# Patient Record
Sex: Male | Born: 1940
Health system: Southern US, Community
[De-identification: ages and names within clinical notes are randomized; demographics above are authoritative.]

## PROBLEM LIST (undated history)

## (undated) DIAGNOSIS — I2510 Atherosclerotic heart disease of native coronary artery without angina pectoris: Secondary | ICD-10-CM

## (undated) DIAGNOSIS — Z8711 Personal history of peptic ulcer disease: Secondary | ICD-10-CM

## (undated) DIAGNOSIS — M1990 Unspecified osteoarthritis, unspecified site: Secondary | ICD-10-CM

## (undated) DIAGNOSIS — K219 Gastro-esophageal reflux disease without esophagitis: Secondary | ICD-10-CM

## (undated) DIAGNOSIS — I10 Essential (primary) hypertension: Secondary | ICD-10-CM

## (undated) DIAGNOSIS — IMO0001 Reserved for inherently not codable concepts without codable children: Secondary | ICD-10-CM

## (undated) DIAGNOSIS — I639 Cerebral infarction, unspecified: Secondary | ICD-10-CM

## (undated) DIAGNOSIS — I6529 Occlusion and stenosis of unspecified carotid artery: Secondary | ICD-10-CM

## (undated) DIAGNOSIS — E119 Type 2 diabetes mellitus without complications: Secondary | ICD-10-CM

## (undated) DIAGNOSIS — W1781XA Fall down embankment (hill), initial encounter: Secondary | ICD-10-CM

## (undated) HISTORY — DX: Occlusion and stenosis of unspecified carotid artery: I65.29

## (undated) HISTORY — DX: Fall down embankment (hill), initial encounter: W17.81XA

## (undated) HISTORY — DX: Unspecified osteoarthritis, unspecified site: M19.90

## (undated) HISTORY — PX: JOINT REPLACEMENT: SHX530

## (undated) HISTORY — DX: Cerebral infarction, unspecified: I63.9

---

## 1999-07-07 HISTORY — PX: CORONARY ARTERY BYPASS GRAFT: SHX141

## 1999-07-09 ENCOUNTER — Inpatient Hospital Stay (HOSPITAL_COMMUNITY): Admission: EM | Admit: 1999-07-09 | Discharge: 1999-07-16 | Payer: Self-pay | Admitting: Internal Medicine

## 1999-07-11 ENCOUNTER — Encounter: Payer: Self-pay | Admitting: Surgery

## 1999-07-12 ENCOUNTER — Encounter: Payer: Self-pay | Admitting: Surgery

## 1999-07-13 ENCOUNTER — Encounter: Payer: Self-pay | Admitting: Surgery

## 2002-09-11 ENCOUNTER — Encounter: Payer: Self-pay | Admitting: Emergency Medicine

## 2002-09-11 ENCOUNTER — Observation Stay (HOSPITAL_COMMUNITY): Admission: EM | Admit: 2002-09-11 | Discharge: 2002-09-12 | Payer: Self-pay | Admitting: Emergency Medicine

## 2005-03-19 ENCOUNTER — Ambulatory Visit: Payer: Self-pay | Admitting: Internal Medicine

## 2005-04-17 ENCOUNTER — Ambulatory Visit: Payer: Self-pay

## 2005-07-06 HISTORY — PX: CAROTID ENDARTERECTOMY: SUR193

## 2006-02-04 ENCOUNTER — Inpatient Hospital Stay (HOSPITAL_COMMUNITY): Admission: EM | Admit: 2006-02-04 | Discharge: 2006-02-10 | Payer: Self-pay | Admitting: Emergency Medicine

## 2006-02-04 ENCOUNTER — Ambulatory Visit: Payer: Self-pay | Admitting: *Deleted

## 2006-02-05 ENCOUNTER — Encounter: Payer: Self-pay | Admitting: Vascular Surgery

## 2006-02-09 ENCOUNTER — Encounter: Payer: Self-pay | Admitting: Cardiology

## 2006-03-18 ENCOUNTER — Encounter (INDEPENDENT_AMBULATORY_CARE_PROVIDER_SITE_OTHER): Payer: Self-pay | Admitting: *Deleted

## 2006-03-18 ENCOUNTER — Ambulatory Visit (HOSPITAL_COMMUNITY): Admission: RE | Admit: 2006-03-18 | Discharge: 2006-03-20 | Payer: Self-pay | Admitting: Vascular Surgery

## 2006-10-01 ENCOUNTER — Ambulatory Visit: Payer: Self-pay | Admitting: Vascular Surgery

## 2007-03-30 ENCOUNTER — Ambulatory Visit: Payer: Self-pay | Admitting: Vascular Surgery

## 2008-03-28 ENCOUNTER — Ambulatory Visit: Payer: Self-pay | Admitting: Vascular Surgery

## 2009-04-03 ENCOUNTER — Ambulatory Visit: Payer: Self-pay | Admitting: Vascular Surgery

## 2009-09-07 ENCOUNTER — Inpatient Hospital Stay (HOSPITAL_COMMUNITY): Admission: EM | Admit: 2009-09-07 | Discharge: 2009-09-09 | Payer: Self-pay | Admitting: Emergency Medicine

## 2009-09-07 ENCOUNTER — Encounter (INDEPENDENT_AMBULATORY_CARE_PROVIDER_SITE_OTHER): Payer: Self-pay | Admitting: Internal Medicine

## 2010-04-03 ENCOUNTER — Ambulatory Visit: Payer: Self-pay | Admitting: Vascular Surgery

## 2010-09-29 LAB — LIPASE, BLOOD: Lipase: 22 U/L (ref 11–59)

## 2010-09-29 LAB — TYPE AND SCREEN: ABO/RH(D): O POS

## 2010-09-29 LAB — IRON AND TIBC
Iron: 35 ug/dL — ABNORMAL LOW (ref 42–135)
Saturation Ratios: 11 % — ABNORMAL LOW (ref 20–55)
TIBC: 333 ug/dL (ref 215–435)
UIBC: 298 ug/dL

## 2010-09-29 LAB — URINALYSIS, ROUTINE W REFLEX MICROSCOPIC
Nitrite: NEGATIVE
Protein, ur: NEGATIVE mg/dL
Specific Gravity, Urine: 1.016 (ref 1.005–1.030)
Urobilinogen, UA: 0.2 mg/dL (ref 0.0–1.0)
pH: 5.5 (ref 5.0–8.0)

## 2010-09-29 LAB — CBC
HCT: 26.9 % — ABNORMAL LOW (ref 39.0–52.0)
HCT: 29.5 % — ABNORMAL LOW (ref 39.0–52.0)
HCT: 29.6 % — ABNORMAL LOW (ref 39.0–52.0)
HCT: 31.1 % — ABNORMAL LOW (ref 39.0–52.0)
Hemoglobin: 10.3 g/dL — ABNORMAL LOW (ref 13.0–17.0)
Hemoglobin: 9.2 g/dL — ABNORMAL LOW (ref 13.0–17.0)
Hemoglobin: 9.8 g/dL — ABNORMAL LOW (ref 13.0–17.0)
Hemoglobin: 9.9 g/dL — ABNORMAL LOW (ref 13.0–17.0)
MCHC: 33.2 g/dL (ref 30.0–36.0)
MCHC: 33.6 g/dL (ref 30.0–36.0)
MCV: 91.1 fL (ref 78.0–100.0)
MCV: 91.9 fL (ref 78.0–100.0)
Platelets: 122 10*3/uL — ABNORMAL LOW (ref 150–400)
RBC: 3.25 MIL/uL — ABNORMAL LOW (ref 4.22–5.81)
RBC: 3.25 MIL/uL — ABNORMAL LOW (ref 4.22–5.81)
RDW: 14.1 % (ref 11.5–15.5)
RDW: 14.4 % (ref 11.5–15.5)
RDW: 14.4 % (ref 11.5–15.5)
RDW: 14.4 % (ref 11.5–15.5)
WBC: 8.2 10*3/uL (ref 4.0–10.5)

## 2010-09-29 LAB — BASIC METABOLIC PANEL
BUN: 18 mg/dL (ref 6–23)
CO2: 23 mEq/L (ref 19–32)
Calcium: 8.7 mg/dL (ref 8.4–10.5)
Chloride: 112 mEq/L (ref 96–112)
Creatinine, Ser: 1.15 mg/dL (ref 0.4–1.5)
GFR calc Af Amer: >60 mL/min (ref >60)
GFR calc non Af Amer: 60 mL/min — ABNORMAL LOW (ref >60)
Glucose, Bld: 126 mg/dL — ABNORMAL HIGH (ref 70–99)
Sodium: 139 mEq/L (ref 135–145)

## 2010-09-29 LAB — COMPREHENSIVE METABOLIC PANEL
AST: 14 U/L (ref 0–37)
Albumin: 3.1 g/dL — ABNORMAL LOW (ref 3.5–5.2)
Alkaline Phosphatase: 49 U/L (ref 39–117)
BUN: 49 mg/dL — ABNORMAL HIGH (ref 6–23)
CO2: 24 mEq/L (ref 19–32)
Calcium: 8.7 mg/dL (ref 8.4–10.5)
Chloride: 111 mEq/L (ref 96–112)
GFR calc Af Amer: >60 mL/min (ref >60)
GFR calc non Af Amer: 52 mL/min — ABNORMAL LOW (ref >60)
Glucose, Bld: 197 mg/dL — ABNORMAL HIGH (ref 70–99)
Potassium: 5.1 mEq/L (ref 3.5–5.1)

## 2010-09-29 LAB — CARDIAC PANEL(CRET KIN+CKTOT+MB+TROPI)
CK, MB: 2 ng/mL (ref 0.3–4.0)
Relative Index: INVALID (ref 0.0–2.5)
Total CK: 69 U/L (ref 7–232)
Total CK: 71 U/L (ref 7–232)

## 2010-09-29 LAB — POCT CARDIAC MARKERS
CKMB, poc: 1.1 ng/mL (ref 1.0–8.0)
CKMB, poc: 1.2 ng/mL (ref 1.0–8.0)
Myoglobin, poc: 88.4 ng/mL (ref 12–200)
Troponin i, poc: <0.05 ng/mL (ref 0.00–0.09)

## 2010-09-29 LAB — HEMOCCULT GUIAC POC 1CARD (OFFICE): Fecal Occult Bld: POSITIVE

## 2010-09-29 LAB — FERRITIN: Ferritin: 15 ng/mL — ABNORMAL LOW (ref 22–322)

## 2010-09-29 LAB — POCT I-STAT, CHEM 8
BUN: 50 mg/dL — ABNORMAL HIGH (ref 6–23)
Potassium: 5.1 mEq/L (ref 3.5–5.1)

## 2010-09-29 LAB — DIFFERENTIAL
Basophils Absolute: 0 10*3/uL (ref 0.0–0.1)
Basophils Relative: 0 % (ref 0–1)
Lymphocytes Relative: 15 % (ref 12–46)
Neutrophils Relative %: 77 % (ref 43–77)

## 2010-11-18 NOTE — Procedures (Signed)
CAROTID DUPLEX EXAM   INDICATION:  Follow up left carotid endarterectomy.   HISTORY:  Diabetes:  No.  Cardiac:  Yes, CABG in 2001.  Hypertension:  Yes.  Smoking:  No.  Previous Surgery:  Left carotid endarterectomy on March 18, 2006.  CV History:  Amaurosis Fugax No, Paresthesias No, Hemiparesis No.                                       RIGHT             LEFT  Brachial systolic pressure:         110               110  Brachial Doppler waveforms:         Biphasic          Biphasic  Vertebral direction of flow:        Antegrade         Antegrade  DUPLEX VELOCITIES (cm/sec)  CCA peak systolic                   84                75  ECA peak systolic                   169               [id number]  ICA peak systolic                   99                82  ICA end diastolic                   32                23  PLAQUE MORPHOLOGY:                  Heterogenous      Heterogenous  PLAQUE AMOUNT:                      Mild              Mild  PLAQUE LOCATION:                    ECA               ECA   IMPRESSION:  1. Normal carotid duplex noted bilaterally.  2. Status post left carotid endarterectomy.  3. Antegrade bilateral vertebral arteries.   ___________________________________________  Jessy Oto Fields, MD   MG/MEDQ  D:  03/28/2008  T:  03/28/2008  Job:  [id number]

## 2010-11-18 NOTE — Procedures (Signed)
CAROTID DUPLEX EXAM   INDICATION:  Followup evaluation of known coronary artery disease.   HISTORY:  Diabetes:  No  Cardiac:  Yes  Hypertension:  Yes  Smoking:  No  Previous Surgery:  Coronary artery bypass graft in 2001, left carotid  endarterectomy with Dacron patch angioplasty on March 18, 2006, by  Dr. Oneida Alar  CV History:  Patient reports no cerebrovascular symptoms at this time  Amaurosis Fugax No, Paresthesias No, Hemiparesis No                                       RIGHT             LEFT  Brachial systolic pressure:         108               110  Brachial Doppler waveforms:         Triphasic         Triphasic  Vertebral direction of flow:        Antegrade         Antegrade  DUPLEX VELOCITIES (cm/sec)  CCA peak systolic                   86                72  ECA peak systolic                   98                [id number]  ICA peak systolic                   55                53  ICA end diastolic                   23                22  PLAQUE MORPHOLOGY:                  Calcified         None  PLAQUE AMOUNT:                      Mild              None  PLAQUE LOCATION:                    ECA               None   IMPRESSION:  1. No right internal carotid artery stenosis.  2. No left internal carotid artery stenosis, status post      endarterectomy.  3. No significant change from previous study performed on October 01, 2006.     ___________________________________________  Jessy Oto. Fields, MD   MC/MEDQ  D:  03/30/2007  T:  03/31/2007  Job:  [id number]

## 2010-11-18 NOTE — Assessment & Plan Note (Signed)
OFFICE VISIT   RHYKER, METH  DOB:  [birth year]                                       03/30/2007  CHART#:[id number]   Mr. Handke returns for followup today after his left carotid  endarterectomy in September, 2007.  He has had no further symptoms of  stroke, TIA or amaurosis since his operation.  States his blood pressure  has been well controlled.   PHYSICAL EXAMINATION:  VITAL SIGNS:  Blood pressure 135/78 in the left  arm, 125/76 in the right arm.  HEENT:  Unremarkable.  NECK:  Neck incision is well healed.  He has no carotid bruits.  CHEST:  Clear to auscultation.  CARDIAC:  Regular rate and rhythm without murmur.  ABDOMEN:  Soft, nontender, no pulsatile mass.  EXTREMITIES:  He has 2+ femoral pulses.   He had a carotid duplex exam today, which showed no significant left or  right internal carotid artery stenosis.  Overall, Mr. Elick is doing  well.  We will place him on a carotid protocol to make sure that he has  no further re-narrowing over time.  He will continue his aspirin therapy  and continue risk factor modification.   Jessy Oto. Fields, MD  Electronically Signed   CEF/MEDQ  D:  03/31/2007  T:  03/31/2007  Job:  [id number]   cc:   Jerelene Redden, MD

## 2010-11-18 NOTE — Procedures (Signed)
CAROTID DUPLEX EXAM   INDICATION:  Follow up carotid artery disease.   HISTORY:  Diabetes:  No.  Cardiac:  CABG in 2001.  Hypertension:  Yes.  Smoking:  No.  Previous Surgery:  Left CEA on 03/18/06.  CV History:  Multiple TIAs before CEA.  Amaurosis Fugax No, Paresthesias No, Hemiparesis No.                                       RIGHT             LEFT  Brachial systolic pressure:         128               117  Brachial Doppler waveforms:         WNL               WNL  Vertebral direction of flow:        Antegrade         Antegrade  DUPLEX VELOCITIES (cm/sec)  CCA peak systolic                   97                76  ECA peak systolic                   105               [id number]  ICA peak systolic                   84                78  ICA end diastolic                   29                19  PLAQUE MORPHOLOGY:                  Intimal thickening  PLAQUE AMOUNT:                      Mild              None  PLAQUE LOCATION:                    Bifurcation   IMPRESSION:  1. No evidence of internal carotid artery stenosis bilaterally.  2. No significant changes from previous study.    ___________________________________________  Jessy Oto Fields, MD   AS/MEDQ  D:  04/03/2009  T:  04/03/2009  Job:  [id number]

## 2010-11-18 NOTE — Assessment & Plan Note (Signed)
OFFICE VISIT   Brad, Day  DOB:  12-Jul-1940                                       04/03/2010  CHART#:[id number]   CHIEF COMPLAINT:  Carotid stenosis.   HISTORY OF PRESENT ILLNESS:  The patient is a 70 year old male who  previously underwent left carotid endarterectomy in September of 2007.  He has done well since then.  He returns today for further followup.  He  has been getting intermittent surveillance carotid duplex ultrasound  which has showed no evidence of recurrent stenosis.   Chronic medical problems include coronary artery disease, hypertension,  all these problems are currently controlled and followed by Dr. Maceo Pro.   Past medical history is otherwise unremarkable.   PAST SURGICAL HISTORY:  He had carotid endarterectomy and coronary  artery bypass grafting in 2001.   He currently denies any symptoms of TIA, amaurosis or stroke.  He is  currently not on aspirin secondary to GI bleeding from an ulcer.   SOCIAL HISTORY:  He currently works part-time for [employer].  He is married.  He is a former  smoker, quit in 1978.  He does not consume alcohol regularly.   FAMILY HISTORY:  Remarkable for his mother who had a stroke, father who  had coronary disease, sisters and brothers all of which required  coronary artery bypass grafting.   REVIEW OF SYSTEMS:  Full 12 point review of systems was performed with  the patient today.  Please see intake referral form for details  regarding this.   PHYSICAL EXAM:  Vital signs:  Blood pressure is 140/88 in the left arm,  152/81 in the right arm, heart rate 67 and regular.  HEENT:  Unremarkable.  Neck:  Has 2+ carotid pulses without bruit.  Chest:  Clear to auscultation.  Cardiac:  Exam is regular rate and rhythm  without murmur.  Abdomen:  Soft, nontender, nondistended, slightly  obese.  No masses.  Extremities:  He has 2+ radial and femoral pulses  bilaterally.  Musculoskeletal:  Shows no major joint deformities.  Neurologic:  Shows symmetric upper extremity and lower extremity motor  strength which is 5/5.  Skin:  Has no open ulcers or rashes.   He had a carotid duplex exam performed today which showed no significant  ICA stenosis bilaterally.  This is unchanged from previous.   At this point the patient has no evidence of recurrent stenosis from  September of 2007.  He will continue in carotid surveillance and we will  repeat his carotid duplex exam in one year's time.  He will return  sooner if he develops any symptoms.     Jessy Oto. Fields, MD  Electronically Signed   CEF/MEDQ  D:  04/03/2010  T:  04/04/2010  Job:  [id number]   cc:   Herbie Baltimore L. Maceo Pro, M.D.

## 2010-11-18 NOTE — Procedures (Signed)
CAROTID DUPLEX EXAM   INDICATION:  Carotid artery disease.   HISTORY:  Diabetes:  No.  Cardiac:  Open heart surgery in 2001.  Hypertension:  Yes.  Smoking:  No.  Previous Surgery:  Left carotid endarterectomy, 03/18/2006.  CV History:  Asymptomatic.  Amaurosis Fugax , Paresthesias , Hemiparesis                                       RIGHT             LEFT  Brachial systolic pressure:         133               128  Brachial Doppler waveforms:         Within normal limits                Within normal limits  Vertebral direction of flow:        Antegrade         Antegrade  DUPLEX VELOCITIES (cm/sec)  CCA peak systolic                   92                76  ECA peak systolic                   116               [id number]  ICA peak systolic                   98                82  ICA end diastolic                   28                37  PLAQUE MORPHOLOGY:                  Smooth  PLAQUE AMOUNT:                      Minimal           None  PLAQUE LOCATION:                    Bifurcation   IMPRESSION:  1. No evidence of internal carotid artery stenosis bilaterally.  2. No significant changes from previous study.   ___________________________________________  Jessy Oto Fields, MD   EM/MEDQ  D:  04/03/2010  T:  04/03/2010  Job:  [id number]

## 2010-11-21 NOTE — H&P (Signed)
Brad Day, SLIDER NO.:  [id number]   MEDICAL RECORD NO.:  [id number]          PATIENT TYPE:  INP   LOCATION:  NA                           FACILITY:  [facility]   PHYSICIAN:  Brad E. Fields, MD  DATE OF BIRTH:  [birth year]   DATE OF ADMISSION:  DATE OF DISCHARGE:                                HISTORY & PHYSICAL   CHIEF COMPLAINT:  Recent cerebrovascular accident with left-sided  extracranial cerebrovascular arterial occlusive disease.   HISTORY OF PRESENT ILLNESS:  The patient is a 70 year old white male  referred to Dr. Oneida Alar in consultation during a recent hospitalization where  he presented with chest pain and a cerebrovascular accident. During his  evaluation, a carotid Duplex scan revealed a significant 80-99% left  internal carotid artery stenosis. The head CT revealed a left frontal and  occipital cerebrovascular accident. He has made a full recovery and  currently is essentially asymptomatic with the exception of occasional mild  headaches. His primary symptom during the cerebrovascular accident was a  Broca's expressive aphasia. The symptoms have, as stated, fully resolved. It  was Dr. Oneida Alar' opinion as the vascular surgery consultant that he would  best be served by proceeding with a left carotid endarterectomy so as to  lower his risk of cerebrovascular accident. Initially he was placed on  Plavix but this has been discharged on preparation for the surgery. He  stopped this last Friday. He will be admitted this hospitalization for the  procedure.   PAST MEDICAL HISTORY:  Extracranial cerebrovascular occlusive disease with  cerebrovascular accident as described above.   OTHER DIAGNOSES:  1. Coronary artery disease.  2. Hypertension.  3. Hyperlipidemia.  4. Borderline diabetes mellitus for which he takes no medications.   PAST SURGICAL HISTORY:  Significant for coronary artery bypass grafting x5  in 2001 by Dr. Gilford Raid.   ALLERGIES:  No  known drug allergies.   CURRENT MEDICATIONS:  1. Lisinopril 5 mg daily.  2. Toprol XL 100 mg daily.  3. Avecor 750/20 mg 2 tablets daily.  4. Aspirin three 81 mg tablets daily.   REVIEW OF SYMPTOMS:  See the history of the present illness for the  pertinent positives and negatives otherwise is remarkable for arthritis  symptoms primarily in his knees. He does get occasion dyspnea on exertion.  Note, he has had no recent chest pain. Other review is unremarkable.   SOCIAL HISTORY:  He is married with 3 children. He is a remote smoker having  quit in 1978. His use was approximately 1 pack per day for 20 years. Alcohol  use none.   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   FAMILY HISTORY:  Father deceased age 50 from heart disease. His sister had  coronary artery bypass grafting. He has 1 brother who had a recent carotid  endarterectomy by Dr. Oneida Alar followed also recently by coronary artery  bypass graft by Dr. Roxy Manns.   PHYSICAL EXAMINATION:  VITAL SIGNS:  Blood pressure 108/68, heart  rate 76,  respirations 12.  GENERAL:  This is a 70 year old Caucasian male in no acute distress.  HEENT:  Normocephalic, atraumatic. Pupils equal round and reactive to light.  Extraocular movements intact. Oral mucosa is pink, sclera is nonicteric.  Pharynx is clear of exudates or erythema.  NECK:  Supple, no jugular venous distention. He has had left carotid bruit.  No lymphadenopathy, no thyromegaly.  PULMONARY:  Symmetrical on inspiration, clear breath sounds without wheezes,  rhonchi or crackles.  CARDIAC:  Regular rate and rhythm, no murmur, gallop or rub.  ABDOMEN:  Soft, nontender, nondistended. Obese, normal active bowel sounds.  No obvious bruits. No definitive hepatosplenomegaly to palpation.  GENITOURINARY/RECTAL:  Deferred.  EXTREMITIES:  No edema, no varicosities, no venous stasis changes, no skin   breakdown or ulcerations. He has a well healed, right-sided lower extremity  venectomy scar. No clubbing, no cyanosis, feet are warm. Peripheral pulses  are equal and intact bilaterally.  NEUROLOGIC:  Nonfocal. Alert and oriented x4. Gait is steady. No evidence of  lateralizing symptoms. Muscle strength is 5+ and equal bilateral. Cranial  nerves II-XII are grossly intact.   ASSESSMENT:  Recent cerebrovascular accident by CT scan which is fully  resolved clinically. He does have a severe 80-99% left internal carotid  artery stenosis.   PLAN:  Left carotid endarterectomy per Dr. Oneida Alar on March 18, 2006.      John Giovanni, P.A.-C.      Jessy Oto. Fields, MD  Electronically Signed    WEG/MEDQ  D:  03/16/2006  T:  03/16/2006  Job:  [id number]   cc:   Herbie Baltimore L. Maceo Pro, M.D.  Champ Mungo. Lovena Le, MD

## 2010-11-21 NOTE — Consult Note (Signed)
NAMEFORBES, PARZIALE NO.:  [id number]   MEDICAL RECORD NO.:  [id number]          PATIENT TYPE:  INP   LOCATION:  2008                         FACILITY:  [facility]   PHYSICIAN:  Scarlett Presto, M.D.   DATE OF BIRTH:  [birth year]   DATE OF CONSULTATION:  02/05/2006  DATE OF DISCHARGE:                                   CONSULTATION   PRIMARY CARE PHYSICIAN:  Dr. Maceo Pro   PRIMARY CARDIOLOGIST:  Dr. Cristopher Peru   CHIEF COMPLAINT:  Chest pain.   HISTORY OF PRESENT ILLNESS:  Brad Day is a 70 year old male with a  previous history of coronary artery disease.  He had onset of left shoulder  and left chest and abdominal pain on Wednesday, February 03, 2006.  It was mild  at a 2/10.  He had no associated symptoms.  Yesterday he also had a headache  which reached a 4/10.  He was still having episodic chest pain.  He also  complained of fatigue.  He denied shortness of breath, diaphoresis, nausea  or vomiting.  He was having multiple episodes of the chest pain daily.  There were no aggravating or alleviating symptoms that he can remember.  When he did not feel like getting out of bed because of the headache, the  fatigue and the pain, his wife contact his primary physician who advised  that he should be checked.  She took him to a local fire department where  his blood pressure was elevated at 175/100.  He came to the emergency room.  Once he was in the emergency room his blood pressure improved to 147/88.  At  that time his chest pain and headache resolved.  He has been symptom-free  since then.  Until Wednesday he has had no recent symptoms.  He does not  exercise regularly but says his job is fairly active.   PAST MEDICAL HISTORY:  1.  Status post aortocoronary bypass surgery January 2001 with LIMA to LAD,      SVG to D-1, and ramus intermedius and SVG to OM-1 and OM-2.  2.  Hypertension.  3.  Hyperlipidemia.  4.  Family history of coronary artery disease.  5.   Reportedly told he was a borderline diabetic  6.  Status post Myoview October 2006 with no ischemia, EF 63%.  7.  Status post admission for chest pain in March 2004 with a negative      stress test that time.  8.  Osteoarthritis.   SURGICAL HISTORY:  He is status post cardiac catheterization and bypass  surgery but no other procedures.   ALLERGIES:  No known drug allergies.   MEDICATIONS:  1.  Aspirin 81 mg a day.  2.  Toprol-XL 100 mg a day.  3.  Advicor 750/20 two tablets daily.  4.  Aleve two tablets daily prior to admission.  5.  Currently on Lovenox 40 mg a day.   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  He quit tobacco 30 years ago and does not abuse  alcohol or drugs.   FAMILY HISTORY:  His mother died at age 86 of a CVA but no coronary artery  disease.  His father died at age 42 after having multiple Mis, the first one  either in his late 67s or early 47s.  He has three siblings who have had  bypass surgery.   REVIEW OF SYSTEMS:  Significant for chest pain described above.  He has some  chronic dyspnea on exertion which has not changed recently.  He denies  orthopnea, PND, edema or palpitations.  He has had no presyncope or syncope.  He denies coughing or wheezing.  He has arthralgias in his knees.  He has  rare reflux symptoms and takes Tums about once a month.  He had diarrhea on  Wednesday, but otherwise no GI symptoms.  Review of systems is otherwise  negative.   PHYSICAL EXAMINATION:  VITAL SIGNS:  Temperature is 97.2, blood pressure  159/98, pulse 70, respiratory rate 20, O2 saturation 96% on room air.  GENERAL:  He is a well-developed, obese white male in no acute distress.  HEENT:  His head is normocephalic and atraumatic.  Pupils equal, round, and  react to light and accommodation.  Extraocular movements intact.  Sclerae  clear, nares without discharge.  NECK:  There is no lymphadenopathy, thyromegaly or JVD  noted.  There is a  bruit noted on the left that is high-pitched.  CARDIOVASCULAR:  His heart is regular in rate and rhythm with an S1, S2, and  a soft murmur at left upper sternal border.  His distal pulses are 2+ in all  extremities except for the right DP which is slightly decreased but no  femoral bruits are appreciated.  LUNGS:  Are clear to auscultation bilaterally.  SKIN:  No rashes or lesions are noted.  ABDOMEN:  Is soft and nontender with active bowel sounds.  EXTREMITIES:  There is no cyanosis, clubbing or edema noted.  MUSCULOSKELETAL:  No joint deformity or effusions.  NEUROLOGIC:  He is alert and oriented with cranial nerves II-XII grossly  intact.   Chest x-ray shows mild cardiac enlargement with no acute disease.  EKG:  Sinus rhythm, rate 66, with no acute ischemic changes and no significant  change from an EKG dated March 9, although the year is unreadable.   LABORATORY VALUES:  Total cholesterol 195, triglycerides 285, HDL 31, LDL  107.  Sodium 140, potassium 3.9, chloride 110, glucose 109.  Hemoglobin  14.7, hematocrit 43.4, wbc's 8.8, platelets 163.  CK-MB 295/6.3, then  308/7.0 within a normal index.  Troponin I negative x2.  Point-of-care  markers negative x2 except for increased myoglobin.   IMPRESSION:  Chest pain:  It is atypical but his pre-bypass symptoms were  atypical as well.  It has been 6 years since he had a catheterization and  although his last functional study a year ago was without ischemia his  cardiac risk factors have not been well controlled.  Because he has had both  resting and exertional symptoms, consider cardiac catheterization to  evaluate.  Dr. Wilhemina Cash discussed the indications, alternatives, and likely  outcomes as well as the  risks of a left heart catheterization and percutaneous intervention with Mr.  Day.  He indicated understanding and was in agreement to proceeding with this as planned.  This will be scheduled at the earliest  possible time.  Additionally, we will check hemoglobin A1c because of his elevated  triglycerides and blood sugars.  Rosaria Ferries, P.A. [facility]      Scarlett Presto, M.D.  Electronically Signed    RB/MEDQ  D:  02/05/2006  T:  02/05/2006  Job:  [id number]

## 2010-11-21 NOTE — H&P (Signed)
NAME:  Brad Day, Brad Day NO.:  [id number]   MEDICAL RECORD NO.:  [id number]                   PATIENT TYPE:  INP   LOCATION:  [id number]                                 FACILITY:  [facility]   PHYSICIAN:  Jenkins Rouge, M.D. [facility]              DATE OF BIRTH:  08-Apr-1941   DATE OF ADMISSION:  09/11/2002  DATE OF DISCHARGE:                                HISTORY & PHYSICAL   REASON FOR ADMISSION:  Twenty-four-hour observation, admitted for chest  pain.   HISTORY OF PRESENT ILLNESS:  The patient is a 70 year old patient of Dr.  Herbie Baltimore L. Maceo Pro and Dr. Champ Mungo. Lovena Le.  He had bypass three years ago by  Dr. Gilford Raid and unfortunately, there are no records available at this  time; the ER misplaced his old chart.   The patient tells me that he initially presented to the emergency room three  years ago with chest pain and had a heart cath, did not suffer myocardial  infarction and underwent CABG in the same admission.   Since that time, he has not been rehospitalized.  He quit smoking back in  the mid-1970s.  He is being treated for high blood pressure.  His Toprol was  just increased from 50 to 100 mg in the last week or two.  He is on Lescol  80 mg a day for hypercholesterolemia and takes baby aspirin a day.   He last saw Dr. Lovena Le about a year and a half ago.  He has not had a stress  test in the last year and a half to two years.  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   Apparently, he was due to see Dr. Lovena Le in February but Dr. Maceo Pro had just  seen him and he was to reschedule but the patient in general has not had any  significant exertional chest pain.  Yesterday, he had some pain starting in  his left foot.  It went up the left side of his body and settled in his  chest.  He had persistent chest pain radiating to the left arm and  came to  the emergency room.  In the emergency room, he had an equivocal response to  nitroglycerin and after being in the ER for about a hour, he is currently  pain-free except for some left arm pain.  Again, his pain has not been  typical of angina, it is not exertional and in general, he had been feeling  fine up until a day ago.   REVIEW OF SYSTEMS:  Review of systems is remarkable for no other GI  complaints.  He does have some arthritis in his knees for which he takes  Aleve.   FAMILY HISTORY:  Family history is positive for coronary artery disease on  the father's side.   SOCIAL HISTORY:  He is still working at the distribution center.  He enjoys  hunting and is otherwise sedentary.   MEDICATIONS:  1. Lescol 80 mg a day.  2. Aleve two tablets a day.  3. A baby aspirin a day.  4. Toprol 100 mg a day.   ALLERGIES:  He has no known allergies.   PHYSICAL EXAMINATION:  GENERAL:  On examination, he is overweight.  VITAL SIGNS:  Blood pressure is 114/70 after nitroglycerin.  Pulse is 64 and  regular.  LUNGS:  Lungs are clear.  CARDIAC:  Carotids are normal.  There is an S1 and S2 with normal heart  sounds.  ABDOMEN:  Abdomen is fairly benign.  He does have some mild tenderness in  the left lower quadrant but good bowel sounds and no palpable aneurysm.  EXTREMITIES:  Distal pulses are intact with no edema.   LABORATORY AND ACCESSORY CLINICAL DATA:  EKG shows sinus rhythm at a rate of  64, left anterior fascicular block, no acute changes.   Chest x-ray is benign.   Labs are pending but admission CPK is negative.   IMPRESSION:  The patient's pain is not typical for angina.  He had been well  up until yesterday.  He is due to have a stress test.  He is a nonsmoker and  his other risk factors have been well-controlled.  I think it is prudent to  admit him for a 24-hour observation, place him on 2 and monitor for signs of  recurrent pain.  If he is doing well in the morning,  he can be discharged  for same-day or next-day outpatient Cardiolite study in an office.   We will continue his current medications as Dr. Maceo Pro has adjusted them  recently.                                               Jenkins Rouge, M.D. [facility]    PN/MEDQ  D:  09/12/2002  T:  09/12/2002  Job:  [id number]

## 2010-11-21 NOTE — Discharge Summary (Signed)
NAMEWESTYN, SKOUSEN NO.:  [id number]   MEDICAL RECORD NO.:  [id number]          PATIENT TYPE:  OIB   LOCATION:  2006                         FACILITY:  [facility]   PHYSICIAN:  Jessy Oto. Fields, MD  DATE OF BIRTH:  [birth year]   DATE OF ADMISSION:  03/18/2006  DATE OF DISCHARGE:  03/20/2006                                 DISCHARGE SUMMARY   HISTORY OF PRESENT ILLNESS:  The patient is a 70 year old white male  referred to Dr. Oneida Alar in consultation during a recent hospitalization where  he presented with chest pain and an cerebrovascular accident.  During his  evaluation, a carotid duplex scan revealed a significant 80 to 99% left  internal carotid artery stenosis.  A head CT revealed a left frontal and  occipital cerebrovascular accident.  He has made a full recovery and  currently is essentially asymptomatic with the exception of occasional mild  headaches.  His primary symptom during this cerebrovascular accident was a  Broca's expressive aphasia.  These symptoms have as stated fully resolved.  It was Dr. Nona Dell opinion as the vascular surgery consultant that he would  best be served by proceeding with a left carotid endarterectomy so as to  lower his risk of cerebrovascular accident.  Initially he was placed on  Plavix but this continued in preparation for this surgery.  He stopped this  on the Friday previous to admission.  He was admitted as hospitalization for  the procedure.  Past medical history includes extracranial cerebrovascular  occlusive disease with cerebrovascular accident as described above.  Other  diagnoses include coronary artery disease, hypertension, hyperlipidemia,  borderline diabetes mellitus for which he takes no medication.   PAST SURGICAL HISTORY:  Coronary artery bypass grafting x5 in 2001 by Dr.  Arvid Right.   ALLERGIES:  No known drug allergies.   MEDICATIONS PRIOR TO ADMISSION:  1. Lisinopril 5 mg daily.  2. Toprol XL 100 mg  daily.  3. Advicor 750/20 mg 2 tablets daily.  4. Aspirin three 81-mg tablets daily.   FAMILY HISTORY, SOCIAL HISTORY, REVIEW OF SYSTEMS, AND PHYSICAL EXAM:  Please see the history and physical done at time of admission.   HOSPITAL COURSE:  Patient was admitted electively and on March 18, 2006,  he was taken to the operating room and underwent the following procedure:  left carotid endarterectomy with Dacron patch angioplasty.  The patient  tolerated the procedure well and was taken to the postanesthesia care unit  in stable condition.   POSTOPERATIVE HOSPITAL COURSE:  The patient was noted in the postanesthesia  care unit to have a left-sided deviation of his tongue consistent with a  hypoglossal nerve stretch injury.  He additionally has had some difficulty  with swallowing.  He has been seen by speech therapy and they have made  recommendations regarding swallow including a dysphasia 3 diet with thin  liquids and a chin tuck maneuver when drinking.  Medicine can be placed in  the pureed food whole and he is also recommended reflux precautions.  Otherwise, the patient has been neurologically intact without other  focal  deficits.  He is advanced in the routine and manner in regard to activities  using the standard postoperative protocols.  His incision is healing well  without evidence of infection or difficulty with bleeding/hematoma.  Oxygen  has been weaned and he maintains good saturations on room air.  He does have  some mildly elevated capillary blood glucoses consistent with his diagnoses  and is instructed to follow up with his primary physician regarding this.  Overall, the patient's status was felt to be stable for discharge on  March 20, 2006.  Condition on discharge is stable and improving.   INSTRUCTIONS:  The patient received written instructions in regard to  medications, activity, diet, wound care, and followup.  Followup will  include Dr. Oneida Alar Friday,  September 28th at 2:30 p.m.  Medications on  discharge were as preoperatively.  Additionally for pain Tylox 1 every 4 to  6 hours as needed.   FINAL DIAGNOSES:  1. Severe left internal carotid artery stenosis now status post carotid      endarterectomy.  2. History of previous cerebrovascular accident as described.  3. Other diagnoses include coronary artery disease, hypertension,      hyperlipidemia, borderline diabetes mellitus, and also postoperative      hypoglossal stretch injury.      John Giovanni, P.A.-C.      Jessy Oto. Fields, MD  Electronically Signed    WEG/MEDQ  D:  03/20/2006  T:  03/20/2006  Job:  [id number]   cc:   Jessy Oto. Oneida Alar, MD  Jaymes Graff. Maceo Pro, M.D.  Champ Mungo. Lovena Le, MD

## 2010-11-21 NOTE — Op Note (Signed)
[facility]  Patient:    Brad Day                         MRN: [id number] Proc. Date: 07/11/99 Adm. Date:  [id number] Attending:  Valla Leaver                           Operative Report  PREOPERATIVE DIAGNOSIS:  Severe two-vessel coronary artery disease with unstable angina.  POSTOPERATIVE DIAGNOSIS:  Severe two-vessel coronary artery disease with unstable angina.  OPERATIVE PROCEDURE:  Median sternotomy, extracorporeal circulation, coronary artery bypass graft surgery x 5 using a left internal mammary artery graft to the left anterior descending coronary artery with a sequential saphenous vein graft to the first diagonal branch of the left anterior descending and the first intermediate coronary artery, and a sequential saphenous vein graft to the second intermediate coronary artery and the obtuse marginal branch of the left circumflex coronary artery.  ATTENDING SURGEON:  Gaye Pollack, M.D.  ASSISTANT:  Shelle Iron, P.A.  ANESTHESIA:  General endotracheal.  CLINICAL HISTORY:  This patient is a 70 year old white male with no prior cardiac history, who reported several-week history of exertional shortness of breath and fatigue, who now presents with unstable anginal symptoms.  Cardiac catheterization shows severe two-vessel disease.  The LAD had a proximal aneurysmal segment with 70% stenosis that was complex.  There is a medium-size diagonal branch that took off in this area and a 90% ostial stenosis.  The intermediate was a branching artery and both subbranches had 70% stenoses.  The obtuse marginal branch also ad about 70% stenosis.  The right coronary artery was a non-dominant vessel and had slight irregularity in the midportion but no significant stenosis.  Left ventricular function was normal.  After reviewing the angiogram and examination of the patient, it was felt that coronary artery bypass graft  surgery was the best  treatment.  I discussed the operative procedure, alternatives to surgery, benefits and risks including bleeding, possible blood transfusion, infection, stroke, myocardial infarction and death with the patient and his wife and they understood and agreed to proceed with surgery.  DESCRIPTION OF PROCEDURE:  The patient was taken to the operating room and placed on the table in supine position.  After induction of general endotracheal anesthesia, a Foley catheter was placed in the bladder using sterile technique.  Then the chest, abdomen and both lower extremities were prepped and draped in the usual sterile manner.  The chest was entered through a median sternotomy incision and the pericardium opened in the midline.  Examination of the heart showed good ventricular contractility.  The ascending aorta had no palpable plaques in it.  Then the left internal mammary artery was harvested from the chest wall as a pedicle graft; this was a medium-caliber vessel with excellent blood flow through it.  At the same time, a segment of greater saphenous vein was harvested from the right lower leg and this vein was of medium size and good quality.  Then the patient was heparinized and when an active activated clotting time was  achieved, the distal ascending aorta was cannulated using a 6.5-mm aortic cannula for arterial inflow.  Venous outflow was achieved using a two-stage venous cannula through the right atrial appendage.  An antegrade cardioplegia and vent cannula was inserted in the aortic root.  The patient was placed on cardiopulmonary bypass and the distal  coronaries identifies.  The LAD was a large graftable vessel.  The first diagonal branch was a medium-size graftable vessel.  The two branches of the intermediate coronary artery were both intramyocardial but were medium-size vessels.  The obtuse marginal was a medium-size vessel.  Then the aorta was  cross-clamped and 500 cc of cold blood antegrade cardioplegia was administered in the aortic root, with quick arrest of the heart.  Systemic hypothermia to 20 degrees centigrade and topical hypothermia with iced saline was used.  A temperature probe was placed in the septum and insulating pad in the pericardium.  The first distal anastomosis was performed to the second intermediate artery. he internal diameter was 1.6 mm.  The conduit that was used was a segment of greater saphenous vein and anastomosis performed in a sequential side-to-side manner using continuous 7-0 Prolene suture.  Flow was measured through the graft and was excellent.  The second distal anastomosis was performed to the obtuse marginal artery.  The  internal diameter was 1.6 mm.  The conduit that was used was the same segment of greater saphenous vein and the anastomosis performed in a sequential end-to-side manner using continuous 7-0 Prolene suture.  Flow was measured through the graft and was excellent.  Then a dose of cardioplegia was given down this vein graft nd in the aortic root.  The third distal anastomosis was performed to the diagonal branch.  The internal diameter was 1.6 mm.  The conduit that was used was a second segment of greater  saphenous vein and the anastomosis performed in a sequential side-to-side manner using continuous 7-0 Prolene suture.  Flow was measured through the graft and was excellent.  The fourth distal anastomosis was performed to the first intermediate artery. he internal diameter was 1.6 mm.  The conduit that was used was the same segment of greater saphenous vein and the anastomosis performed in a sequential end-to-side manner using continuous 7-0 Prolene suture.  Flow was measured through the graft and was excellent.  Then another dose of cardioplegia was given down the vein grafts and in the aortic root.  The fifth distal anastomosis was performed to the  midportion of the left anterior descending coronary artery.  The internal diameter was 1.75 mm.  The conduit that was used was the left internal mammary artery and this was brought through an  opening in the left pericardium anterior to the phrenic nerve.  It was anastomosed to the LAD in an end-to-side manner using continuous 8-0 Prolene suture.  The pedicle was tacked to the epicardium with 6-0 Prolene sutures.  The patient was  rewarmed to 37 degrees centigrade and the clamp removed from the mammary pedicle. There was rapid warming of the ventricular septum and return of spontaneous ventricular fibrillation.  The cross-clamp was removed with a time of 76 minutes and the patient defibrillated into sinus rhythm.  A partial-occlusion clamp was placed in the aortic root and the two proximal vein graft anastomoses were performed in end-to-side manner using continuous 6-0 Prolene suture.  The clamp was removed, the vein grafts de-aired and the clamps removed  from them.  The proximal and distal anastomoses appeared hemostatic and the lines of the graft satisfactory.  Graft marker was placed around the proximal anastomosis.  Two temporary right ventricular and right atrial pacing wires were placed and brought out through the skin.  When the patient had rewarmed to 37 degrees centigrade, he was weaned from cardiopulmonary bypass on no inotropic agents.  Total bypass  time was 113 minutes. Cardiac function appeared excellent, with a cardiac output of 7 L/min. Protamine was given and the venous and aortic cannulae were removed without difficulty. Hemostasis was achieved.  Three chest tubes were placed, with a tube in the posterior pericardium, one in the left pleural space and one in the anterior mediastinum.  The pericardium was reapproximated over the heart.  The sternum was closed with #6 stainless steel wires.  The fascia was closed with a continuous 1 Vicryl suture.   Subcutaneous tissue was closed using continuous 2-0 Vicryl and he skin with 3-0 Vicryl subcuticular closure.  Lower extremity vein harvest site was closed in layers in a similar manner.  Sponge, needle and instrument counts were correct according to the scrub nurse.  Dry sterile dressings were applied over he incisions, around the chest tubes, which were hooked to Pleur-evac suction. The patient remained hemodynamically stable and was transported to the SICU in guarded but stable condition. DD:  07/14/99 TD:  07/14/99 Job: [id number]

## 2010-11-21 NOTE — Consult Note (Signed)
NAMETIMOFEI, EMBERY NO.:  [id number]   MEDICAL RECORD NO.:  [id number]          PATIENT TYPE:  INP   LOCATION:  2008                         FACILITY:  [facility]   PHYSICIAN:  Shaune Pascal. [place], M.D.DATE OF BIRTH:  [birth year]   DATE OF CONSULTATION:  DATE OF DISCHARGE:                                   CONSULTATION   REASON FOR CONSULTATION:  Stroke.   HISTORY OF PRESENT ILLNESS:  Mr. Purohit is a 70 year old Caucasian male  with multiple medical problems who presented last week with aphasia that  lasted 4-5 hours and gradually improved to where now patient is back to  baseline.  The patient stated last Tuesday, he developed a left sided  headache which persisted until Wednesday when he developed difficulty  expressing words.  He also stated he woke up Wednesday morning with some  chest pain.  On Thursday, once again, he developed left sided headache and  speech difficulty that also resolved and was brought to the emergency room.  He denied any symptoms of numbness, weakness, vision changes, dizziness,  vertigo, swallowing problems, chewing problems, as well as loss of  consciousness.  He denies any problems with comprehension during this time  as well.   PAST MEDICAL HISTORY:  Positive for CAD, hypertension, high cholesterol.   CURRENT MEDICATIONS:  Includes aspirin, Advicor and Toprol.   ALLERGIES:  THE PATIENT HAS NO KNOWN DRUG ALLERGIES.   FAMILY HISTORY:  Positive for heart disease and stroke.   SOCIAL HISTORY:  The patient lives with his wife.  Denies any smoking or  alcohol use.   REVIEW OF SYSTEMS:  Negative.  As per HPI and greater than 7 other organ  systems.   EXAMINATION:  VITALS:  Temperature is 97.8.  Blood pressure is 113/69.  Pulse is 84.  Respirations 20.  O2 sat is 95-99%.  HEENT:  Normocephalic, atraumatic.  Extraocular muscles are intact.  Pupils  are equal and round.  NECK:  Supple.  There is left carotid bruit noticed.  HEART:   Regular.  LUNGS:  Clear.  ABDOMEN:  Soft and nontender.  EXTREMITIES:  Show no edema with good pulses.  NEUROLOGICAL EXAMINATION:  The patient is awake, alert and oriented x3.  Language is fluent.  Memory appears within normal.  This patient follows  commands appropriately.  Cranial nerves II-XII are grossly intact.  Motor  examination shows 5/5 strength and normal tone in all 4 extremities.  No  drift is noted.  Sensory examination is within normal limits.  Light touch  and reflexes are trace throughout.  Cerebella function is within normal  limits.  Finger-to-nose and heel-to-chin.  Gait is slightly wide.  Base is  steady.  The patient has a negative Romberg sign.   LABS:  WBC is 7.6, hemoglobin 14.8, hematocrit is 43.2, platelets 156,000,  PT is 13.2, INR is 1.0, PTT 34, sodium is 141, potassium 4.2, chloride is  106, CO2 is 29, BUN 18, creatinine 1.3, glucose is 144, calcium is 9.2, LFTs  are normal.  Hemoglobin A1c is 6.4.  total cholesterol is 185,  triglycerides  285 and LDL is 107.   A CT of the head showed hyperdensity in left frontal lobe.  MRI of the brain  with and without contrast showed a left parasagittal posterior frontal  infarct and then occipital infarct.  Carotid Dopplers showed left ICA 80%  stenosis.   IMPRESSION/PLAN:  This is a 69 year old with left sided headache and aphasia  and new left frontal stroke with left internal carotid artery stenosis.  Studies are reviewed and agree with future left carotid endarterectomy as  symptomatic for internal carotid artery stenosis.  We will change his  aspirin to Plavix as the patient has failed aspirin as he was on this prior  to his stroke.  Patient is already on cholesterol lowering medication and  niacin was added for his elevated triglycerides.  We will need an magnetic  resonance angiography of the brain, 2 Decho and a homocystine level which I  will order today.  We will follow the patient while he is in the  hospital  and the stroke consult service.      Shaune Pascal. Estella Husk, M.D.  Electronically Signed     DRC/MEDQ  D:  02/08/2006  T:  02/09/2006  Job:  [id number]

## 2010-11-21 NOTE — Consult Note (Signed)
NAMEVASILIY, CREAR NO.:  [id number]   MEDICAL RECORD NO.:  [id number]          PATIENT TYPE:  INP   LOCATION:  2008                         FACILITY:  [facility]   PHYSICIAN:  Jessy Oto. Fields, MD  DATE OF BIRTH:  May 20, 1941   DATE OF CONSULTATION:  DATE OF DISCHARGE:  02/06/2006                                   CONSULTATION   REQUESTING SERVICE:  Jerelene Redden, M.D.   REASON FOR CONSULTATION:  Possible symptomatic left carotid stenosis.   HISTORY OF PRESENT ILLNESS:  The patient is a 70 year old male who was  admitted approximately 48 hours ago for workup of chest pain.  During  obtaining history of his chest pain story, it was also listed that he had  recently had an episode of difficulty speaking.  He described this as being  able to think of words but not being able to say them and had a history  which sounded like a Broca's type aphasia.  He did not describe any slurring  of speech.  He did not describe any weakness or numbness of the arms or  legs.  He had not droop of his face.  He had no symptoms of amaurosis.  He  has had no previous episodes of stroke or TIA.  He has also had some left  sided headaches in the last 24-48 hours.   His atherosclerotic risk factors include coronary artery disease,  hyperlipidemia, tobacco abuse but quit in 1978, hypertension, borderline  diabetes.   PAST SURGICAL HISTORY:  Remarkable for coronary artery bypass grafting in  January 2001.   FAMILY HISTORY:  Remarkable for coronary artery disease.   MEDICATIONS:  1. Aspirin 325 mg once a day.  2. Metoprolol 100 mg once a day.  3. Niacin 750 mg p.o. b.i.d.  4. Pravastatin 20 mg twice a day.   ALLERGIES:  He has no known drug allergies   REVIEW OF SYSTEMS:  He denies any chest pain over the last 24 hours.  He  does not have dyspnea with exertion.  He denies any history of renal  insufficiency.  He denies any history of GI hemorrhage.  He denies any  history of  atrial fibrillation.  He denies any history of syncope.  He  denies any history of seizures.   PHYSICAL EXAMINATION:  VITAL SIGNS:  Temperature is 97, heart rate is 66,  blood pressure is 136/86.  HEENT:  Unremarkable.  NECK:  The neck shows high pitched carotid bruits bilaterally.  CHEST:  Clear to auscultation.  CARDIAC:  Regular rate and rhythm without murmur.  ABDOMEN:  Obese, soft, nontender, nondistended, no pulsatile mass.  He has  1+ femoral pulses bilaterally.  NEUROLOGICAL:  Exam shows no pronator drift.  He has no weakness of the  upper or lower extremities and has 5/5 motor strength bilaterally.  He has  no sensation decrease to light touch in the arms or legs.  There is no  asymmetry of his face.   LABORATORY DATA:  Carotid Duplex exam is remarkable for greater than 80%  left internal carotid artery  stenosis.  The patient had a head CT scan which  shows a possible mass in the left frontal region.  The area of abnormality  did not seem consistent with infarct.  There were no other abnormalities.   At the time of consultation, he is still in the process of workup for his  chest pain.  He is scheduled for a cardiac catheterization in 48 hours.  Additionally, he is scheduled for a head MRI to further define the  abnormality in the left frontal lobe.   I believe his symptoms certainly could be consistent with TIA.  However, he  states that he is still having trouble gathering his words at times now  which would put him at greater than 24 hours and symptomatically place him  in the category of stroke if these symptoms cannot be explained by the  lesion in the frontal lobe.  With the stenosis greater than 80% in the left  internal carotid artery, he certainly warrants carotid endarterectomy at  some point.  However, we need to further define what the lesion is in his  left frontal lobe and also further determine his cardiac status prior to  proceeding with an operative plan.   I will follow as a consult.   Thank you for allowing me to participate in his care.      Jessy Oto. Fields, MD  Electronically Signed     CEF/MEDQ  D:  02/06/2006  T:  02/06/2006  Job:  [id number]

## 2010-11-21 NOTE — Consult Note (Signed)
[place]  Patient:    Brad Day                         MRN: [id number] Proc. Date: 07/10/99 Adm. Date:  [id number] Attending:  Cristopher Peru CC:         Minus Breeding, M.D. [facility]             Champ Mungo. Lovena Le, M.D. [facility]             Gaye Pollack, M.D.                          Consultation Report  CLINICAL HISTORY:  This patient is a 70 year old white male who has never been n the hospital before and has no history of coronary disease who reports several weeks of exertional shortness of breath.  He then had some short episodes of left-sided chest pain beginning about five days ago with exertion.  He continued to be very active but was feeling poorly.  Then on July 08, 1999, he developed pain at rest.  It was initially 8/10 which then improved to 1/10 after 1 sublingual nitroglycerin.  This was associated with left arm numbness and some shortness of breath.  He also had diaphoresis.  He ruled out for myocardial infarction. Cardiac catheterization today showed severe two-vessel coronary disease.  The LAD has a  proximal aneurysm ending with a 75% stenosis with a ______ lesion at the first septal and diagonal branch.  The first diagonal branch has 90% proximal stenosis. The left circumflex has a bifurcating ramus branch that has a 70% ostial stenosis in the first branch and a 70% mid vessel stenosis in the second branch.  The major obtuse marginal branch has a long 70 to 80% stenosis.  The right coronary artery has minimal irregularity in the mid portion with perhaps a 25% narrowing.  Left  ventricular function is normal.  REVIEW OF SYSTEMS:  1) Constitutional: He denies fever or chills.  He had no change in his appetite.  He has had no night sweats.  He denies weight loss.  2) ENT: e has had no visual changes.  He has had no problems with his nose or throat. 3) Cardiovascular: As above.  He has had no orthopnea and no PND.  He has  had no palpitations.  4) Respiratory: He denies cough or sputum production.  He has had no upper respiratory symptoms.  5) GI: He has normal bowel function.  He denies melena or bright red blood per rectum.  He has had no nausea or vomiting.  6) GU: Negative.  7) Skin: Negative.  8) Musculoskeletal: Negative.  9) Neurologic: He has had no focal weakness or numbness.   He has had no dizziness or syncope. 10) Psychiatric: Negative.  11) Hematologic: He has had no history of bleeding disorders or easy bleeding.  12) Endocrine: Negative.  13) Allergies: Negative.  PAST MEDICAL HISTORY:  Negative.  He has never had a cholesterol profile checked. He has had no surgery.  FAMILY HISTORY:  Significant for coronary disease.  He has a sister who has had  coronary bypass surgery.  SOCIAL HISTORY:  Significant in that he is married for 24 years and currently works for a [employer] where he has been for many years.  He is a remote smoker but quit in 1978 after a 40-pack-year history.  He denies ethanol  use.  MEDICATIONS:  He was on none at the time of admission.  PHYSICAL EXAMINATION:  VITAL SIGNS:  Blood pressure 120/80, pulse 70 and regular, respiratory rate 18 nd unlabored.  GENERAL:  He is a robust white male in no distress.  HEENT:  Normocephalic, atraumatic.  Pupils are equal and reactive to light and accommodation.  The extraocular muscles are intact.  His throat is clear.  NECK:  Normal carotid pulses bilaterally.  There are no bruits.  There is no adenopathy or thyromegaly.  CARDIAC:  Regular rate and rhythm with a normal S1 and S2.  There is no murmur,  rub, or gallop.  LUNGS:  Clear.  ABDOMEN:  Active bowel sounds.   His abdomen was soft, mildly obese, and nontender.  There are no masses and no hepatosplenomegaly.  EXTREMITIES:  No peripheral edema.  Dorsalis pedis and posterior tibial pulses re palpable bilaterally.  SKIN:  Warm and dry without  lesions.  NEUROLOGIC:  He is alert and oriented x 3.  Motor and sensory exams are normal.  LABORATORY DATA:  Carotid Doppler examination shows no internal carotid artery stenosis.  Hematocrit 39.1, platelet count 171,000.  CPK at time of admission was 86 with n MB fraction of 3.4, and his troponin was 0.10.  His glucose was 108, and his creatinine was 1.1.  Liver function profile was normal.  Electrocardiogram shows normal sinus rhythm with left axis deviation and incomplete right bundle branch block with nonspecific T wave abnormality.  Chest x-ray shows no active disease.  IMPRESSION:  This 70 year old gentleman has severe two-vessel coronary disease ith complete left anterior descending artery and diagonal stenosis.  He has recent onset of unstable anginal symptoms and is at high risk for development of further ischemia and infarction.  I agree that coronary artery bypass graft surgery is he best treatment for this patient.  I have discussed the operative procedure of coronary bypass surgery with him and his wife including alternatives, benefits, and risks including bleeding, possible transfusion, infection, stroke, myocardial infarction, and death.  They understand and would like to proceed with surgery. We will schedule this for tomorrow, July 10, 1998. DD:  07/10/99 TD:  07/10/99 Job: [id number]

## 2010-11-21 NOTE — Discharge Summary (Signed)
   NAME:  Brad Day, Brad Day NO.:  [id number]   MEDICAL RECORD NO.:  [id number]                   PATIENT TYPE:  INP   LOCATION:  2015                                 FACILITY:  [facility]   PHYSICIAN:  Jenkins Rouge, M.D. [facility]              DATE OF BIRTH:  Mar 19, 1941   DATE OF ADMISSION:  09/11/2002  DATE OF DISCHARGE:  09/12/2002                                 DISCHARGE SUMMARY   DISCHARGE DIAGNOSES:  1. Chest pain, myocardial infarction ruled out by enzymes x2.  2. History of coronary artery disease, status post coronary artery bypass     graft in 2001.  3. Hyperlipidemia.  4. Treated hypertension.   HOSPITAL COURSE:  Please refer to the admission History and Physical by Dr.  Johnsie Cancel for complete details.  The patient was admitted with atypical chest  pain.  CK-MBs were negative x2.  Troponin I was negative x1.  The patient  remained pain free and there were no arrhythmias noted on telemetry.  The  original plan for the patient was to perform an outpatient nuclear stress  test on September 12, 2002.  However, due to scheduling, this was impossible.  The patient is set up for a stress Cardiolite in our office on September 13, 2002, at 8:45 a.m.  The patient was in stable condition on the afternoon of  March 9, and it was felt he was ready for discharge to home.   DISCHARGE MEDICATIONS:  1. Toprol XL 100 mg a day.  2. Lescol 80 mg a day.  3. Aspirin 81 mg a day.   ACTIVITY:  As tolerated.   DIET:  Low fat, low sodium diet.   FOLLOW UP:  The patient has been advised about his stress test in our office  tomorrow, Wednesday, September 13, 2002, at 8:45 a.m.  He has been advised not  to eat anything after midnight and he should not take his Toprol tomorrow  morning before his stress test.      Richardson Dopp, P.A.                        Jenkins Rouge, M.D. [facility]    SW/MEDQ  D:  09/12/2002  T:  09/13/2002  Job:  [id number]   cc:   Herbie Baltimore L. Maceo Pro, M.D.  [address]  [place]  [place] [address]  Fax: [phone]

## 2010-11-21 NOTE — Discharge Summary (Signed)
NAMEROSTON, REIMERS NO.:  [id number]   MEDICAL RECORD NO.:  [id number]          PATIENT TYPE:  INP   LOCATION:  2008                         FACILITY:  [facility]   PHYSICIAN:  Helen Hashimoto, MD    DATE OF BIRTH:  March 06, 1941   DATE OF ADMISSION:  DATE OF DISCHARGE:  02/10/2006                                 DISCHARGE SUMMARY   DISCHARGE DIAGNOSES:  1. Acute cerebrovascular accident.  2. Atypical chest pain.  3. History of coronary artery disease status post coronary artery bypass      grafting in 2001.  4. Hyperlipidemia.   DISCHARGE MEDICATIONS:  1. Plavix 75 mg p.o. daily once daily.  2. Toprol XL 100 mg p.o. daily once daily.  3. Advicor 750/20 mg p.o. daily twice daily.   FOLLOW- UP APPOINTMENT:  1. Dr. Oneida Alar on March 12, 2006.  2. Dr. Leonie Man in 2-3 months.   CONSULTS DONE DURING HOSPITALIZATION:  1. Cardiology consult with Dr. Cristopher Peru.  2. Vascular surgery consult with Dr. Oneida Alar.  3. Neurology consult with Clearence Cheek, M.D.   PROCEDURES:  Cardiac catheterization.   HOSPITAL COURSE:  1. Acute CVA:  This patient presented with aphasia.  CT scan of the head      was done, and it came back negative.  MRI of the brain showed acute      infarction with the largest area involving the left frontal cortex with      other acute left occipital infract.  MRA of the brain was normal.      Carotid Doppler showed left carotid stenosis of around 80%.  Vascular      surgery consultation was done, and they agreed to do an arterectomy,      and the patient will be scheduled as an outpatient.  It will take      around 4 weeks since he has been in the acute phase of stroke.  Patient      was switched from aspirin to Plavix because he was on aspirin when he      developed the stroke, and that was not helpful, so Plavix is started      and will be discharged on Plavix.  2. Coronary artery disease:  When the patient presented he had chest pain.  Cardiology consultation was done, and cardiac catheterization was done      that showed severe 3-vessel disease with the distal right coronary      artery occlusion that seems to be new, and all the grafts are patent.      Cardiology decided to go with just medical treatment, and if the      patient developed more symptoms then a stent angioplasty will be      considered for the right coronary artery.  Patient is already taking      Toprol XL.  We will add lisinopril to his medication, and he is      already on a statin.  I will also add sublingual nitroglycerin as      needed.  3. Hypercholesterolemia:  Continue  his medications.   Total assessment time is 40 minutes.      Helen Hashimoto, MD  Electronically Signed     NAE/MEDQ  D:  02/10/2006  T:  02/10/2006  Job:  [id number]   cc:   Scarlett Presto, M.D.  Champ Mungo. Lovena Le, M.D.

## 2010-11-21 NOTE — Cardiovascular Report (Signed)
NAMEWHITFIELD, FOBBS NO.:  [id number]   MEDICAL RECORD NO.:  [id number]          PATIENT TYPE:  INP   LOCATION:  2008                         FACILITY:  [facility]   PHYSICIAN:  Glori Bickers, M.D. LHCDATE OF BIRTH:  October 30, 1940   DATE OF PROCEDURE:  02/08/2006  DATE OF DISCHARGE:                              CARDIAC CATHETERIZATION   PRIMARY CARDIOLOGIST:  Dr. Lovena Le   PRIMARY CARE PHYSICIAN:  Dr. Briscoe Deutscher   IDENTIFICATION:  Mr. Borromeo is a very pleasant 70 year old male with a  history of coronary artery disease status post bypass surgery in 2001.  He  also has a history of hypertension and hyperlipidemia.  He was admitted with  atypical chest pain.  He is ruled out for myocardial infarction with serial  cardiac markers and his EKG is nonacute.  However, since his chest pain was  very similar to his previous angina he is referred for diagnostic  angiography.   PROCEDURES PERFORMED:  1.  Selective coronary angiography.  2.  Saphenous vein graft angiography x2.  3.  LIMA angiography.  4.  Left heart catheterization.  5.  Left ventriculogram.   DESCRIPTION OF THE PROCEDURE:  Risks and benefits of the procedure were  explained.  Consent was signed and placed on the chart.  A 6-French arterial  sheath was placed in the right femoral artery.  Standard catheters including  JL-4, JR-4 and angled pigtail were used for the procedure.  We also used an  IMA exchanged over a long exchange wire to intubate the left internal  mammary artery.  All catheter exchanges were made over a wire.  There are no  apparent complications.   Central aortic pressure was 141/87 with a mean of 110.  LV pressure was  151/9 with an EDP of 17.  There was no aortic stenosis.   Left main was normal.   LAD was mildly aneurysmal in the ostial portion.  This was followed by a 70%  lesion and then an 80% lesion.  The distal LAD was then filled through the  IMA graft.   The left  circumflex was a large vessel.  It gave off a bifurcating ramus and  several marginal branches.  There was a 30% lesion in the mid left  circumflex.  Both arms of the ramus and the several marginal branches were  totally occluded either ostially or in their proximal portion.   Right coronary artery was a moderate-sized dominant vessel.  It had a 40%  proximal lesion, diffuse 40% stenosis throughout the midsection, and the  distal RCA was totally occluded around the distal bend.  There were bridging  collaterals filling the distal RCA and PDA well.  Of note, this appeared new  since 2001.   The saphenous vein graft to the diagonal and possible ramus or high OM was  widely patent.   The saphenous vein graft to the lower ramus branch and the OM was also  patent.  There appeared to be 40% stenosis in the distal portion of the  graft near the insertion to the OM branch.  The LIMA to the LAD was widely patent with moderate diffuse disease in the  distal LAD proper.   Left ventriculogram done in the RAO projection showed an EF of 60% with no  obvious wall motion abnormalities and no mitral regurgitation.   ASSESSMENT:  1.  Severe native three-vessel disease.  The distal right coronary artery      occlusion appears new since 2001 but there are bridging collaterals with      good flow to the distal right coronary artery and the overall vessel is      fairly small.  2.  All grafts patent.  3.  Normal left ventricular function.   PLAN:  We will proceed with medical therapy.  Should he develop high-grade  symptoms, one could consider possible angioplasty of his chronically-  occluded right coronary artery but the distribution is fairly small and it  does not seem to be causing him symptoms.   He will need aggressive management of his risk factors to prevent  progressive disease.      Glori Bickers, M.D. [facility]  Electronically Signed     DB/MEDQ  D:  02/08/2006  T:  02/08/2006   Job:  [id number]   cc:   Herbie Baltimore L. Maceo Pro, M.D.

## 2010-11-21 NOTE — Op Note (Signed)
NAMEZAKARIYE, PERSAD NO.:  [id number]   MEDICAL RECORD NO.:  [id number]          PATIENT TYPE:  INP   LOCATION:  2006                         FACILITY:  [facility]   PHYSICIAN:  Jessy Oto. Fields, MD  DATE OF BIRTH:  07/01/1941   DATE OF PROCEDURE:  03/18/2006  DATE OF DISCHARGE:                                 OPERATIVE REPORT   PROCEDURE:  Left carotid endarterectomy.   PREOPERATIVE DIAGNOSIS:  Symptomatic left internal carotid artery stenosis.   POSTOPERATIVE DIAGNOSIS:  Symptomatic left internal carotid artery stenosis.   ANESTHESIA:  General.   ASSISTANT:  Jadene Pierini, PA-C   INDICATIONS:  The patient is a 70 year old male with history of a left brain  stroke.  He has a high-grade stenosis of the left internal carotid artery.   OPERATIVE FINDINGS:  1. Greater than 90% stenosis of the left internal carotid artery.  2. A 10-French shunt.  3. Dacron patch.   OPERATIVE DETAILS:  After obtaining informed consent, the patient was taken  the operating room.  The patient was placed in supine position on the  operating room table.  After induction of general anesthesia and  endotracheal intubation, the patient's entire left neck and chest were  prepped and draped in the usual sterile fashion.  A Foley catheter was  placed.  Next an oblique incision was made on the left neck just anterior to  the sternocleidomastoid muscle.  Incision was deepened down through the  platysma and the sternocleidomastoid muscle was reflected laterally.  Dissection was then carried down onto the level of the left internal jugular  vein.  The common facial vein was dissected free circumferentially and  ligated between silk ties.  The common carotid artery was dissected free  circumferentially and elevated up into the operative field.  An umbilical  tape was placed around this.  Dissection then proceeded up to the carotid  bifurcation.  There was dense inflammatory reaction in this area,  which made  dissection fairly tedious due to a large amount of inflammatory peel around  the carotid bifurcation.  Additionally, the patient's anatomy had been  distorted such that the external carotid artery was coming off laterally and  the internal carotid artery coming off medially.  Several branches of the  external carotid artery were identified to confirm that this was indeed the  external carotid artery.  The superior thyroid artery was dissected free  circumferentially and controlled with a vessel loop.  The external carotid  artery was dissected free circumferentially and controlled with a vessel  loop.  The hypoglossal nerve was identified and protected from harm's way.  The internal carotid artery was dissected free circumferentially above the  level of the plaque.  Next, the patient was given 7000 units of intravenous  heparin.  It should be also noted that the patient was given an additional  2000 units of heparin during the case.  After systemic heparinization, the  patient's internal carotid artery was occluded with a vessel loop.  The  external and superior thyroid arteries were also occluded with vessel loops  and the common carotid artery controlled with a peripheral DeBakey clamp.  A  longitudinal arteriotomy was made in the common carotid artery just below  the bifurcation.  The arteriotomy was extended up through the carotid  bifurcation.  There was a high-grade stenosis greater than 90% of the  internal carotid artery at the bifurcation.  There was also recent  hemorrhage into a large plaque at the carotid bifurcation.  Next a 10-French  shunt was brought up on the operative field.  This was threaded into a  normal segment of the distal internal carotid artery.  This was allowed to  back bleed thoroughly.  This was then secured in place with a small shunt  clamp.  The catheter was then threaded down into the common carotid artery  and controlled with a Rumel  tourniquet.  Flow was then restored to the brain  after opening the shunt and the shunt was also inspected for air prior to  restoring flow.  Next, endarterectomy was begun in a suitable plane near the  carotid bifurcation.  The external carotid artery was essentially occluded.  After eversion endarterectomy, there was some backbleeding from the external  carotid artery.  A suitable endpoint was obtained in the internal carotid  artery, although this was fairly high and up underneath the hypoglossal  nerve.  Next, one 7-0 Prolene suture was used as a tacking stitch on the  posterior wall of the internal carotid artery.  A Dacron patch was then  brought up on the operative field and sewn on as a patch angioplasty using a  running 6-0 Prolene suture.  Just prior to completion of the anastomosis,  the shunt was clamped and the distal internal carotid artery controlled with  a fine bulldog clamp.  The shunt was then removed from the internal carotid  artery and then also from the proximal common carotid artery, which was  reoccluded with a peripheral DeBakey clamp.  The area was thoroughly  irrigated with heparinized saline solution.  The anastomosis was then  completed and the external carotid artery was back bled to fill the artery.  Next, the common carotid artery was unclamped and flow first restored to the  external carotid artery and, after approximately five cardiac cycles, up to  the internal carotid artery.  Hemostasis was obtained.  There was good  Doppler flow through the internal, external and common carotid arteries.  Next the platysma muscle was reapproximated using running 3-0 Vicryl suture.  Skin was closed with a 4-0 Vicryl subcuticular stitch.  The patient  tolerated the procedure well, and there were no complications.  There was a  fair amount of traction on the hypoglossal nerve during the course of  dissection of the artery as well as during the endarterectomy and repair  of the artery due to the high level of extent of the plaque.  The skin was  closed with a 4-0 Vicryl subcuticular stitch.  The patient tolerated the  procedure well and there were no complications.  The patient was awakened in  the operating room and had symmetric upper extremity and lower extremity  motor movement.  The patient was taken to the recovery room in stable  condition.  Instrument, sponge and needle count was correct at the end of  the case.      Jessy Oto. Fields, MD  Electronically Signed     CEF/MEDQ  D:  03/18/2006  T:  03/19/2006  Job:  [id number]

## 2010-11-21 NOTE — H&P (Signed)
NAME:  Brad Day, Brad Day NO.:  [id number]   MEDICAL RECORD NO.:  [id number]          PATIENT TYPE:  EMS   LOCATION:  MAJO                         FACILITY:  [facility]   PHYSICIAN:  Jerelene Redden, MD      DATE OF BIRTH:  09/08/1940   DATE OF ADMISSION:  02/04/2006  DATE OF DISCHARGE:                                HISTORY & PHYSICAL   Brad Day is a pleasant 70 year old man who states that yesterday morning  when he awakened, he noticed that he was having left-sided chest aching,  which seemed to wax and wane in intensity.  It did not seem to go down his  arm.  It was not obviously associated with shortness of breath.  There was  no diaphoresis or nausea.  The pain seemed to gradually go away and then  increase in severity during the day and was not apparently affected by  activity.  In spite of the pain, he went to work.  He says that his work  does involve some lifting.  He will typically carry around a 2-1/2 gallon  container of water and lifting and carrying this did not seem to have any  affect on the pain.  During the day, his family members noted that he seemed  to have some difficulty with word choice and also with short term memory.  They describe his speech as sounding slurred.  Today, after awakening, he  once again experienced the recurrence of the chest aching.  He contacted Dr.  Delman Kitten office and was advised to come to the hospital for evaluation.  On  arrival, an electrocardiogram was obtained which showed a right bundle with  left axis deviation.  His blood pressure was 147/88.  O2 saturation was 97%.  His electrolytes were within normal limits.  Glucose was 109.   Because of risk factors, as described below, the patient will be admitted  because of concern that he is at risk for recurrent coronary artery disease.   In 2001, Brad Day underwent a CABG, which apparently was uneventful.  He  was readmitted in 2004 for evaluation of chest pain and  underwent a stress  Cardiolite at [facility] on the next day.  This report is not  contained in E chart, but the patient states that it was normal.  Since that  time, he thinks that he has had about two additional stress tests done at  [facility], most recently in October, 2006, and he states that all  of these tests have been normal.  He is treated by Dr. Maceo Pro for elevated  cholesterol and is maintained chronically on aspirin and a beta blocker.  Also of note is a very strong family history of heart disease.  He has four  siblings who have had open heart surgery.  He also reports that he has a  brother who underwent a carotid endarterectomy.   CURRENT MEDICATIONS:  1.  Advicor 750 mg b.i.d.  2.  Toprol XL 100 mg daily.  3.  Aspirin 81 mg daily.   There are no known drug  allergies.   OPERATION:  The only operation Brad Day has had in the past is CABG in  2001, as noted above.   MEDICAL ILLNESSES:  Otherwise none.   FAMILY HISTORY:  As described above.   SOCIAL HISTORY:  The patient discontinued cigarette smoking in 1978.  He  does not drink alcohol.  He does not abuse drugs.  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   REVIEW OF SYSTEMS:  HEAD:  He denies headache or dizziness.  EYES:  He  denies visual blurring or diplopia.  EARS, NOSE, AND THROAT:  Denies sinus  pain, earache, or sore throat.  CHEST:  Denies coughing, wheezing, or chest  congestion.  CARDIOVASCULAR:  He denies orthopnea, PND, or ankle edema,  otherwise See above.  GI:  His wife reports that he has been having some  problems with belching.  He has not had any epigastric pain.  There has been  no hematemesis or melena.  GU:  He denies dysuria or urinary frequency.  NEURO:  There is no history of seizure or stroke.  Patient states that he  has not had any previous history of arm or leg weakness, arm or leg  numbness, or  speech or swallowing difficulty.   PHYSICAL EXAMINATION:  HEENT:  Within normal limits.  NECK:  Carotids are 2+.  There are no bruits.  There is no lymphadenopathy  or thyromegaly.  CHEST:  Clear.  BACK:  No CVA or point tenderness.  CARDIOVASCULAR:  Normal S1 and S2 without murmurs, rubs or gallops.  ABDOMEN:  Benign.  There are normal bowel sounds.  There is no masses or  tenderness.  No rebound or guarding.  NEUROLOGIC:  Cranial nerves, motor, sensory, and cerebellar testing is  normal.  EXTREMITIES:  No evidence of cyanosis or edema.   IMPRESSION:  1.  Atypical chest pain in a man with multiple risk factors for heart      disease.  This does require additional evaluation.  2.  Transient slurred speech of uncertain significance.  I think this needs      to be further evaluated as well.  3.  Coronary artery disease, status post coronary artery bypass graft in      2001.  4.  Hyperlipidemia.   PLAN:  I think the appropriate evaluation for this problem will consist of  obtaining serial cardiac enzymes.  Will continue beta blocker, aspirin,  Lovenox.  Will check carotid Doppler, and CT head.  Potentially MRI scan may  be indicated.  Will obtain cardiology consult from [facility], who  has followed him in the past.  Possibly another stress test will be  indicated.  Will continue his Advicor.           ______________________________  Jerelene Redden, MD     SY/MEDQ  D:  02/04/2006  T:  02/04/2006  Job:  [id number]   cc:   Herbie Baltimore L. Maceo Pro, M.D.  Champ Mungo. Lovena Le, M.D.

## 2010-11-21 NOTE — Discharge Summary (Signed)
. [facility]  Patient:    Brad Day, Brad Day                       MRN: [id number] Adm. Date:  07/09/99 Disc. Date: 07/16/99 Attending:  Gaye Pollack, M.D. Dictator:   Marcellus Scott, P.A. CC:         Gaye Pollack, M.D.             Briscoe Deutscher, M.D.             Champ Mungo. Lovena Le, M.D. [facility]                           Discharge Summary  DATE OF BIRTH:  Sep 09, 1940  PRIMARY CARE PHYSICIAN:  Dr. Briscoe Deutscher, [facility].  CARDIOLOGIST:  Champ Mungo. Lovena Le, M.D.  FINAL DIAGNOSES: 1. Unstable angina on admission. 2. Severe two-vessel atherosclerotic coronary artery disease. 3. Admission cardiac enzymes negative. 4. Impaired glucose tolerance in postoperative period. 5. Hyperlipidemia on admission laboratory.  SECONDARY DIAGNOSES: 1. Hypertension. 2. History of tobacco habituation.  PROCEDURES: 1. July 10, 1999, left heart catheterization.  Left ventriculogram and    coronary angiography, Dr. Percival Spanish.  In this study the left main was free    of disease.  The left anterior descending coronary artery had a proximal    aneurysmal segment with an ending in a 75% stenosis.  There was a complex    lesion in the left anterior descending coronary artery at the septal and    diagonal branches.  The first diagonal had a 90% proximal stenosis. The    left circumflex had a ramus with a 70% ostial stenosis, a 70% mid point    left circumflex stenosis.  The right coronary artery had a 25% mid point    stenosis. 2. July 11, 1999, coronary artery bypass graft surgery x 5, Dr. Gilford Raid, surgeon.  In this procedure the left internal mammary artery    was connected in an end-to-side fashion to the left anterior descending    coronary artery.  A sequential saphenous vein graft was fashioned from the    aorta to the first diagonal and then to the first intermediate.  A    sequential saphenous vein graft was fashioned from the aorta to  the obtuse    marginal and then to the second intermediate.  DISCHARGE DISPOSITION:  Brad Day is a candidate for discharge on postoperative day #5.  He was achieving oxygen saturations greater than 90% on oom air by postoperative day #3.  He has experienced no cardiac dysrhythmias postoperatively.  He has had no respiratory compromise.  His wounds are healing  nicely.  There is no evidence of erythema, drainage, or swelling.  His mental status has been clear on the postoperative period.  He is ambulating independently. He has a return of appetite with full GI tract function.  His pain is controlled well with oral analgesia.  DISCHARGE MEDICATIONS:  He goes home on the following medications: 1. Percocet 5/325 one to two tablets p.o. q.4-6h. p.r.n. pain. 2. Enteric-coated aspirin 325 mg daily. 3. Lipitor 20 mg at bedtime daily. 4. Toprol XL 50 mg daily.  DISCHARGE ACTIVITY:  Ambulation as tolerated.  He is asked not to lift any weight more than 10 pounds nor to drive for the next six weeks.  DISCHARGE DIET:  Low sodium,  low cholesterol diet.  WOUND CARE:  He may shower daily keeping his incisions clean and dry.  FOLLOWUP:  Office visit with the [facility], Dr. Earlene Plater office, Wednesday, July 23, 1999, at 10 oclock in the morning for staple removal.  He will also have an office visit with Dr. Cristopher Peru in two  weeks.  He is urged to call [facility] to arrange that appointment and e has an office visit with Dr. Cyndia Bent to be arranged July 23, 1999, at the staple removal.  He is also urged to call Dr. Garrel Ridgel office to arrange an appointment for follow-up of his impaired glucose tolerance.  BRIEF HISTORY:  Brad Day is a 70 year old male, who has never had a history of surgery, never had a history of coronary artery disease.  He reports exertional shortness of breath for the past several weeks.  Prior to  his admission at [facility] on July 08, 1999, he reports five days of left-sided chest pain which was transient.  He continued to be active but was feeling poorly. On July 08, 1999, he developed pain at rest.  The pain was 8/10 which improved after one sublingual nitroglycerin.  There was also associated left arm paresthesias nd some dyspnea.  He also had diaphoresis.  On admission to [facility], cardiac enzymes were negative.  He was admitted for further work-up including left heart catheterization.  HOSPITAL COURSE:  After admission to [facility] through the [facility] Room for unstable angina, he was ruled out for  myocardial infarction.  He was stable on IV heparin, IV nitroglycerin.  He was given aspirin and Lopressor.  He remained stable on January 3 and on January 4 e underwent left heart catheterization with the results dictated above.  He also underwent carotid ultrasound which showed no significant internal carotid artery stenosis.  He had palpable pulses in the lower extremities.  He was also seen by Dr. Gilford Raid of the [facility] who described the need for revascularization surgery to [name].  [name] understood the risks and benefits of such a procedure and agreed to proceed with the surgery.  The surgery was done January 5, five bypasses were placed as previously described without complication.  He was transferred to the intensive  care unit on nitroglycerin drip.  He was weaned off the ventilator support overnight.  On postoperative day #1 his cardiac index was 2.5.  He was 4 pounds  fluid positive.  His hematocrit was 32%, creatinine 0.9 and he was started on a  gentle diuresis.  On postoperative day #2, hemoglobin was 10.5, creatinine 1.2. He was achieving 98% oxygen saturations on 2 L of nasal cannula.  All  chest tubes ere  discontinued.  By postoperative day #3 he was relieved of supplemental oxygen achieving 92% oxygen saturation on room air.  His incisions were healing nicely. His lung were clear.  By postoperative day #4, his pulmonary function had improved such that he was achieving 95% oxygen saturations on room air.  He was still 2 pounds fluid positive but he was ambulating independently.  His mental status was clear in the total postoperative period.  His incisions were healing nicely. He did have evidence of serum glucose at no time over 200 mg/dl, however, and he was urged to follow up with Dr. Maceo Pro for this impaired glucose tolerance.  As his appetite improves  postoperatively, it might be surmised that his serum glucose might suffer further derangement. DD:  07/15/99 TD:  07/16/99 Job: [id number]

## 2011-04-02 ENCOUNTER — Other Ambulatory Visit (INDEPENDENT_AMBULATORY_CARE_PROVIDER_SITE_OTHER): Payer: Medicare Other | Admitting: *Deleted

## 2011-04-02 DIAGNOSIS — I6529 Occlusion and stenosis of unspecified carotid artery: Secondary | ICD-10-CM

## 2011-04-02 DIAGNOSIS — Z48812 Encounter for surgical aftercare following surgery on the circulatory system: Secondary | ICD-10-CM

## 2011-04-07 ENCOUNTER — Encounter: Payer: Self-pay | Admitting: Vascular Surgery

## 2011-04-07 NOTE — Procedures (Unsigned)
CAROTID DUPLEX EXAM  INDICATION:  Carotid artery disease.  HISTORY: Diabetes:  No. Cardiac:  Open heart surgery in 2001. Hypertension:  Yes. Smoking:  No. Previous Surgery:  Left carotid endarterectomy, 03/18/2006. CV History:  Currently asymptomatic. Amaurosis Fugax No, Paresthesias No, Hemiparesis No.                                      RIGHT             LEFT Brachial systolic pressure:         136               137 Brachial Doppler waveforms:         Normal            Normal Vertebral direction of flow:        Antegrade         Antegrade DUPLEX VELOCITIES (cm/sec) CCA peak systolic                   84                74 ECA peak systolic                   116               [id number] ICA peak systolic                   87                50 ICA end diastolic                   36                26 PLAQUE MORPHOLOGY:                  Smooth PLAQUE AMOUNT:                      Minimal           None PLAQUE LOCATION:                    Bifurcation  IMPRESSION: 1. No hemodynamically significant stenosis of the right internal     carotid artery. 2. Patent left carotid endarterectomy site with no evidence of     restenosis of the internal carotid artery. 3. Antegrade vertebral arteries bilaterally.  ___________________________________________ Jessy Oto Fields, MD  EM/MEDQ  D:  04/02/2011  T:  04/02/2011  Job:  [id number]

## 2011-04-10 ENCOUNTER — Other Ambulatory Visit: Payer: Self-pay | Admitting: Vascular Surgery

## 2011-04-10 DIAGNOSIS — I6529 Occlusion and stenosis of unspecified carotid artery: Secondary | ICD-10-CM

## 2011-04-10 DIAGNOSIS — Z48812 Encounter for surgical aftercare following surgery on the circulatory system: Secondary | ICD-10-CM

## 2011-12-07 DIAGNOSIS — Z8719 Personal history of other diseases of the digestive system: Secondary | ICD-10-CM | POA: Diagnosis not present

## 2011-12-07 DIAGNOSIS — E119 Type 2 diabetes mellitus without complications: Secondary | ICD-10-CM | POA: Diagnosis not present

## 2011-12-07 DIAGNOSIS — I10 Essential (primary) hypertension: Secondary | ICD-10-CM | POA: Diagnosis not present

## 2011-12-07 DIAGNOSIS — E782 Mixed hyperlipidemia: Secondary | ICD-10-CM | POA: Diagnosis not present

## 2012-01-09 DIAGNOSIS — J189 Pneumonia, unspecified organism: Secondary | ICD-10-CM | POA: Diagnosis not present

## 2012-01-09 DIAGNOSIS — N390 Urinary tract infection, site not specified: Secondary | ICD-10-CM | POA: Diagnosis not present

## 2012-01-09 DIAGNOSIS — N200 Calculus of kidney: Secondary | ICD-10-CM | POA: Diagnosis not present

## 2012-01-09 DIAGNOSIS — E119 Type 2 diabetes mellitus without complications: Secondary | ICD-10-CM | POA: Diagnosis not present

## 2012-01-09 DIAGNOSIS — R109 Unspecified abdominal pain: Secondary | ICD-10-CM | POA: Diagnosis not present

## 2012-01-09 DIAGNOSIS — Z79899 Other long term (current) drug therapy: Secondary | ICD-10-CM | POA: Diagnosis not present

## 2012-01-09 DIAGNOSIS — R509 Fever, unspecified: Secondary | ICD-10-CM | POA: Diagnosis not present

## 2012-01-11 DIAGNOSIS — N390 Urinary tract infection, site not specified: Secondary | ICD-10-CM | POA: Diagnosis not present

## 2012-01-11 DIAGNOSIS — I714 Abdominal aortic aneurysm, without rupture, unspecified: Secondary | ICD-10-CM | POA: Diagnosis not present

## 2012-01-11 DIAGNOSIS — N200 Calculus of kidney: Secondary | ICD-10-CM | POA: Diagnosis not present

## 2012-01-18 DIAGNOSIS — IMO0001 Reserved for inherently not codable concepts without codable children: Secondary | ICD-10-CM | POA: Diagnosis not present

## 2012-03-07 DIAGNOSIS — T148XXA Other injury of unspecified body region, initial encounter: Secondary | ICD-10-CM | POA: Diagnosis not present

## 2012-03-07 DIAGNOSIS — M259 Joint disorder, unspecified: Secondary | ICD-10-CM | POA: Diagnosis not present

## 2012-03-07 DIAGNOSIS — K219 Gastro-esophageal reflux disease without esophagitis: Secondary | ICD-10-CM | POA: Diagnosis not present

## 2012-03-07 DIAGNOSIS — M7989 Other specified soft tissue disorders: Secondary | ICD-10-CM | POA: Diagnosis not present

## 2012-03-07 DIAGNOSIS — S99919A Unspecified injury of unspecified ankle, initial encounter: Secondary | ICD-10-CM | POA: Diagnosis not present

## 2012-03-07 DIAGNOSIS — Z79899 Other long term (current) drug therapy: Secondary | ICD-10-CM | POA: Diagnosis not present

## 2012-03-07 DIAGNOSIS — Z8673 Personal history of transient ischemic attack (TIA), and cerebral infarction without residual deficits: Secondary | ICD-10-CM | POA: Diagnosis not present

## 2012-03-07 DIAGNOSIS — E119 Type 2 diabetes mellitus without complications: Secondary | ICD-10-CM | POA: Diagnosis not present

## 2012-03-07 DIAGNOSIS — S8990XA Unspecified injury of unspecified lower leg, initial encounter: Secondary | ICD-10-CM | POA: Diagnosis not present

## 2012-03-07 DIAGNOSIS — S9030XA Contusion of unspecified foot, initial encounter: Secondary | ICD-10-CM | POA: Diagnosis not present

## 2012-03-10 DIAGNOSIS — M25579 Pain in unspecified ankle and joints of unspecified foot: Secondary | ICD-10-CM | POA: Diagnosis not present

## 2012-04-08 ENCOUNTER — Ambulatory Visit: Payer: No Typology Code available for payment source | Admitting: Neurosurgery

## 2012-04-08 ENCOUNTER — Other Ambulatory Visit: Payer: No Typology Code available for payment source

## 2012-04-12 ENCOUNTER — Encounter: Payer: Self-pay | Admitting: Vascular Surgery

## 2012-04-14 ENCOUNTER — Encounter: Payer: Self-pay | Admitting: Neurosurgery

## 2012-04-15 ENCOUNTER — Ambulatory Visit (INDEPENDENT_AMBULATORY_CARE_PROVIDER_SITE_OTHER): Payer: Medicare Other | Admitting: Neurosurgery

## 2012-04-15 ENCOUNTER — Encounter: Payer: Self-pay | Admitting: Neurosurgery

## 2012-04-15 ENCOUNTER — Other Ambulatory Visit (INDEPENDENT_AMBULATORY_CARE_PROVIDER_SITE_OTHER): Payer: Medicare Other | Admitting: *Deleted

## 2012-04-15 VITALS — BP 134/76 | HR 59 | Resp 16 | Ht 70.0 in | Wt 260.0 lb

## 2012-04-15 DIAGNOSIS — Z48812 Encounter for surgical aftercare following surgery on the circulatory system: Secondary | ICD-10-CM | POA: Diagnosis not present

## 2012-04-15 DIAGNOSIS — I6529 Occlusion and stenosis of unspecified carotid artery: Secondary | ICD-10-CM | POA: Diagnosis not present

## 2012-04-15 NOTE — Addendum Note (Signed)
Addended by: Mena Goes on: 04/15/2012 01:57 PM   Modules accepted: Orders

## 2012-04-15 NOTE — Progress Notes (Signed)
[facility] Carot[facility] Office Note  CC: Carotid surveillance Referring Physician: Fields  History of Present Illness: 71 year old male patient of Dr. Oneida Alar status post left CEA in 2007. The patient denies any signs or symptoms of CVA, TIA, amaurosis fugax or any neural deficit. The patient denies any new medical diagnoses or recent surgery.  Past Medical History  Diagnosis Date  . Arthritis   . Carotid artery occlusion   . Stroke     ROS: [x]  Positive   [ ]  Denies    General: [ ]  Weight loss, [ ]  Fever, [ ]  chills Neurologic: [ ]  Dizziness, [ ]  Blackouts, [ ]  Seizure [ ]  Stroke, [ ]  "Mini stroke", [ ]  Slurred speech, [ ]  Temporary blindness; [ ]  weakness in arms or legs, [ ]  Hoarseness Cardiac: [ ]  Chest pain/pressure, [ ]  Shortness of breath at rest [ ]  Shortness of breath with exertion, [ ]  Atrial fibrillation or irregular heartbeat Vascular: [ ]  Pain in legs with walking, [ ]  Pain in legs at rest, [ ]  Pain in legs at night,  [ ]  Non-healing ulcer, [ ]  Blood clot in vein/DVT,   Pulmonary: [ ]  Home oxygen, [ ]  Productive cough, [ ]  Coughing up blood, [ ]  Asthma,  [ ]  Wheezing Musculoskeletal:  [ ]  Arthritis, [ ]  Low back pain, [ ]  Joint pain Hematologic: [ ]  Easy Bruising, [ ]  Anemia; [ ]  Hepatitis Gastrointestinal: [ ]  Blood in stool, [ ]  Gastroesophageal Reflux/heartburn, [ ]  Trouble swallowing Urinary: [ ]  chronic Kidney disease, [ ]  on HD - [ ]  MWF or [ ]  TTHS, [ ]  Burning with urination, [ ]  Difficulty urinating Skin: [ ]  Rashes, [ ]  Wounds Psychological: [ ]  Anxiety, [ ]  Depression   Social History History  Substance Use Topics  . Smoking status: Former Smoker    Quit date: 07/06/1976  . Smokeless tobacco: Not on file  . Alcohol Use: Not on file    Family History Family History  Problem Relation Age of Onset  . Stroke Mother   . Heart disease Father     No Known Allergies  Current Outpatient Prescriptions  Medication Sig Dispense  Refill  . fish oil-omega-3 fatty acids 1000 MG capsule Take 1 g by mouth daily.      Marland Kitchen lisinopril (PRINIVIL,ZESTRIL) 10 MG tablet Take 10 mg by mouth daily.      . metFORMIN (GLUCOPHAGE) 500 MG tablet Take 500 mg by mouth 2 (two) times daily with a meal.      . metoprolol (LOPRESSOR) 50 MG tablet Take 50 mg by mouth 2 (two) times daily.      . niacin 500 MG tablet Take 500 mg by mouth daily with breakfast.      . pantoprazole (PROTONIX) 40 MG tablet Take 40 mg by mouth daily.      . simvastatin (ZOCOR) 40 MG tablet Take 40 mg by mouth every evening.        Physical Examination  Filed Vitals:   04/15/12 1336  BP: 134/76  Pulse: 59  Resp:     Body mass index is 37.31 kg/(m^2).  General:  WDWN in NAD Gait: Normal HEENT: WNL Eyes: Pupils equal Pulmonary: normal non-labored breathing , without Rales, rhonchi,  wheezing Cardiac: RRR, without  Murmurs, rubs or gallops; Abdomen: soft, NT, no masses Skin: no rashes, ulcers noted  Vascular Exam Pulses: 3+ radial pulses bilaterally Carotid bruits: Carotid pulses to auscultation no bruits are heard Extremities without  ischemic changes, no Gangrene , no cellulitis; no open wounds;  Musculoskeletal: no muscle wasting or atrophy   Neurologic: A&O X 3; Appropriate Affect ; SENSATION: normal; MOTOR FUNCTION:  moving all extremities equally. Speech is fluent/normal  Non-Invasive Vascular Imaging CAROTID DUPLEX 04/15/2012  Right ICA 20 - 39 % stenosis Left ICA 0 - 19% stenosis   ASSESSMENT/PLAN: Asymptomatic patient 6 years status post left CEA. The patient will followup in one year with repeat carotid duplex. The patient's questions were encouraged and answered, he is in agreement with this plan.  Beatris Ship ANP   Clinic MD: Bridgett Larsson

## 2012-06-07 DIAGNOSIS — E782 Mixed hyperlipidemia: Secondary | ICD-10-CM | POA: Diagnosis not present

## 2012-06-07 DIAGNOSIS — E119 Type 2 diabetes mellitus without complications: Secondary | ICD-10-CM | POA: Diagnosis not present

## 2012-06-07 DIAGNOSIS — N183 Chronic kidney disease, stage 3 unspecified: Secondary | ICD-10-CM | POA: Diagnosis not present

## 2012-06-07 DIAGNOSIS — E669 Obesity, unspecified: Secondary | ICD-10-CM | POA: Diagnosis not present

## 2012-06-07 DIAGNOSIS — I10 Essential (primary) hypertension: Secondary | ICD-10-CM | POA: Diagnosis not present

## 2012-06-07 DIAGNOSIS — Z8719 Personal history of other diseases of the digestive system: Secondary | ICD-10-CM | POA: Diagnosis not present

## 2012-12-05 DIAGNOSIS — E782 Mixed hyperlipidemia: Secondary | ICD-10-CM | POA: Diagnosis not present

## 2012-12-05 DIAGNOSIS — I10 Essential (primary) hypertension: Secondary | ICD-10-CM | POA: Diagnosis not present

## 2012-12-05 DIAGNOSIS — E119 Type 2 diabetes mellitus without complications: Secondary | ICD-10-CM | POA: Diagnosis not present

## 2012-12-05 DIAGNOSIS — I2510 Atherosclerotic heart disease of native coronary artery without angina pectoris: Secondary | ICD-10-CM | POA: Diagnosis not present

## 2012-12-05 DIAGNOSIS — N183 Chronic kidney disease, stage 3 unspecified: Secondary | ICD-10-CM | POA: Diagnosis not present

## 2013-02-10 DIAGNOSIS — H35369 Drusen (degenerative) of macula, unspecified eye: Secondary | ICD-10-CM | POA: Diagnosis not present

## 2013-03-06 HISTORY — PX: EYE SURGERY: SHX253

## 2013-03-07 DIAGNOSIS — H35369 Drusen (degenerative) of macula, unspecified eye: Secondary | ICD-10-CM | POA: Diagnosis not present

## 2013-03-07 DIAGNOSIS — H2510 Age-related nuclear cataract, unspecified eye: Secondary | ICD-10-CM | POA: Diagnosis not present

## 2013-03-07 DIAGNOSIS — H2589 Other age-related cataract: Secondary | ICD-10-CM | POA: Diagnosis not present

## 2013-03-07 DIAGNOSIS — H526 Other disorders of refraction: Secondary | ICD-10-CM | POA: Diagnosis not present

## 2013-03-07 DIAGNOSIS — H11009 Unspecified pterygium of unspecified eye: Secondary | ICD-10-CM | POA: Diagnosis not present

## 2013-03-07 DIAGNOSIS — H02839 Dermatochalasis of unspecified eye, unspecified eyelid: Secondary | ICD-10-CM | POA: Diagnosis not present

## 2013-03-15 DIAGNOSIS — H2589 Other age-related cataract: Secondary | ICD-10-CM | POA: Diagnosis not present

## 2013-03-15 DIAGNOSIS — H2510 Age-related nuclear cataract, unspecified eye: Secondary | ICD-10-CM | POA: Diagnosis not present

## 2013-03-15 DIAGNOSIS — Z01818 Encounter for other preprocedural examination: Secondary | ICD-10-CM | POA: Diagnosis not present

## 2013-03-23 DIAGNOSIS — Z87891 Personal history of nicotine dependence: Secondary | ICD-10-CM | POA: Diagnosis not present

## 2013-03-23 DIAGNOSIS — Z951 Presence of aortocoronary bypass graft: Secondary | ICD-10-CM | POA: Diagnosis not present

## 2013-03-23 DIAGNOSIS — I10 Essential (primary) hypertension: Secondary | ICD-10-CM | POA: Diagnosis not present

## 2013-03-23 DIAGNOSIS — H2510 Age-related nuclear cataract, unspecified eye: Secondary | ICD-10-CM | POA: Diagnosis not present

## 2013-03-23 DIAGNOSIS — Z8673 Personal history of transient ischemic attack (TIA), and cerebral infarction without residual deficits: Secondary | ICD-10-CM | POA: Diagnosis not present

## 2013-03-23 DIAGNOSIS — I2510 Atherosclerotic heart disease of native coronary artery without angina pectoris: Secondary | ICD-10-CM | POA: Diagnosis not present

## 2013-03-23 DIAGNOSIS — E785 Hyperlipidemia, unspecified: Secondary | ICD-10-CM | POA: Diagnosis not present

## 2013-03-23 DIAGNOSIS — E119 Type 2 diabetes mellitus without complications: Secondary | ICD-10-CM | POA: Diagnosis not present

## 2013-03-23 DIAGNOSIS — M129 Arthropathy, unspecified: Secondary | ICD-10-CM | POA: Diagnosis not present

## 2013-03-23 DIAGNOSIS — Z79899 Other long term (current) drug therapy: Secondary | ICD-10-CM | POA: Diagnosis not present

## 2013-03-23 DIAGNOSIS — H2589 Other age-related cataract: Secondary | ICD-10-CM | POA: Diagnosis not present

## 2013-03-23 DIAGNOSIS — K219 Gastro-esophageal reflux disease without esophagitis: Secondary | ICD-10-CM | POA: Diagnosis not present

## 2013-03-30 DIAGNOSIS — H2510 Age-related nuclear cataract, unspecified eye: Secondary | ICD-10-CM | POA: Diagnosis not present

## 2013-03-30 DIAGNOSIS — M129 Arthropathy, unspecified: Secondary | ICD-10-CM | POA: Diagnosis not present

## 2013-03-30 DIAGNOSIS — H2589 Other age-related cataract: Secondary | ICD-10-CM | POA: Diagnosis not present

## 2013-03-30 DIAGNOSIS — Z8673 Personal history of transient ischemic attack (TIA), and cerebral infarction without residual deficits: Secondary | ICD-10-CM | POA: Diagnosis not present

## 2013-03-30 DIAGNOSIS — E669 Obesity, unspecified: Secondary | ICD-10-CM | POA: Diagnosis not present

## 2013-03-30 DIAGNOSIS — E785 Hyperlipidemia, unspecified: Secondary | ICD-10-CM | POA: Diagnosis not present

## 2013-03-30 DIAGNOSIS — I10 Essential (primary) hypertension: Secondary | ICD-10-CM | POA: Diagnosis not present

## 2013-03-30 DIAGNOSIS — E119 Type 2 diabetes mellitus without complications: Secondary | ICD-10-CM | POA: Diagnosis not present

## 2013-03-30 DIAGNOSIS — K219 Gastro-esophageal reflux disease without esophagitis: Secondary | ICD-10-CM | POA: Diagnosis not present

## 2013-04-01 HISTORY — PX: EYE SURGERY: SHX253

## 2013-04-19 ENCOUNTER — Encounter: Payer: Self-pay | Admitting: Family

## 2013-04-20 ENCOUNTER — Encounter: Payer: Self-pay | Admitting: Family

## 2013-04-20 ENCOUNTER — Ambulatory Visit (INDEPENDENT_AMBULATORY_CARE_PROVIDER_SITE_OTHER): Payer: Medicare Other | Admitting: Family

## 2013-04-20 ENCOUNTER — Ambulatory Visit (HOSPITAL_COMMUNITY)
Admission: RE | Admit: 2013-04-20 | Discharge: 2013-04-20 | Disposition: A | Discharge status: Home / Self Care | Payer: Medicare Other | Source: Ambulatory Visit | Attending: Family | Admitting: Family

## 2013-04-20 DIAGNOSIS — Z48812 Encounter for surgical aftercare following surgery on the circulatory system: Secondary | ICD-10-CM | POA: Diagnosis not present

## 2013-04-20 DIAGNOSIS — I6529 Occlusion and stenosis of unspecified carotid artery: Secondary | ICD-10-CM | POA: Diagnosis not present

## 2013-04-20 NOTE — Addendum Note (Signed)
Addended by: Mena Goes on: 04/20/2013 03:43 PM   Modules accepted: Orders

## 2013-04-20 NOTE — Progress Notes (Signed)
Established Carotid Patient  Previous Carotid surgery: Yes Surgeon:Fields  History of Present Illness  Brad Day is a 72 y.o. male patient of Dr. Oneida Alar status post left CEA in 2007. Had 4 TIA's as manifested by expressive aphasia,  just before CEA, none since the CEA. Patient denies cardiac problems. He strained his left arm lifting heavy items a few days ago and has moderately painful left elbow and ulnar area.  The patient denies amaurosis fugax or monocular blindness.  The patient  denies facial drooping.  Pt. denies hemiplegia.  The patient denies receptive or expressive aphasia.  Pt. denies extremity weakness.   Patient reports New Medical or Surgical History:both cataracts extraction with IOL's.  Pt Diabetic: Yes, states "doing fine" Pt smoker: former smoker, quit 1978  Pt meds include: Statin : Yes Betablocker: Yes ASA: No: due to history of GI ulcers with bleeding Other anticoagulants/antiplatelets: none   Past Medical History  Diagnosis Date  . Arthritis   . Carotid artery occlusion   . Stroke     Social History History  Substance Use Topics  . Smoking status: Former Smoker    Quit date: 07/06/1976  . Smokeless tobacco: Former Systems developer    Types: Chew  . Alcohol Use: No    Family History Family History  Problem Relation Age of Onset  . Stroke Mother   . Heart disease Father     Heart Disease before age 79  . Heart attack Father     X's 7    Surgical History Past Surgical History  Procedure Laterality Date  . Coronary artery bypass graft  2001    Dr. Cyndia Bent  . Carotid endarterectomy  2007    Left CEA  . Eye surgery Right Sept.  1, 2014    Cataract  . Eye surgery Left Sept.   27, 2014    Cataract    Allergies  Allergen Reactions  . Asa [Aspirin] Other (See Comments)    Causes bleeding Ulcer    Current Outpatient Prescriptions  Medication Sig Dispense Refill  . fish oil-omega-3 fatty acids 1000 MG capsule Take 1 g by mouth daily.       Marland Kitchen lisinopril (PRINIVIL,ZESTRIL) 10 MG tablet Take 10 mg by mouth daily.      . metFORMIN (GLUCOPHAGE) 500 MG tablet Take 500 mg by mouth 2 (two) times daily with a meal.      . metoprolol (LOPRESSOR) 50 MG tablet Take 50 mg by mouth 2 (two) times daily.      . niacin 500 MG tablet Take 500 mg by mouth daily with breakfast.      . pantoprazole (PROTONIX) 40 MG tablet Take 40 mg by mouth daily.      . simvastatin (ZOCOR) 40 MG tablet Take 40 mg by mouth every evening.       No current facility-administered medications for this visit.    Review of Systems : [x]  Positive   [ ]  Denies  General:[ ]  Weight loss,  [ ]  Weight gain, [ ]  Loss of appetite, [ ]  Fever, [ ]  chills  Neurologic: [ ]  Dizziness, [ ]  Blackouts, [ ]  Headaches, [ ]  Seizure [ ]  Stroke, [ ]  "Mini stroke", [ ]  Slurred speech, [ ]  Temporary blindness;  [ ] weakness,  Ear/Nose/Throat: [ ]  Change in hearing, [ ]  Nose bleeds, [ ]  Hoarseness  Vascular:[ ]  Pain in legs with walking, [ ]  Pain in feet while lying flat , [ ]   Non-healing ulcer, [ ]  Blood  clot in vein,    Pulmonary: [ ]  Home oxygen, [ ]   Productive cough, [ ]  Bronchitis, [ ]  Coughing up blood,  [ ]  Asthma, [ ]  Wheezing  Musculoskeletal:  [ ]  Arthritis, [ ]  Joint pain, [ ]  low back pain  Cardiac: [ ]  Chest pain, [ ]  Shortness of breath when lying flat, [ ]  Shortness of breath with exertion, [ ]  Palpitations, [ ]  Heart murmur, [ ]   Atrial fibrillation  Hematologic:[ ]  Easy Bruising, [ ]  Anemia; [ ]  Hepatitis  Psychiatric: [ ]   Depression, [ ]  Anxiety   Gastrointestinal: [ ]  Black stool, [ ]  Blood in stool, [ ]  Peptic ulcer disease,  [ ]  Gastroesophageal Reflux, [ ]  Trouble swallowing, [ ]  Diarrhea, [ ]  Constipation  Urinary: [ ]  chronic Kidney disease, [ ]  on HD, [ ]  Burning with urination, [ ]  Frequent urination, [ ]  Difficulty urinating;   Skin: [ ]  Rashes, [ ]  Wounds    Physical Examination  Filed Vitals:   04/20/13 1347  BP: 139/86  Pulse: 76  Resp:     Filed Weights   04/20/13 1341  Weight: 255 lb (115.667 kg)   Body mass index is 36.59 kg/(m^2).   General: WDWN obese male in NAD GAIT: normal Eyes: PERRLA Pulmonary:  CTAB, Negative  Rales, Negative rhonchi, & Negative wheezing.  Cardiac: regular Rhythm ,  Negative Murmurs.  VASCULAR EXAM Carotid Bruits Left Right   Negative Negative    Aorta is not palpable. Radial pulses are 2+ palpable and equal.                                                                                                                            LE Pulses LEFT RIGHT       POPLITEAL  not palpable   not palpable       POSTERIOR TIBIAL   palpable    palpable        DORSALIS PEDIS      ANTERIOR TIBIAL not palpable   palpable     Gastrointestinal: soft, nontender, BS WNL, no r/g,  negative masses.  Musculoskeletal: Negative muscle atrophy/wasting. M/S 5/5 throughout except left upper extremity, Extremities without ischemic changes.  Neurologic: A&O X 3; Appropriate Affect ; SENSATION ;normal;  Speech is normal CN 2-12 intact except, Pain and light touch intact in extremities, Motor exam as listed above.   Non-Invasive Vascular Imaging CAROTID DUPLEX 04/20/2013   Right ICA: <40% stenosis. Left ICA: patent CEA site.  These findings are Unchanged from previous exam a year ago.  Assessment: Brad Day is a 72 y.o. male who presents with asymptomatic <40% right  ICA stenosis and patent left ICA (CEA site). The  ICA stenosis is  Unchanged from previous exam.  Plan: Follow-up in 1 year with Carotid Duplex scan.   I discussed in depth with the patient the nature of atherosclerosis, and emphasized the importance of maximal medical management including strict control  of blood pressure, blood glucose, and lipid levels, obtaining regular exercise, and continued cessation of smoking.  The patient is aware that without maximal medical management the underlying atherosclerotic disease process will  progress, limiting the benefit of any interventions. The patient was given information about stroke prevention and what symptoms should prompt the patient to seek immediate medical care. Thank you for allowing [place] to participate in this patient's care.  Clemon Chambers, RN, MSN, FNP-C [facility] [place] Office: [phone]  Clinic Physician: Oneida Alar  04/20/2013 1:58 PM

## 2013-04-20 NOTE — Patient Instructions (Signed)
Stroke Prevention Some medical conditions and behaviors are associated with an increased chance of having a stroke. You may prevent a stroke by making healthy choices and managing medical conditions. Reduce your risk of having a stroke by:  Staying physically active. Get at least 30 minutes of activity on most or all days.  Not smoking. It may also be helpful to avoid exposure to secondhand smoke.  Limiting alcohol use. Moderate alcohol use is considered to be:  No more than 2 drinks per day for men.  No more than 1 drink per day for nonpregnant women.  Eating healthy foods.  Include 5 or more servings of fruits and vegetables a day.  Certain diets may be prescribed to address high blood pressure, high cholesterol, diabetes, or obesity.  Managing your cholesterol levels.  A low-saturated fat, low-trans fat, low-cholesterol, and high-fiber diet may control cholesterol levels.  Take any prescribed medicines to control cholesterol as directed by your caregiver.  Managing your diabetes.  A controlled-carbohydrate, controlled-sugar diet is recommended to manage diabetes.  Take any prescribed medicines to control diabetes as directed by your caregiver.  Controlling your high blood pressure (hypertension).  A low-salt (sodium), low-saturated fat, low-trans fat, and low-cholesterol diet is recommended to manage high blood pressure.  Take any prescribed medicines to control hypertension as directed by your caregiver.  Maintaining a healthy weight.  A reduced-calorie, low-sodium, low-saturated fat, low-trans fat, low-cholesterol diet is recommended to manage weight.  Stopping drug abuse.  Avoiding birth control pills.  Talk to your caregiver about the risks of taking birth control pills if you are over 35 years old, smoke, get migraines, or have ever had a blood clot.  Getting evaluated for sleep disorders (sleep apnea).  Talk to your caregiver about getting a sleep evaluation  if you snore a lot or have excessive sleepiness.  Taking medicines as directed by your caregiver.  For some people, aspirin or blood thinners (anticoagulants) are helpful in reducing the risk of forming abnormal blood clots that can lead to stroke. If you have the irregular heart rhythm of atrial fibrillation, you should be on a blood thinner unless there is a good reason you cannot take them.  Understand all your medicine instructions. SEEK IMMEDIATE MEDICAL CARE IF:   You have sudden weakness or numbness of the face, arm, or leg, especially on one side of the body.  You have sudden confusion.  You have trouble speaking (aphasia) or understanding.  You have sudden trouble seeing in one or both eyes.  You have sudden trouble walking.  You have dizziness.  You have a loss of balance or coordination.  You have a sudden, severe headache with no known cause.  You have new chest pain or an irregular heartbeat. Any of these symptoms may represent a serious problem that is an emergency. Do not wait to see if the symptoms will go away. Get medical help right away. Call your local emergency services (911 in [place] Do not drive yourself to the hospital. Document Released: 07/30/2004 Document Revised: 09/14/2011 Document Reviewed: 02/09/2011 [employer] Patient Information 2014 [employer].  

## 2013-06-06 DIAGNOSIS — I2510 Atherosclerotic heart disease of native coronary artery without angina pectoris: Secondary | ICD-10-CM | POA: Diagnosis not present

## 2013-06-06 DIAGNOSIS — E669 Obesity, unspecified: Secondary | ICD-10-CM | POA: Diagnosis not present

## 2013-06-06 DIAGNOSIS — E782 Mixed hyperlipidemia: Secondary | ICD-10-CM | POA: Diagnosis not present

## 2013-06-06 DIAGNOSIS — E119 Type 2 diabetes mellitus without complications: Secondary | ICD-10-CM | POA: Diagnosis not present

## 2013-06-06 DIAGNOSIS — N183 Chronic kidney disease, stage 3 unspecified: Secondary | ICD-10-CM | POA: Diagnosis not present

## 2013-06-06 DIAGNOSIS — I10 Essential (primary) hypertension: Secondary | ICD-10-CM | POA: Diagnosis not present

## 2013-12-11 DIAGNOSIS — I10 Essential (primary) hypertension: Secondary | ICD-10-CM | POA: Diagnosis not present

## 2013-12-11 DIAGNOSIS — N183 Chronic kidney disease, stage 3 unspecified: Secondary | ICD-10-CM | POA: Diagnosis not present

## 2013-12-11 DIAGNOSIS — E1129 Type 2 diabetes mellitus with other diabetic kidney complication: Secondary | ICD-10-CM | POA: Diagnosis not present

## 2013-12-11 DIAGNOSIS — E782 Mixed hyperlipidemia: Secondary | ICD-10-CM | POA: Diagnosis not present

## 2014-04-25 ENCOUNTER — Encounter: Payer: Self-pay | Admitting: Family

## 2014-04-26 ENCOUNTER — Ambulatory Visit (HOSPITAL_COMMUNITY)
Admission: RE | Admit: 2014-04-26 | Discharge: 2014-04-26 | Disposition: A | Discharge status: Home / Self Care | Payer: Medicare Other | Source: Ambulatory Visit | Attending: Family | Admitting: Family

## 2014-04-26 ENCOUNTER — Encounter: Payer: Self-pay | Admitting: Family

## 2014-04-26 ENCOUNTER — Ambulatory Visit (INDEPENDENT_AMBULATORY_CARE_PROVIDER_SITE_OTHER): Payer: Medicare Other | Admitting: Family

## 2014-04-26 VITALS — BP 157/84 | HR 67 | Resp 16 | Ht 71.0 in | Wt 270.0 lb

## 2014-04-26 DIAGNOSIS — Z48812 Encounter for surgical aftercare following surgery on the circulatory system: Secondary | ICD-10-CM | POA: Insufficient documentation

## 2014-04-26 DIAGNOSIS — I6523 Occlusion and stenosis of bilateral carotid arteries: Secondary | ICD-10-CM | POA: Diagnosis not present

## 2014-04-26 DIAGNOSIS — I739 Peripheral vascular disease, unspecified: Principal | ICD-10-CM

## 2014-04-26 DIAGNOSIS — I779 Disorder of arteries and arterioles, unspecified: Secondary | ICD-10-CM | POA: Diagnosis not present

## 2014-04-26 NOTE — Addendum Note (Signed)
Addended by: Mena Goes on: 04/26/2014 03:42 PM   Modules accepted: Orders

## 2014-04-26 NOTE — Progress Notes (Signed)
Established Carotid Patient   History of Present Illness  Brad Day is a 73 y.o. male patient of Dr. Oneida Alar who is status post left CEA in 2007 and returns today for follow up.  He had 4 TIA's as manifested by expressive aphasia, just before CEA, none since the CEA.  Patient denies cardiac problems.   The patient denies amaurosis fugax or monocular blindness. The patient denies facial drooping.  Pt. denies hemiplegia. The patient denies receptive or expressive aphasia. Pt. denies extremity weakness.  He denies claudication symptoms with walking, denies non healing wounds.   Pt Diabetic: Yes, states "doing fine"  Pt smoker: former smoker, quit 1978  Pt meds include:  Statin : Yes  Betablocker: Yes  ASA: No: due to history of GI ulcers with bleeding  Other anticoagulants/antiplatelets: none   Past Medical History  Diagnosis Date  . Arthritis   . Carotid artery occlusion   . Stroke     Social History History  Substance Use Topics  . Smoking status: Former Smoker    Quit date: 07/06/1976  . Smokeless tobacco: Former Systems developer    Types: Chew  . Alcohol Use: No    Family History Family History  Problem Relation Age of Onset  . Stroke Mother   . Heart disease Father     Heart Disease before age 39  . Heart attack Father     X's 7    Surgical History Past Surgical History  Procedure Laterality Date  . Coronary artery bypass graft  2001    Dr. Cyndia Bent  . Carotid endarterectomy  2007    Left CEA  . Eye surgery Right Sept.  1, 2014    Cataract  . Eye surgery Left Sept.   27, 2014    Cataract    Allergies  Allergen Reactions  . Asa [Aspirin] Other (See Comments)    Causes bleeding Ulcer    Current Outpatient Prescriptions  Medication Sig Dispense Refill  . fish oil-omega-3 fatty acids 1000 MG capsule Take 1 g by mouth daily.      Marland Kitchen lisinopril (PRINIVIL,ZESTRIL) 10 MG tablet Take 10 mg by mouth daily.      . metFORMIN (GLUCOPHAGE) 500 MG tablet Take 500 mg by  mouth 2 (two) times daily with a meal.      . metoprolol (LOPRESSOR) 50 MG tablet Take 50 mg by mouth 2 (two) times daily.      . niacin 500 MG tablet Take 500 mg by mouth daily with breakfast.      . pantoprazole (PROTONIX) 40 MG tablet Take 40 mg by mouth daily.      . simvastatin (ZOCOR) 40 MG tablet Take 40 mg by mouth every evening.       No current facility-administered medications for this visit.    Review of Systems : See HPI for pertinent positives and negatives.  Physical Examination  Filed Vitals:   04/26/14 1320 04/26/14 1325  BP: 145/82 157/84  Pulse: 66 67  Resp: 16   Height: 5\' 11"  (1.803 m)   Weight: 270 lb (122.471 kg)    Body mass index is 37.67 kg/(m^2).  General: WDWN obese male in NAD  GAIT: normal  Eyes: PERRLA  Pulmonary: CTAB, Negative Rales, Negative rhonchi, & Negative wheezing.  Cardiac: regular Rhythm, no detected murmur.   VASCULAR EXAM  Carotid Bruits  Left  Right    Negative  Negative   Aorta is not palpable.  Radial pulses are 2+ palpable and equal.  LE Pulses  LEFT  RIGHT   POPLITEAL  not palpable  not palpable   POSTERIOR TIBIAL  2+palpable  2+palpable   DORSALIS PEDIS  ANTERIOR TIBIAL  1+ palpable  2+palpable    Gastrointestinal: soft, nontender, BS WNL, no r/g, no palpated masses.  Musculoskeletal: Negative muscle atrophy/wasting. M/S 5/5 throughout except left upper extremity, Extremities without ischemic changes.  Neurologic: A&O X 3; Appropriate Affect ; SENSATION ;normal;  Speech is normal  CN 2-12 intact except, Pain and light touch intact in extremities, Motor exam as listed above   Non-Invasive Vascular Imaging CAROTID DUPLEX 04/26/2014   CEREBROVASCULAR DUPLEX EVALUATION    INDICATION: Carotid artery disease    PREVIOUS INTERVENTION(S): Left carotid endarterectomy 03/18/2006.    DUPLEX EXAM: Carotid duplex    RIGHT  LEFT  Peak Systolic Velocities (cm/s) End Diastolic Velocities (cm/s) Plaque LOCATION Peak Systolic  Velocities (cm/s) End Diastolic Velocities (cm/s) Plaque  80 14 - CCA PROXIMAL 67 17 -  86 15 - CCA MID 83 23 -  86 17 HT CCA DISTAL 43 11 HM  117 10 - ECA 96 10 -  84 28 HT ICA PROXIMAL 52 14 HT  84 27 - ICA MID 51 16 -  62 22 - ICA DISTAL 56 17 -    .97 ICA / CCA Ratio (PSV) N/A  Antegrade Vertebral Flow Antegrade  0000000 Brachial Systolic Pressure (mmHg) 123456  Triphasic Brachial Artery Waveforms Triphasic    Plaque Morphology:  HM = Homogeneous, HT = Heterogeneous, CP = Calcific Plaque, SP = Smooth Plaque, IP = Irregular Plaque     ADDITIONAL FINDINGS:     IMPRESSION: 1. Less than 40% right internal carotid artery stenosis. 2. Patent left carotid endarterectomy site with no evidence for restenosis.    Compared to the previous exam:  No significant change       Assessment: Brad Day is a 73 y.o. male who is status post left CEA in 2007. He presents with asymptomatic minimal right ICA stenosis and a patent left ICA, endarterectomy site. No significant change from previous Duplex a year ago.   Plan: Follow-up in 1 year with Carotid Duplex scan.   I discussed in depth with the patient the nature of atherosclerosis, and emphasized the importance of maximal medical management including strict control of blood pressure, blood glucose, and lipid levels, obtaining regular exercise, and continued cessation of smoking.  The patient is aware that without maximal medical management the underlying atherosclerotic disease process will progress, limiting the benefit of any interventions. The patient was given information about stroke prevention and what symptoms should prompt the patient to seek immediate medical care. Thank you for allowing [place] to participate in this patient's care.  Clemon Chambers, RN, MSN, FNP-C [facility] Office: [phone]  Clinic Physician: Oneida Alar on call  04/26/2014 1:39 PM

## 2014-04-26 NOTE — Patient Instructions (Signed)
Stroke Prevention Some medical conditions and behaviors are associated with an increased chance of having a stroke. You may prevent a stroke by making healthy choices and managing medical conditions. HOW CAN I REDUCE MY RISK OF HAVING A STROKE?   Stay physically active. Get at least 30 minutes of activity on most or all days.  Do not smoke. It may also be helpful to avoid exposure to secondhand smoke.  Limit alcohol use. Moderate alcohol use is considered to be:  No more than 2 drinks per day for men.  No more than 1 drink per day for nonpregnant women.  Eat healthy foods. This involves:  Eating 5 or more servings of fruits and vegetables a day.  Making dietary changes that address high blood pressure (hypertension), high cholesterol, diabetes, or obesity.  Manage your cholesterol levels.  Making food choices that are high in fiber and low in saturated fat, trans fat, and cholesterol may control cholesterol levels.  Take any prescribed medicines to control cholesterol as directed by your health care provider.  Manage your diabetes.  Controlling your carbohydrate and sugar intake is recommended to manage diabetes.  Take any prescribed medicines to control diabetes as directed by your health care provider.  Control your hypertension.  Making food choices that are low in salt (sodium), saturated fat, trans fat, and cholesterol is recommended to manage hypertension.  Take any prescribed medicines to control hypertension as directed by your health care provider.  Maintain a healthy weight.  Reducing calorie intake and making food choices that are low in sodium, saturated fat, trans fat, and cholesterol are recommended to manage weight.  Stop drug abuse.  Avoid taking birth control pills.  Talk to your health care provider about the risks of taking birth control pills if you are over 35 years old, smoke, get migraines, or have ever had a blood clot.  Get evaluated for sleep  disorders (sleep apnea).  Talk to your health care provider about getting a sleep evaluation if you snore a lot or have excessive sleepiness.  Take medicines only as directed by your health care provider.  For some people, aspirin or blood thinners (anticoagulants) are helpful in reducing the risk of forming abnormal blood clots that can lead to stroke. If you have the irregular heart rhythm of atrial fibrillation, you should be on a blood thinner unless there is a good reason you cannot take them.  Understand all your medicine instructions.  Make sure that other conditions (such as anemia or atherosclerosis) are addressed. SEEK IMMEDIATE MEDICAL CARE IF:   You have sudden weakness or numbness of the face, arm, or leg, especially on one side of the body.  Your face or eyelid droops to one side.  You have sudden confusion.  You have trouble speaking (aphasia) or understanding.  You have sudden trouble seeing in one or both eyes.  You have sudden trouble walking.  You have dizziness.  You have a loss of balance or coordination.  You have a sudden, severe headache with no known cause.  You have new chest pain or an irregular heartbeat. Any of these symptoms may represent a serious problem that is an emergency. Do not wait to see if the symptoms will go away. Get medical help at once. Call your local emergency services (911 in [place] Do not drive yourself to the hospital. Document Released: 07/30/2004 Document Revised: 11/06/2013 Document Reviewed: 12/23/2012 [employer] Patient Information 2015 [employer]. This information is not intended to replace advice given   to you by your health care provider. Make sure you discuss any questions you have with your health care provider.  

## 2014-05-09 DIAGNOSIS — Z961 Presence of intraocular lens: Secondary | ICD-10-CM | POA: Diagnosis not present

## 2014-06-25 DIAGNOSIS — E8881 Metabolic syndrome: Secondary | ICD-10-CM | POA: Diagnosis not present

## 2014-06-25 DIAGNOSIS — E1122 Type 2 diabetes mellitus with diabetic chronic kidney disease: Secondary | ICD-10-CM | POA: Diagnosis not present

## 2014-06-25 DIAGNOSIS — N183 Chronic kidney disease, stage 3 (moderate): Secondary | ICD-10-CM | POA: Diagnosis not present

## 2014-06-25 DIAGNOSIS — E782 Mixed hyperlipidemia: Secondary | ICD-10-CM | POA: Diagnosis not present

## 2014-06-25 DIAGNOSIS — I1310 Hypertensive heart and chronic kidney disease without heart failure, with stage 1 through stage 4 chronic kidney disease, or unspecified chronic kidney disease: Secondary | ICD-10-CM | POA: Diagnosis not present

## 2014-06-25 DIAGNOSIS — I2510 Atherosclerotic heart disease of native coronary artery without angina pectoris: Secondary | ICD-10-CM | POA: Diagnosis not present

## 2014-12-31 DIAGNOSIS — E782 Mixed hyperlipidemia: Secondary | ICD-10-CM | POA: Diagnosis not present

## 2014-12-31 DIAGNOSIS — R739 Hyperglycemia, unspecified: Secondary | ICD-10-CM | POA: Diagnosis not present

## 2014-12-31 DIAGNOSIS — E781 Pure hyperglyceridemia: Secondary | ICD-10-CM | POA: Diagnosis not present

## 2014-12-31 DIAGNOSIS — I1310 Hypertensive heart and chronic kidney disease without heart failure, with stage 1 through stage 4 chronic kidney disease, or unspecified chronic kidney disease: Secondary | ICD-10-CM | POA: Diagnosis not present

## 2014-12-31 DIAGNOSIS — N183 Chronic kidney disease, stage 3 (moderate): Secondary | ICD-10-CM | POA: Diagnosis not present

## 2014-12-31 DIAGNOSIS — I2510 Atherosclerotic heart disease of native coronary artery without angina pectoris: Secondary | ICD-10-CM | POA: Diagnosis not present

## 2014-12-31 DIAGNOSIS — E1122 Type 2 diabetes mellitus with diabetic chronic kidney disease: Secondary | ICD-10-CM | POA: Diagnosis not present

## 2015-04-09 DIAGNOSIS — Z23 Encounter for immunization: Secondary | ICD-10-CM | POA: Diagnosis not present

## 2015-04-30 ENCOUNTER — Encounter: Payer: Self-pay | Admitting: Family

## 2015-05-01 DIAGNOSIS — W1781XA Fall down embankment (hill), initial encounter: Secondary | ICD-10-CM

## 2015-05-01 HISTORY — DX: Fall down embankment (hill), initial encounter: W17.81XA

## 2015-05-02 ENCOUNTER — Other Ambulatory Visit: Payer: Self-pay | Admitting: Family

## 2015-05-02 ENCOUNTER — Ambulatory Visit (HOSPITAL_COMMUNITY)
Admission: RE | Admit: 2015-05-02 | Discharge: 2015-05-02 | Disposition: A | Discharge status: Home / Self Care | Payer: Medicare Other | Source: Ambulatory Visit | Attending: Family | Admitting: Family

## 2015-05-02 ENCOUNTER — Encounter: Payer: Self-pay | Admitting: Family

## 2015-05-02 ENCOUNTER — Ambulatory Visit (INDEPENDENT_AMBULATORY_CARE_PROVIDER_SITE_OTHER): Payer: Medicare Other | Admitting: Family

## 2015-05-02 VITALS — BP 115/76 | HR 68 | Temp 97.5°F | Resp 16 | Ht 71.0 in | Wt 267.0 lb

## 2015-05-02 DIAGNOSIS — Z9889 Other specified postprocedural states: Secondary | ICD-10-CM | POA: Diagnosis not present

## 2015-05-02 DIAGNOSIS — Z48812 Encounter for surgical aftercare following surgery on the circulatory system: Secondary | ICD-10-CM

## 2015-05-02 DIAGNOSIS — I779 Disorder of arteries and arterioles, unspecified: Secondary | ICD-10-CM

## 2015-05-02 DIAGNOSIS — I739 Peripheral vascular disease, unspecified: Secondary | ICD-10-CM

## 2015-05-02 DIAGNOSIS — I6523 Occlusion and stenosis of bilateral carotid arteries: Secondary | ICD-10-CM

## 2015-05-02 DIAGNOSIS — I6521 Occlusion and stenosis of right carotid artery: Secondary | ICD-10-CM | POA: Insufficient documentation

## 2015-05-02 NOTE — Progress Notes (Signed)
Established Carotid Patient   History of Present Illness  Brad Day is a 74 y.o. male patient of Dr. Oneida Alar who is status post left CEA in 2007 and returns today for follow up.  He had 4 preoperative TIA's as manifested by expressive aphasia, none since the CEA.  Patient denies cardiac problems.   The patient denies any history of amaurosis fugax or monocular blindness, unilateral facial drooping, hemiplegia, or receptive or expressive aphasia.  He denies claudication symptoms with walking, denies non healing wounds.  He denies any steal symptoms in either arm/hand, denies dizziness.  He denies any new medical problems or surgeries.   Pt Diabetic: Yes, states "doing fine"  Pt smoker: former smoker, quit 1978   Pt meds include:  Statin : Yes  Betablocker: Yes  ASA: No: due to history of GI ulcers with bleeding  Other anticoagulants/antiplatelets: none     Past Medical History  Diagnosis Date  . Arthritis   . Carotid artery occlusion   . Stroke     Social History Social History  Substance Use Topics  . Smoking status: Former Smoker    Quit date: 07/06/1976  . Smokeless tobacco: Former Systems developer    Types: Chew  . Alcohol Use: No    Family History Family History  Problem Relation Age of Onset  . Stroke Mother   . Heart disease Father     Heart Disease before age 49  . Heart attack Father     X's 7    Surgical History Past Surgical History  Procedure Laterality Date  . Coronary artery bypass graft  2001    Dr. Cyndia Bent  . Carotid endarterectomy  2007    Left CEA  . Eye surgery Right Sept.  1, 2014    Cataract  . Eye surgery Left Sept.   27, 2014    Cataract    Allergies  Allergen Reactions  . Asa [Aspirin] Other (See Comments)    Causes bleeding Ulcer    Current Outpatient Prescriptions  Medication Sig Dispense Refill  . fish oil-omega-3 fatty acids 1000 MG capsule Take 1 g by mouth daily.    Marland Kitchen lisinopril (PRINIVIL,ZESTRIL) 10 MG tablet Take  10 mg by mouth daily.    . metFORMIN (GLUCOPHAGE) 500 MG tablet Take 500 mg by mouth 2 (two) times daily with a meal.    . metoprolol (LOPRESSOR) 50 MG tablet Take 50 mg by mouth 2 (two) times daily.    . niacin 500 MG tablet Take 500 mg by mouth daily with breakfast.    . pantoprazole (PROTONIX) 40 MG tablet Take 40 mg by mouth daily.    . simvastatin (ZOCOR) 40 MG tablet Take 40 mg by mouth every evening.     No current facility-administered medications for this visit.    Review of Systems : See HPI for pertinent positives and negatives.  Physical Examination  Filed Vitals:   05/02/15 1330 05/02/15 1333  BP: 117/70 115/76  Pulse: 67 68  Temp:  97.5 F (36.4 C)  TempSrc:  Oral  Resp:  16  Height:  5\' 11"  (1.803 m)  Weight:  267 lb (121.11 kg)  SpO2:  97%   Body mass index is 37.26 kg/(m^2).    General: WDWN obese male in NAD  GAIT: normal  Eyes: PERRLA  Pulmonary: CTAB, no rales, no rhonchi, & no wheezing.  Cardiac: regular rhythm, no detected murmur.  VASCULAR EXAM  Carotid Bruits  Left  Right    Negative  Negative   Aorta is not palpable.  Radial pulses are 2+ palpable and equal.   LE Pulses  LEFT  RIGHT   POPLITEAL  not palpable  not palpable   POSTERIOR TIBIAL  2+palpable  2+palpable   DORSALIS PEDIS  ANTERIOR TIBIAL  1+ palpable  2+palpable    Gastrointestinal: soft, nontender, BS WNL, no r/g, no palpated masses.  Musculoskeletal: No muscle atrophy/wasting. M/S 5/5 throughout, Extremities without ischemic changes.  Neurologic: A&O X 3; Appropriate Affect, normal sensation;  Speech is normal  CN 2-12 intact except, Pain and light touch intact in extremities, Motor exam as listed above          Non-Invasive Vascular Imaging CAROTID DUPLEX 05/02/2015   CEREBROVASCULAR DUPLEX EVALUATION    INDICATION: Carotid artery stenosis    PREVIOUS INTERVENTION(S): Left carotid endarterectomy 03/18/2006    DUPLEX EXAM:      RIGHT  LEFT  Peak Systolic Velocities (cm/s) End Diastolic Velocities (cm/s) Plaque LOCATION Peak Systolic Velocities (cm/s) End Diastolic Velocities (cm/s) Plaque  107 17  CCA PROXIMAL 82 14   111 21  CCA MID 81 22   88 19 HT CCA DISTAL 50 13   135 10  ECA 122 12   100 39 HT ICA PROXIMAL 57 17   88 30  ICA MID 61 18   99 26  ICA DISTAL 62 23     0.90 ICA / CCA Ratio (PSV) Carotid endarterectomy  Antegrade Vertebral Flow Antegrade  NA Brachial Systolic Pressure (mmHg) NA  NA Brachial Artery Waveforms NA    Plaque Morphology:  HM = Homogeneous, HT = Heterogeneous, CP = Calcific Plaque, SP = Smooth Plaque, IP = Irregular Plaque     ADDITIONAL FINDINGS: Right subclavian artery PSV 120cm/sec; Left subclavian artery PSV 254cm/sec    IMPRESSION: Right internal carotid artery stenosis present in the less than 40% range. Left internal carotid artery is patent with history of carotid endarterectomy, no hyperplasia or hemodynamically significant changes present.    Compared to the previous exam:  Essentially unchanged since study on 04/26/2014.     Assessment: Brad Day is a 74 y.o. male who is status post left CEA in 2007. He had 4 preoperative TIA's, none subsequently. Today's carotid duplex suggests right internal carotid artery stenosis present in the less than 40% range. Left internal carotid artery is patent with history of carotid endarterectomy, no hyperplasia or hemodynamically significant changes present. Essentially unchanged since study on 04/26/2014.    Plan: Follow-up in 1 year with Carotid Duplex scan.    I discussed in depth with the patient the nature of atherosclerosis, and emphasized the importance of maximal medical management including strict control of blood pressure, blood glucose, and lipid levels, obtaining regular exercise, and continued cessation of smoking.  The patient is aware that without maximal medical management the underlying atherosclerotic disease  process will progress, limiting the benefit of any interventions. The patient was given information about stroke prevention and what symptoms should prompt the patient to seek immediate medical care. Thank you for allowing [place] to participate in this patient's care.  Clemon Chambers, RN, MSN, FNP-C [facility] Office: [place]: Oneida Alar  05/02/2015 1:19 PM

## 2015-05-02 NOTE — Patient Instructions (Signed)
Stroke Prevention Some medical conditions and behaviors are associated with an increased chance of having a stroke. You may prevent a stroke by making healthy choices and managing medical conditions. HOW CAN I REDUCE MY RISK OF HAVING A STROKE?   Stay physically active. Get at least 30 minutes of activity on most or all days.  Do not smoke. It may also be helpful to avoid exposure to secondhand smoke.  Limit alcohol use. Moderate alcohol use is considered to be:  No more than 2 drinks per day for men.  No more than 1 drink per day for nonpregnant women.  Eat healthy foods. This involves:  Eating 5 or more servings of fruits and vegetables a day.  Making dietary changes that address high blood pressure (hypertension), high cholesterol, diabetes, or obesity.  Manage your cholesterol levels.  Making food choices that are high in fiber and low in saturated fat, trans fat, and cholesterol may control cholesterol levels.  Take any prescribed medicines to control cholesterol as directed by your health care provider.  Manage your diabetes.  Controlling your carbohydrate and sugar intake is recommended to manage diabetes.  Take any prescribed medicines to control diabetes as directed by your health care provider.  Control your hypertension.  Making food choices that are low in salt (sodium), saturated fat, trans fat, and cholesterol is recommended to manage hypertension.  Ask your health care provider if you need treatment to lower your blood pressure. Take any prescribed medicines to control hypertension as directed by your health care provider.  If you are 18-39 years of age, have your blood pressure checked every 3-5 years. If you are 40 years of age or older, have your blood pressure checked every year.  Maintain a healthy weight.  Reducing calorie intake and making food choices that are low in sodium, saturated fat, trans fat, and cholesterol are recommended to manage  weight.  Stop drug abuse.  Avoid taking birth control pills.  Talk to your health care provider about the risks of taking birth control pills if you are over 35 years old, smoke, get migraines, or have ever had a blood clot.  Get evaluated for sleep disorders (sleep apnea).  Talk to your health care provider about getting a sleep evaluation if you snore a lot or have excessive sleepiness.  Take medicines only as directed by your health care provider.  For some people, aspirin or blood thinners (anticoagulants) are helpful in reducing the risk of forming abnormal blood clots that can lead to stroke. If you have the irregular heart rhythm of atrial fibrillation, you should be on a blood thinner unless there is a good reason you cannot take them.  Understand all your medicine instructions.  Make sure that other conditions (such as anemia or atherosclerosis) are addressed. SEEK IMMEDIATE MEDICAL CARE IF:   You have sudden weakness or numbness of the face, arm, or leg, especially on one side of the body.  Your face or eyelid droops to one side.  You have sudden confusion.  You have trouble speaking (aphasia) or understanding.  You have sudden trouble seeing in one or both eyes.  You have sudden trouble walking.  You have dizziness.  You have a loss of balance or coordination.  You have a sudden, severe headache with no known cause.  You have new chest pain or an irregular heartbeat. Any of these symptoms may represent a serious problem that is an emergency. Do not wait to see if the symptoms will   go away. Get medical help at once. Call your local emergency services (911 in [place] Do not drive yourself to the hospital.   This information is not intended to replace advice given to you by your health care provider. Make sure you discuss any questions you have with your health care provider.   Document Released: 07/30/2004 Document Revised: 07/13/2014 Document Reviewed:  12/23/2012 Elsevier Interactive Patient Education 2016 Elsevier Inc.  

## 2015-05-02 NOTE — Addendum Note (Signed)
Addended by: Dorthula Rue L on: 05/02/2015 02:14 PM   Modules accepted: Orders

## 2015-06-14 ENCOUNTER — Emergency Department (HOSPITAL_COMMUNITY)
Admission: EM | Admit: 2015-06-14 | Discharge: 2015-06-15 | Disposition: A | Discharge status: Home / Self Care | Payer: Medicare Other | Attending: Emergency Medicine | Admitting: Emergency Medicine

## 2015-06-14 ENCOUNTER — Encounter (HOSPITAL_COMMUNITY): Payer: Self-pay | Admitting: Emergency Medicine

## 2015-06-14 ENCOUNTER — Emergency Department (HOSPITAL_COMMUNITY): Payer: Medicare Other

## 2015-06-14 DIAGNOSIS — Z87891 Personal history of nicotine dependence: Secondary | ICD-10-CM | POA: Insufficient documentation

## 2015-06-14 DIAGNOSIS — M1990 Unspecified osteoarthritis, unspecified site: Secondary | ICD-10-CM | POA: Insufficient documentation

## 2015-06-14 DIAGNOSIS — Y9389 Activity, other specified: Secondary | ICD-10-CM | POA: Diagnosis not present

## 2015-06-14 DIAGNOSIS — S4991XA Unspecified injury of right shoulder and upper arm, initial encounter: Secondary | ICD-10-CM | POA: Insufficient documentation

## 2015-06-14 DIAGNOSIS — Z8673 Personal history of transient ischemic attack (TIA), and cerebral infarction without residual deficits: Secondary | ICD-10-CM | POA: Diagnosis not present

## 2015-06-14 DIAGNOSIS — Y998 Other external cause status: Secondary | ICD-10-CM | POA: Diagnosis not present

## 2015-06-14 DIAGNOSIS — M25551 Pain in right hip: Secondary | ICD-10-CM | POA: Diagnosis not present

## 2015-06-14 DIAGNOSIS — Z951 Presence of aortocoronary bypass graft: Secondary | ICD-10-CM | POA: Diagnosis not present

## 2015-06-14 DIAGNOSIS — S79912A Unspecified injury of left hip, initial encounter: Secondary | ICD-10-CM | POA: Insufficient documentation

## 2015-06-14 DIAGNOSIS — M25511 Pain in right shoulder: Secondary | ICD-10-CM | POA: Diagnosis not present

## 2015-06-14 DIAGNOSIS — Y9289 Other specified places as the place of occurrence of the external cause: Secondary | ICD-10-CM | POA: Diagnosis not present

## 2015-06-14 DIAGNOSIS — Z79899 Other long term (current) drug therapy: Secondary | ICD-10-CM | POA: Diagnosis not present

## 2015-06-14 DIAGNOSIS — M25552 Pain in left hip: Secondary | ICD-10-CM | POA: Diagnosis not present

## 2015-06-14 DIAGNOSIS — R531 Weakness: Secondary | ICD-10-CM | POA: Diagnosis not present

## 2015-06-14 DIAGNOSIS — W01198A Fall on same level from slipping, tripping and stumbling with subsequent striking against other object, initial encounter: Secondary | ICD-10-CM | POA: Insufficient documentation

## 2015-06-14 DIAGNOSIS — M4806 Spinal stenosis, lumbar region: Secondary | ICD-10-CM | POA: Insufficient documentation

## 2015-06-14 DIAGNOSIS — W19XXXA Unspecified fall, initial encounter: Secondary | ICD-10-CM

## 2015-06-14 DIAGNOSIS — M48061 Spinal stenosis, lumbar region without neurogenic claudication: Secondary | ICD-10-CM

## 2015-06-14 DIAGNOSIS — M47816 Spondylosis without myelopathy or radiculopathy, lumbar region: Secondary | ICD-10-CM | POA: Diagnosis not present

## 2015-06-14 DIAGNOSIS — S79911A Unspecified injury of right hip, initial encounter: Secondary | ICD-10-CM | POA: Insufficient documentation

## 2015-06-14 MED ORDER — OXYCODONE-ACETAMINOPHEN 5-325 MG PO TABS: 1.0000 | ORAL_TABLET | ORAL | Status: DC | PRN

## 2015-06-14 MED ORDER — PREDNISONE 20 MG PO TABS
ORAL_TABLET | ORAL | Status: DC
Start: 2015-06-14 — End: 2015-12-23

## 2015-06-14 MED ORDER — PREDNISONE 20 MG PO TABS
60.0000 mg | ORAL_TABLET | Freq: Once | ORAL | Status: AC
  Administered 2015-06-14: 60 mg via ORAL
  Filled 2015-06-14: qty 3

## 2015-06-14 NOTE — ED Notes (Signed)
Pt fell five weeks ago, tripped and landed on his back and R shoulder, c/o bilateral hip pain and R shoulder pain. No obvious bruising or deformities, pt states he cant lift his R arm above his head. Denies dizziness.

## 2015-06-14 NOTE — ED Notes (Signed)
Pt remains in MRI at this time  

## 2015-06-14 NOTE — ED Provider Notes (Signed)
CSN: [id number]     Arrival date & time 06/14/15  1825 History  By signing my name below, I, Brad Day, attest that this documentation has been prepared under the direction and in the presence of Brad Worth, PA-C. Electronically Signed: Virgel Day, ED Scribe. 06/14/2015. 11:09 PM.   Chief Complaint  Patient presents with  . Fall  . Shoulder Pain  . Hip Pain   The history is provided by the patient. No language interpreter was used.   HPI Comments: Brad Day is a 74 y.o. male with hx of arthritis and 5x CABG who presents to the Emergency Department complaining of 5/10 bilateral pelvic pain and 4/10 right shoulder pain that began after a fall 5-6 weeks ago. Both areas of pain are throbbing/dull, non-radiating, and pt has not taken anything specifically for pain. Patient was walking next to a small ditch, slipped, landed on his buttocks and then his right shoulder. Patient was ambulatory after the fall, but began to have pain in his right shoulder and in both of his hips. Patient's wife at the bedside states that this has affected the patient's ability to easily ambulate, however he is so far refused to seek treatment until today. Per wife, patient takes 500 mg Tylenol BID for pain management of chronic knee pain, but this has done little to reduce the pain in his hips and his shoulder.   Past Medical History  Diagnosis Date  . Arthritis   . Carotid artery occlusion   . Stroke ([place])   . Fall down embankment Oct. 26, 2016    Hurt Right shoulder   Past Surgical History  Procedure Laterality Date  . Coronary artery bypass graft  2001    Dr. Cyndia Bent  . Carotid endarterectomy  2007    Left CEA  . Eye surgery Right Sept.  1, 2014    Cataract  . Eye surgery Left Sept.   27, 2014    Cataract   Family History  Problem Relation Age of Onset  . Stroke Mother   . Heart disease Father     Heart Disease before age 22  . Heart attack Father     X's 7   Social History   Substance Use Topics  . Smoking status: Former Smoker    Quit date: 07/06/1976  . Smokeless tobacco: Former Systems developer    Types: Chew  . Alcohol Use: No    Review of Systems  Musculoskeletal: Positive for arthralgias (Right shoulder, bilateral pelvis).  All other systems reviewed and are negative.     Allergies  Asa  Home Medications   Prior to Admission medications   Medication Sig Start Date End Date Taking? Authorizing Provider  atorvastatin (LIPITOR) 40 MG tablet  03/30/15   Historical Provider, MD  fish oil-omega-3 fatty acids 1000 MG capsule Take 1 g by mouth daily.    Historical Provider, MD  lisinopril (PRINIVIL,ZESTRIL) 10 MG tablet Take 10 mg by mouth daily.    Historical Provider, MD  metFORMIN (GLUCOPHAGE) 500 MG tablet Take 500 mg by mouth 2 (two) times daily with a meal.    Historical Provider, MD  metoprolol (LOPRESSOR) 50 MG tablet Take 50 mg by mouth 2 (two) times daily.    Historical Provider, MD  niacin 500 MG tablet Take 500 mg by mouth daily with breakfast.    Historical Provider, MD  oxyCODONE-acetaminophen (PERCOCET/ROXICET) 5-325 MG tablet Take 1 tablet by mouth every 4 (four) hours as needed for severe pain. 06/14/15  Jenniffer Vessels C Tallie Hevia, PA-C  pantoprazole (PROTONIX) 40 MG tablet Take 40 mg by mouth daily.    Historical Provider, MD  pioglitazone (ACTOS) 15 MG tablet  04/02/15   Historical Provider, MD  simvastatin (ZOCOR) 40 MG tablet Take 40 mg by mouth every evening.    Historical Provider, MD   BP 149/69 mmHg  Pulse 62  Temp(Src) 98.7 F (37.1 C) (Oral)  Resp 16  SpO2 98% Physical Exam  Constitutional: He is oriented to person, place, and time. He appears well-developed and well-nourished. No distress.  HENT:  Head: Normocephalic and atraumatic.  Eyes: Conjunctivae and EOM are normal. Pupils are equal, round, and reactive to light.  Neck: Normal range of motion. Neck supple. No tracheal deviation present.  Cardiovascular: Normal rate and intact distal  pulses.   Pulmonary/Chest: Effort normal. No respiratory distress.  Musculoskeletal: Normal range of motion.  Full range of motion in all extremities. No paraspinal tenderness.  Neurological: He is alert and oriented to person, place, and time. He has normal reflexes.  No sensory deficits. Strength 5/5 in all extremities. No gait disturbance.  Skin: Skin is warm and dry.  Psychiatric: He has a normal mood and affect. His behavior is normal.  Nursing note and vitals reviewed.   ED Course  Procedures   DIAGNOSTIC STUDIES: Oxygen Saturation is 97% on RA, normal by my interpretation.    COORDINATION OF CARE: 7:42 PM Will order x-ray of legs. Advised to follow-up with orthopaedist for right shoulder.Discussed treatment plan with pt at bedside and pt agreed to plan.   Labs Review Labs Reviewed - No data to display  Imaging Review Dg Shoulder Right  06/14/2015  CLINICAL DATA:  Fall while walking, slipped during walking, right shoulder pain EXAM: RIGHT SHOULDER - 2+ VIEW COMPARISON:  None. FINDINGS: Four views of the right shoulder submitted. No acute fracture or subluxation. Mild degenerative changes acromioclavicular joint. Glenohumeral joint is preserved. Status post median sternotomy. IMPRESSION: No acute fracture or subluxation. Mild degenerative changes AC joint. Electronically Signed   By: Brad Day M.D.   On: 06/14/2015 20:51   Dg Hips Bilat With Pelvis 3-4 Views  06/14/2015  CLINICAL DATA:  Fall with bilateral pelvic pain. Fall 5 weeks prior. EXAM: DG HIP (WITH OR WITHOUT PELVIS) 3-4V BILAT COMPARISON:  None. FINDINGS: The cortical margins of the bony pelvis are intact. No fracture. Pubic symphysis and sacroiliac joints are congruent. Both femoral heads are well-seated in the respective acetabula. Minimal degenerative change of both hips. IMPRESSION: No fracture or acute bony abnormality of the pelvis or hips. Electronically Signed   By: Brad Day M.D.   On: 06/14/2015 20:52    I have personally reviewed and evaluated these images and lab results as part of my medical decision-making.   EKG Interpretation None      MDM   Final diagnoses:  Fall, initial encounter  Right shoulder pain  Bilateral hip pain  Fall    Lavonne W Plesha presents with right shoulder and bilateral hip pain following a fall 5 weeks ago.  Findings and plan of care discussed with Deno Etienne, DO.  Suspect a bursitis or possibly a minor rotator cuff tear. Since patient has been ambulating since the fall and has had use of his shoulder as well, I do not suspect fractures, however patient is at risk due to his age and imaging is indicated. If x-rays are free from abnormalities, patient will be referred outpatient to an orthopedic surgeon for further evaluation.  Patient was offered pain management but declined. Patient was advised to tell me if he changes his mind about pain management. No abnormalities noted on shoulder and pelvic imaging. Patient to be discharged and instructed to follow-up with an orthopedic surgeon for further evaluation. This plan of care was communicated to the patient, who agreed to the plan, and is comfortable with discharge. When patient was evaluated by Dr. Tyrone Nine, he now states that he has pain extending from his back down the back of both of his legs. Patient denies any saddle anesthesias, numbness/tingling, urinary incontinence or retention, or weakness. Despite this, further imaging is indicated and an MRI was ordered. If the results of the MRI indicated no abnormalities, patient will be discharged under the original plan. Dr. Tyrone Nine agreed to dispo this patient once the MRI results are received. End of shift patient care handed off to Dr. Tyrone Nine at 11:08 PM.  I personally performed the services described in this documentation, which was scribed in my presence. The recorded information has been reviewed and is accurate.    Lorayne Bender, PA-C 06/14/15 2120  Lorayne Bender, PA-C 06/14/15 [place], DO 06/15/15 [id number]

## 2015-06-14 NOTE — ED Provider Notes (Signed)
Medical screening examination/treatment/procedure(s) were conducted as a shared visit with non-physician practitioner(s) and myself.  I personally evaluated the patient during the encounter.   EKG Interpretation None       See the written copy of this report in the patient's paper medical record.  These results did not interface directly into the electronic medical record and are summarized here.  74 yo M with a chief complaint of a fall. Patient fell into a ditch about 5 or 6 weeks ago landed on his left hip and that his right shoulder. Since then patient [place] However he has been having a lot of pain with walking bilateral sciatica and right shoulder pain worse when he tries to raise it above his head. On exam patient with no noted midline spinal tenderness. Supraspinatus muscle seemed weak compared to the others. Pain with passive range of motion at 90 of forward flexion. Pulse motor and sensation intact distally. The patient walks with a painful gait. No noted lower extremity weakness or hyperreflexia. Patient denies loss of bowel or bladder or loss of perirectal sensation. With bilateral sciatica we will obtain an MRI to rule out nerve compression.  CT scan without acute changes. Patient found to have significant spinal stenosis that is bilateral. This explains his current symptoms. We'll give him a burst dose of steroids have him take pain medicine follow-up with neurosurgery.   I have discussed the diagnosis/risks/treatment options with the patient and family and believe the pt to be eligible for discharge home to follow-up with PCP, neurosurgery. We also discussed returning to the ED immediately if new or worsening sx occur. We discussed the sx which are most concerning (e.g., sudden worsening pain, fever, cauda equina) that necessitate immediate return. Medications administered to the patient during their visit and any new prescriptions provided to  the patient are listed below.  Medications given during this visit Medications  predniSONE (DELTASONE) tablet 60 mg (60 mg Oral Given 06/14/15 2347)    Discharge Medication List as of 06/14/2015 11:38 PM    START taking these medications   Details  oxyCODONE-acetaminophen (PERCOCET/ROXICET) 5-325 MG tablet Take 1 tablet by mouth every 4 (four) hours as needed for severe pain., Starting 06/14/2015, Until Discontinued, Print    predniSONE (DELTASONE) 20 MG tablet 2 tabs po daily x 4 days, Print        The patient appears reasonably screen and/or stabilized for discharge and I doubt any other medical condition or other [facility] requiring further screening, evaluation, or treatment in the ED at this time prior to discharge.    Deno Etienne, DO 06/15/15 [phone]

## 2015-06-14 NOTE — Discharge Instructions (Signed)
You have been seen today for hip and shoulder pain following a fall. Your imaging showed no abnormalities. Follow up with PCP as needed. Return to ED should symptoms worsen. Follow-up with the orthopedic surgeon for further evaluation of these complaints. You may take Tylenol for pain.

## 2015-06-21 DIAGNOSIS — Z23 Encounter for immunization: Secondary | ICD-10-CM | POA: Diagnosis not present

## 2015-06-21 DIAGNOSIS — Z1389 Encounter for screening for other disorder: Secondary | ICD-10-CM | POA: Diagnosis not present

## 2015-06-21 DIAGNOSIS — B351 Tinea unguium: Secondary | ICD-10-CM | POA: Diagnosis not present

## 2015-06-21 DIAGNOSIS — K279 Peptic ulcer, site unspecified, unspecified as acute or chronic, without hemorrhage or perforation: Secondary | ICD-10-CM | POA: Diagnosis not present

## 2015-06-21 DIAGNOSIS — I714 Abdominal aortic aneurysm, without rupture: Secondary | ICD-10-CM | POA: Diagnosis not present

## 2015-06-21 DIAGNOSIS — Z9889 Other specified postprocedural states: Secondary | ICD-10-CM | POA: Diagnosis not present

## 2015-06-21 DIAGNOSIS — E1122 Type 2 diabetes mellitus with diabetic chronic kidney disease: Secondary | ICD-10-CM | POA: Diagnosis not present

## 2015-06-21 DIAGNOSIS — I779 Disorder of arteries and arterioles, unspecified: Secondary | ICD-10-CM | POA: Diagnosis not present

## 2015-06-21 DIAGNOSIS — N183 Chronic kidney disease, stage 3 (moderate): Secondary | ICD-10-CM | POA: Diagnosis not present

## 2015-06-21 DIAGNOSIS — I10 Essential (primary) hypertension: Secondary | ICD-10-CM | POA: Diagnosis not present

## 2015-06-21 DIAGNOSIS — E782 Mixed hyperlipidemia: Secondary | ICD-10-CM | POA: Diagnosis not present

## 2015-07-31 DIAGNOSIS — M25562 Pain in left knee: Secondary | ICD-10-CM | POA: Diagnosis not present

## 2015-07-31 DIAGNOSIS — M545 Low back pain: Secondary | ICD-10-CM | POA: Diagnosis not present

## 2015-07-31 DIAGNOSIS — M170 Bilateral primary osteoarthritis of knee: Secondary | ICD-10-CM | POA: Diagnosis not present

## 2015-07-31 DIAGNOSIS — M25561 Pain in right knee: Secondary | ICD-10-CM | POA: Diagnosis not present

## 2015-08-06 DIAGNOSIS — M545 Low back pain: Secondary | ICD-10-CM | POA: Diagnosis not present

## 2015-08-06 DIAGNOSIS — M4806 Spinal stenosis, lumbar region: Secondary | ICD-10-CM | POA: Diagnosis not present

## 2015-08-08 DIAGNOSIS — E119 Type 2 diabetes mellitus without complications: Secondary | ICD-10-CM | POA: Diagnosis not present

## 2015-08-08 DIAGNOSIS — Z961 Presence of intraocular lens: Secondary | ICD-10-CM | POA: Diagnosis not present

## 2015-08-08 DIAGNOSIS — H52203 Unspecified astigmatism, bilateral: Secondary | ICD-10-CM | POA: Diagnosis not present

## 2015-08-21 DIAGNOSIS — M4806 Spinal stenosis, lumbar region: Secondary | ICD-10-CM | POA: Diagnosis not present

## 2015-08-21 DIAGNOSIS — M545 Low back pain: Secondary | ICD-10-CM | POA: Diagnosis not present

## 2015-09-10 DIAGNOSIS — M545 Low back pain: Secondary | ICD-10-CM | POA: Diagnosis not present

## 2015-09-10 DIAGNOSIS — M4806 Spinal stenosis, lumbar region: Secondary | ICD-10-CM | POA: Diagnosis not present

## 2015-09-18 DIAGNOSIS — M545 Low back pain: Secondary | ICD-10-CM | POA: Diagnosis not present

## 2015-09-18 DIAGNOSIS — M4806 Spinal stenosis, lumbar region: Secondary | ICD-10-CM | POA: Diagnosis not present

## 2015-10-29 DIAGNOSIS — M4806 Spinal stenosis, lumbar region: Secondary | ICD-10-CM | POA: Diagnosis not present

## 2015-10-29 DIAGNOSIS — M7061 Trochanteric bursitis, right hip: Secondary | ICD-10-CM | POA: Diagnosis not present

## 2015-10-29 DIAGNOSIS — M545 Low back pain: Secondary | ICD-10-CM | POA: Diagnosis not present

## 2015-11-22 DIAGNOSIS — M25562 Pain in left knee: Secondary | ICD-10-CM | POA: Diagnosis not present

## 2015-12-16 DIAGNOSIS — K279 Peptic ulcer, site unspecified, unspecified as acute or chronic, without hemorrhage or perforation: Secondary | ICD-10-CM | POA: Diagnosis not present

## 2015-12-16 DIAGNOSIS — N183 Chronic kidney disease, stage 3 (moderate): Secondary | ICD-10-CM | POA: Diagnosis not present

## 2015-12-16 DIAGNOSIS — D649 Anemia, unspecified: Secondary | ICD-10-CM | POA: Diagnosis not present

## 2015-12-16 DIAGNOSIS — Z9889 Other specified postprocedural states: Secondary | ICD-10-CM | POA: Diagnosis not present

## 2015-12-16 DIAGNOSIS — I10 Essential (primary) hypertension: Secondary | ICD-10-CM | POA: Diagnosis not present

## 2015-12-16 DIAGNOSIS — I779 Disorder of arteries and arterioles, unspecified: Secondary | ICD-10-CM | POA: Diagnosis not present

## 2015-12-16 DIAGNOSIS — E782 Mixed hyperlipidemia: Secondary | ICD-10-CM | POA: Diagnosis not present

## 2015-12-16 DIAGNOSIS — E1122 Type 2 diabetes mellitus with diabetic chronic kidney disease: Secondary | ICD-10-CM | POA: Diagnosis not present

## 2015-12-16 DIAGNOSIS — Z01818 Encounter for other preprocedural examination: Secondary | ICD-10-CM | POA: Diagnosis not present

## 2015-12-16 DIAGNOSIS — I2510 Atherosclerotic heart disease of native coronary artery without angina pectoris: Secondary | ICD-10-CM | POA: Diagnosis not present

## 2015-12-23 ENCOUNTER — Ambulatory Visit (INDEPENDENT_AMBULATORY_CARE_PROVIDER_SITE_OTHER): Payer: Medicare Other | Admitting: Cardiology

## 2015-12-23 ENCOUNTER — Encounter: Payer: Self-pay | Admitting: Cardiology

## 2015-12-23 VITALS — BP 112/68 | HR 65 | Ht 70.0 in | Wt 274.2 lb

## 2015-12-23 DIAGNOSIS — Z01818 Encounter for other preprocedural examination: Secondary | ICD-10-CM

## 2015-12-23 NOTE — Patient Instructions (Signed)
Medication Instructions:  Your physician recommends that you continue on your current medications as directed. Please refer to the Current Medication list given to you today.  -- Please call Trinidad Curet, RN and let me know which statin mediation you are taking so that we may update your medication list and allergy list.   [phone]  --  Labwork: None ordered  Testing/Procedures: Your physician has requested that you have an echocardiogram. Echocardiography is a painless test that uses sound waves to create images of your heart. It provides your doctor with information about the size and shape of your heart and how well your heart's chambers and valves are working. This procedure takes approximately one hour. There are no restrictions for this procedure.  Follow-Up: To be determined once echocardiogram results have been reviewed by the physician.  We will call you with the results.  If you need a refill on your cardiac medications before your next appointment, please call your pharmacy.  Thank you for choosing [facility]!!   Trinidad Curet, RN [phone]

## 2015-12-23 NOTE — Progress Notes (Signed)
Cardiology Office Note   Date:  12/23/2015   ID:  AARSH BORTON, DOB 01-11-1941, MRN [id number]  PCP:  Abigail Miyamoto, MD  Cardiologist:   Constance Haw, MD    Chief Complaint  Patient presents with  . New Patient (Initial Visit)  . Shortness of Breath    only when walking he gets SOB and states his chest hurts when walking and have just ate      History of Present Illness: Brad Day is a 75 y.o. male who presents today for cardiology evaluation.   Presenting as a pre-op evaluation for possible left knee replacement.  History of CVA, bilateral carotid artery disease s/p left CEA and right <40% stenosis, CABG x5 in 2001.  Prior to his CABG, he was having a sharp pain in his chest that radiated to his left arm. Since his operation, he has had no episodes of chest pain. He does say that when he eats a large meal and gets up and moves around, he gets upper abdominal or chest pain but it is relieved with belching. He otherwise feels well. He sleeps on 2 pillows at night but thinks that he would be able to lie flat without issue. He has no PND symptoms. He does get a little short of breath when he walks but is been going on for the last couple years. 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   Today, he denies symptoms of palpitations, chest pain, shortness of breath, orthopnea, PND, lower extremity edema, claudication, dizziness, presyncope, syncope, bleeding, or neurologic sequela. The patient is tolerating medications without difficulties and is otherwise without complaint today.    Past Medical History  Diagnosis Date  . Arthritis   . Carotid artery occlusion   . Stroke ([place])   . Fall down embankment Oct. 26, 2016    Hurt Right shoulder   Past Surgical History  Procedure Laterality Date  . Coronary artery bypass graft  2001    Dr. Cyndia Bent  . Carotid endarterectomy  2007    Left CEA  . Eye surgery Right Sept.  1, 2014    Cataract  .  Eye surgery Left Sept.   27, 2014    Cataract     Current Outpatient Prescriptions  Medication Sig Dispense Refill  . atorvastatin (LIPITOR) 40 MG tablet     . fish oil-omega-3 fatty acids 1000 MG capsule Take 1 g by mouth 2 (two) times daily.     Marland Kitchen lisinopril (PRINIVIL,ZESTRIL) 10 MG tablet Take 10 mg by mouth daily.    . metFORMIN (GLUCOPHAGE) 500 MG tablet Take 500 mg by mouth 2 (two) times daily with a meal.    . metoprolol (LOPRESSOR) 50 MG tablet Take 50 mg by mouth 2 (two) times daily.    . niacin 500 MG tablet Take 500 mg by mouth daily with breakfast.    . oxyCODONE-acetaminophen (PERCOCET/ROXICET) 5-325 MG tablet Take 1 tablet by mouth every 4 (four) hours as needed for severe pain. 6 tablet 0  . pantoprazole (PROTONIX) 40 MG tablet Take 40 mg by mouth daily.    . pioglitazone (ACTOS) 15 MG tablet     . simvastatin (ZOCOR) 40 MG tablet Take 40 mg by mouth every evening.     No current facility-administered medications for this visit.    Allergies:   Asa   Social History:  The patient  reports that he quit smoking about 45  years ago. He has quit using smokeless tobacco. His smokeless tobacco use included Chew. He reports that he does not drink alcohol or use illicit drugs.   Family History:  The patient's family history includes Alzheimer's disease in his brother; Cancer in his brother; Emphysema in his sister; Heart attack in his father; Heart disease in his father; Other in his brother; Stroke in his mother.    ROS:  Please see the history of present illness.   Otherwise, review of systems is positive for leg swelling, leg pain, DOE, back pian, easy bruising.   All other systems are reviewed and negative.    PHYSICAL EXAM: VS:  BP 112/68 mmHg  Pulse 65  Ht 5\' 10"  (1.778 m)  Wt 274 lb 3.2 oz (124.376 kg)  BMI 39.34 kg/m2  SpO2 94% , BMI Body mass index is 39.34 kg/(m^2). GEN: Well nourished, well developed, in no acute distress HEENT: normal Neck: no JVD, carotid  bruits, or masses Cardiac: RRR; no murmurs, rubs, or gallops, 2 edema to the lower calf Respiratory:  clear to auscultation bilaterally, normal work of breathing GI: soft, nontender, nondistended, + BS MS: no deformity or atrophy Skin: warm and dry Neuro:  Strength and sensation are intact Psych: euthymic mood, full affect  EKG:  EKG is ordered today. The ekg ordered today shows sinus rhythm, RBBB, LAFB, rate 63   Recent Labs: No results found for requested labs within last 365 days.    Lipid Panel  No results found for: CHOL, TRIG, HDL, CHOLHDL, VLDL, LDLCALC, LDLDIRECT   Wt Readings from Last 3 Encounters:  12/23/15 274 lb 3.2 oz (124.376 kg)  05/02/15 267 lb (121.11 kg)  04/26/14 270 lb (122.471 kg)     ASSESSMENT AND PLAN:  1.  Preop evaluation: Currently no symptoms of obvious coronary disease as he has no chest pain. He does have some symptoms concerning for heart failure, as he does have significant lower extremity edema and dyspnea on exertion. Due to that we Zimere Dunlevy get an echocardiogram to determine if he has any decrease in his ejection fraction. If his echocardiogram shows a normal EF, he would be at intermediate risk for an intermediate risk procedure. Would recommend continuing his statin and beta blocker throughout the perioperative time. Should his EF be low, he may require further management.   Current medicines are reviewed at length with the patient today.   The patient does not have concerns regarding his medicines.  The following changes were made today:  none  Labs/ tests ordered today include:  Orders Placed This Encounter  Procedures  . EKG 12-Lead  . ECHOCARDIOGRAM COMPLETE     Disposition:   FU with Hetvi Shawhan PRN  Signed, Jailey Booton Meredith Leeds, MD  12/23/2015 1:53 PM     [address] [place] [address] [phone] (office) [phone] (fax)

## 2015-12-25 ENCOUNTER — Ambulatory Visit: Payer: Medicare Other | Admitting: Cardiology

## 2016-01-01 ENCOUNTER — Telehealth: Payer: Self-pay | Admitting: Cardiology

## 2016-01-01 NOTE — Telephone Encounter (Signed)
NEw Message  Pt stated he was to call today and speak w/ RN about hismedications. Please call back and discuss.

## 2016-01-01 NOTE — Telephone Encounter (Signed)
Patient informs me that he is taking Lipitor and that he is not taking Zocor. Thanked patient for updating [place] and removed Zocor from his med list.

## 2016-01-10 ENCOUNTER — Other Ambulatory Visit: Payer: Self-pay

## 2016-01-10 ENCOUNTER — Ambulatory Visit (HOSPITAL_COMMUNITY): Payer: Medicare Other | Attending: Cardiology

## 2016-01-10 DIAGNOSIS — R9431 Abnormal electrocardiogram [ECG] [EKG]: Secondary | ICD-10-CM | POA: Insufficient documentation

## 2016-01-10 DIAGNOSIS — Z951 Presence of aortocoronary bypass graft: Secondary | ICD-10-CM | POA: Diagnosis not present

## 2016-01-10 DIAGNOSIS — I351 Nonrheumatic aortic (valve) insufficiency: Secondary | ICD-10-CM | POA: Insufficient documentation

## 2016-01-10 DIAGNOSIS — Z01810 Encounter for preprocedural cardiovascular examination: Secondary | ICD-10-CM | POA: Diagnosis not present

## 2016-01-10 DIAGNOSIS — I517 Cardiomegaly: Secondary | ICD-10-CM | POA: Insufficient documentation

## 2016-01-10 DIAGNOSIS — Z01818 Encounter for other preprocedural examination: Secondary | ICD-10-CM | POA: Diagnosis not present

## 2016-01-13 ENCOUNTER — Telehealth: Payer: Self-pay | Admitting: *Deleted

## 2016-01-13 NOTE — Telephone Encounter (Signed)
Faxed cardiac clearance, for left total knee replacement,  to [facility] at [phone], attention Claiborne Billings.

## 2016-02-04 DIAGNOSIS — Z951 Presence of aortocoronary bypass graft: Secondary | ICD-10-CM

## 2016-02-04 DIAGNOSIS — M25562 Pain in left knee: Secondary | ICD-10-CM | POA: Diagnosis not present

## 2016-02-04 DIAGNOSIS — M1712 Unilateral primary osteoarthritis, left knee: Secondary | ICD-10-CM

## 2016-02-04 DIAGNOSIS — Z8673 Personal history of transient ischemic attack (TIA), and cerebral infarction without residual deficits: Secondary | ICD-10-CM

## 2016-02-04 NOTE — H&P (Signed)
PREOPERATIVE H&P Patient ID: Brad Day MRN: [id number] DOB/AGE: 74-05-1941 74 y.o.  Chief Complaint: OA LEFT KNEE  Planned Procedure Date: 02/25/16 Medical Clearance by Dr. Olen Pel Cardiac Clearance by Dr. Curt Bears Additional clearance by Vascular: Dr. Oneida Alar   HPI: Brad Day is a 75 y.o. male with a history of CVA, b/l carotid artery disease s/p Left CEA and right <40% stenosis, CABG x5 in 2001, COPD, and DM who presents for evaluation of OA LEFT KNEE. The patient has a history of pain and functional disability in the left knee due to arthritis and has failed non-surgical conservative treatments for greater than 12 weeks to include NSAID's and/or analgesics, corticosteriod injections and activity modification.  Onset of symptoms was gradual, starting >10 years ago with gradually worsening course since that time.  Patient currently rates pain at 6 out of 10 with activity. Patient has worsening of pain with activity and weight bearing, pain that interferes with activities of daily living and pain with passive range of motion.  Four views of bilateral knees demonstrate severe osteoarthritis in bilateral knees, primarily in the anteromedial compartments, but it has progressed to the lateral as well. There is no active infection.  Past Medical History:  Diagnosis Date  . Arthritis   . Carotid artery occlusion   . Fall down embankment Oct. 26, 2016   Hurt Right shoulder  . Stroke [facility])    Past Surgical History:  Procedure Laterality Date  . CAROTID ENDARTERECTOMY  2007   Left CEA  . CORONARY ARTERY BYPASS GRAFT  2001   Dr. Cyndia Bent  . EYE SURGERY Right Sept.  1, 2014   Cataract  . EYE SURGERY Left Sept.   27, 2014   Cataract   Allergies  Allergen Reactions  . Asa [Aspirin] Other (See Comments)    Causes bleeding Ulcer   Prior to Admission medications   Medication Sig Start Date End Date Taking? Authorizing Provider  atorvastatin (LIPITOR) 40 MG tablet  03/30/15   Historical  Provider, MD  fish oil-omega-3 fatty acids 1000 MG capsule Take 1 g by mouth 2 (two) times daily.     Historical Provider, MD  lisinopril (PRINIVIL,ZESTRIL) 10 MG tablet Take 10 mg by mouth daily.    Historical Provider, MD  metFORMIN (GLUCOPHAGE) 500 MG tablet Take 500 mg by mouth 2 (two) times daily with a meal.    Historical Provider, MD  metoprolol (LOPRESSOR) 50 MG tablet Take 50 mg by mouth 2 (two) times daily.    Historical Provider, MD  niacin 500 MG tablet Take 500 mg by mouth daily with breakfast.    Historical Provider, MD  oxyCODONE-acetaminophen (PERCOCET/ROXICET) 5-325 MG tablet Take 1 tablet by mouth every 4 (four) hours as needed for severe pain. 06/14/15   Shawn C Joy, PA-C  pantoprazole (PROTONIX) 40 MG tablet Take 40 mg by mouth daily.    Historical Provider, MD  pioglitazone (ACTOS) 15 MG tablet  04/02/15   Historical Provider, MD   Social History   Social History  . Marital status: Married    Spouse name: N/A  . Number of children: N/A  . Years of education: N/A   Social History Main Topics  . Smoking status: Former Smoker    Quit date: 07/06/1976  . Smokeless tobacco: Former Systems developer    Types: Chew  . Alcohol use No  . Drug use: No  . Sexual activity: Not on file   Other Topics Concern  . Not on file   Social  History Narrative  . No narrative on file   Family History  Problem Relation Age of Onset  . Stroke Mother   . Heart disease Father     Heart Disease before age 25  . Heart attack Father     X's 22  . Emphysema Sister   . Other Brother     train accident   . Cancer Brother   . Alzheimer's disease Brother     ROS: Currently denies lightheadedness, dizziness, Fever, chills, CP, SOB.   No personal history of DVT, PE. No teeth.  Loose teeth or dentures All other systems have been reviewed and were otherwise currently negative with the exception of those mentioned in the HPI and as above.  Objective: Vitals: Ht: 5'10" Wt: 275 Temp: 97.4 BP: 119/69  Pulse: 66 O2 98%% on room air. Physical Exam: General: Alert, NAD.  Antalgic Gait.  Utilizes 4-pronged walker. HEENT: EOMI, Good Neck Extension  Pulm: No increased work of breathing.  Clear B/L A/P w/o crackle or wheeze.  CV: RRR, No m/g/r appreciated  GI: soft, NT, ND Neuro: Neuro grossly intact b/l upper/lower ext.  Sensation intact distally Skin: No lesions in the area of chief complaint MSK/Surgical Site: Left knee w/o redness or effusion.  + JLT. ROM 5-95.  5/5 strength in extension and flexion.  +EHL/FHL.  NVI.  +B/L Edema to knees. Stable Ligament exam.  Varus deformity.   Imaging Review Four views of bilateral knees demonstrate severe osteoarthritis in bilateral knees, greater in the anteromedial compartments..    Assessment: OA LEFT KNEE Active Problems:   Bilateral carotid artery disease ([place])   Plan: Plan for Procedure(s): TOTAL KNEE ARTHROPLASTY  The patient history, physical exam, clinical judgement of the provider and imaging are consistent with end stage degenerative joint disease and total joint arthroplasty is deemed medically necessary. The treatment options including medical management, injection therapy, and arthroplasty were discussed at length. The risks and benefits of Procedure(s): TOTAL KNEE ARTHROPLASTY were presented and reviewed.  The risks of nonoperative treatment, versus surgical intervention including but not limited to continued pain, aseptic loosening, stiffness, dislocation/subluxation, infection, bleeding, nerve injury, blood clots, cardiopulmonary complications, morbidity, mortality, among others were discussed. The patient verbalizes understanding and wishes to proceed with the plan.  Patient is being admitted for inpatient treatment for surgery, pain control, PT, OT, prophylactic antibiotics, VTE prophylaxis, progressive ambulation, ADL's and discharge planning.   Dental prophylaxis discussed and recommended for 2 years postoperatively.  The  patient does meet the criteria for TXA which will be used perioperatively via IV.   Xarelto will be used postoperatively for DVT prophylaxis in addition to SCDs, and early ambulation. The patient is planning to be discharged home with home health services in care of his wife. Moderate CV Risk.  Continue BB / Statin.  Charna Elizabeth Martensen III,PA-C 02/04/2016 8:24 AM

## 2016-02-14 ENCOUNTER — Encounter (HOSPITAL_COMMUNITY)
Admission: RE | Admit: 2016-02-14 | Discharge: 2016-02-14 | Disposition: A | Discharge status: Home / Self Care | Payer: Medicare Other | Source: Ambulatory Visit | Attending: Orthopedic Surgery | Admitting: Orthopedic Surgery

## 2016-02-14 ENCOUNTER — Encounter (HOSPITAL_COMMUNITY): Payer: Self-pay

## 2016-02-14 DIAGNOSIS — M1712 Unilateral primary osteoarthritis, left knee: Secondary | ICD-10-CM | POA: Insufficient documentation

## 2016-02-14 DIAGNOSIS — Z8673 Personal history of transient ischemic attack (TIA), and cerebral infarction without residual deficits: Secondary | ICD-10-CM | POA: Diagnosis not present

## 2016-02-14 DIAGNOSIS — I10 Essential (primary) hypertension: Secondary | ICD-10-CM | POA: Diagnosis not present

## 2016-02-14 DIAGNOSIS — Z79899 Other long term (current) drug therapy: Secondary | ICD-10-CM | POA: Diagnosis not present

## 2016-02-14 DIAGNOSIS — Z87891 Personal history of nicotine dependence: Secondary | ICD-10-CM | POA: Diagnosis not present

## 2016-02-14 DIAGNOSIS — Z0183 Encounter for blood typing: Secondary | ICD-10-CM | POA: Diagnosis not present

## 2016-02-14 DIAGNOSIS — Z01818 Encounter for other preprocedural examination: Secondary | ICD-10-CM | POA: Insufficient documentation

## 2016-02-14 DIAGNOSIS — Z951 Presence of aortocoronary bypass graft: Secondary | ICD-10-CM | POA: Insufficient documentation

## 2016-02-14 DIAGNOSIS — Z7984 Long term (current) use of oral hypoglycemic drugs: Secondary | ICD-10-CM | POA: Insufficient documentation

## 2016-02-14 DIAGNOSIS — Z01812 Encounter for preprocedural laboratory examination: Secondary | ICD-10-CM | POA: Diagnosis not present

## 2016-02-14 DIAGNOSIS — I2510 Atherosclerotic heart disease of native coronary artery without angina pectoris: Secondary | ICD-10-CM | POA: Insufficient documentation

## 2016-02-14 DIAGNOSIS — E119 Type 2 diabetes mellitus without complications: Secondary | ICD-10-CM | POA: Diagnosis not present

## 2016-02-14 HISTORY — DX: Reserved for inherently not codable concepts without codable children: IMO0001

## 2016-02-14 HISTORY — DX: Personal history of peptic ulcer disease: Z87.11

## 2016-02-14 HISTORY — DX: Essential (primary) hypertension: I10

## 2016-02-14 HISTORY — DX: Atherosclerotic heart disease of native coronary artery without angina pectoris: I25.10

## 2016-02-14 HISTORY — DX: Type 2 diabetes mellitus without complications: E11.9

## 2016-02-14 LAB — URINE MICROSCOPIC-ADD ON

## 2016-02-14 LAB — PROTIME-INR
INR: 1.05
Prothrombin Time: 13.7 seconds (ref 11.4–15.2)

## 2016-02-14 LAB — BASIC METABOLIC PANEL
Anion gap: 11 (ref 5–15)
BUN: 22 mg/dL — AB (ref 6–20)
CHLORIDE: 107 mmol/L (ref 101–111)
CO2: 20 mmol/L — AB (ref 22–32)
CREATININE: 1.44 mg/dL — AB (ref 0.61–1.24)
Calcium: 9.7 mg/dL (ref 8.9–10.3)
GFR calc Af Amer: 54 mL/min — ABNORMAL LOW (ref >60)
GFR calc non Af Amer: 46 mL/min — ABNORMAL LOW (ref >60)
GLUCOSE: 137 mg/dL — AB (ref 65–99)
POTASSIUM: 4.6 mmol/L (ref 3.5–5.1)
SODIUM: 138 mmol/L (ref 135–145)

## 2016-02-14 LAB — URINALYSIS, ROUTINE W REFLEX MICROSCOPIC
Bilirubin Urine: NEGATIVE
GLUCOSE, UA: NEGATIVE mg/dL
Hgb urine dipstick: NEGATIVE
KETONES UR: NEGATIVE mg/dL
NITRITE: NEGATIVE
PH: 5.5 (ref 5.0–8.0)
Protein, ur: NEGATIVE mg/dL
Specific Gravity, Urine: 1.02 (ref 1.005–1.030)

## 2016-02-14 LAB — CBC
HEMATOCRIT: 40.9 % (ref 39.0–52.0)
Hemoglobin: 13.3 g/dL (ref 13.0–17.0)
MCH: 30.4 pg (ref 26.0–34.0)
MCHC: 32.5 g/dL (ref 30.0–36.0)
MCV: 93.6 fL (ref 78.0–100.0)
PLATELETS: 181 10*3/uL (ref 150–400)
RBC: 4.37 MIL/uL (ref 4.22–5.81)
RDW: 13.9 % (ref 11.5–15.5)
WBC: 8 10*3/uL (ref 4.0–10.5)

## 2016-02-14 LAB — APTT: aPTT: 34 seconds (ref 24–36)

## 2016-02-14 LAB — SURGICAL PCR SCREEN
MRSA, PCR: NEGATIVE
Staphylococcus aureus: NEGATIVE

## 2016-02-14 LAB — TYPE AND SCREEN
ABO/RH(D): O POS
Antibody Screen: NEGATIVE

## 2016-02-14 LAB — GLUCOSE, CAPILLARY: GLUCOSE-CAPILLARY: 162 mg/dL — AB (ref 65–99)

## 2016-02-14 NOTE — Pre-Procedure Instructions (Signed)
Brad Day  02/14/2016      [place], [facility] [address] [place] [address] Phone: [phone] Fax: [phone]    Your procedure is scheduled on Aug. 22  Report to [facility] Admitting at 10:00 A.M.  Call this number if you have problems the morning of surgery:  [phone]   Remember:  Do not eat food or drink liquids after midnight on aug. 21   Take these medicines the morning of surgery with A SIP OF WATER :metoprolol (lopressor), pantoprazole (protonix), oxycodone if needed              Stop 1 week prior to surgery aspirin, NSAIDS:aleve, ibuprofen, motrin, BC powders, goody's, vitamins and herbal medicines     How to Manage Your Diabetes Before and After Surgery  Why is it important to control my blood sugar before and after surgery? . Improving blood sugar levels before and after surgery helps healing and can limit problems. . A way of improving blood sugar control is eating a healthy diet by: o  Eating less sugar and carbohydrates o  Increasing activity/exercise o  Talking with your doctor about reaching your blood sugar goals . High blood sugars (greater than 180 mg/dL) can raise your risk of infections and slow your recovery, so you will need to focus on controlling your diabetes during the weeks before surgery. . Make sure that the doctor who takes care of your diabetes knows about your planned surgery including the date and location.  How do I manage my blood sugar before surgery? . Check your blood sugar at least 4 times a day, starting 2 days before surgery, to make sure that the level is not too high or low. o Check your blood sugar the morning of your surgery when you wake up and every 2 hours until you get to the Short Stay unit. . If your blood sugar is less than 70 mg/dL, you will need to treat for low blood sugar: o Do not take insulin. o Treat a low blood sugar (less  than 70 mg/dL) with  cup of clear juice (cranberry or apple), 4 glucose tablets, OR glucose gel. o Recheck blood sugar in 15 minutes after treatment (to make sure it is greater than 70 mg/dL). If your blood sugar is not greater than 70 mg/dL on recheck, call [phone] for further instructions. . Report your blood sugar to the short stay nurse when you get to Short Stay.  . If you are admitted to the hospital after surgery: o Your blood sugar will be checked by the staff and you will probably be given insulin after surgery (instead of oral diabetes medicines) to make sure you have good blood sugar levels. o The goal for blood sugar control after surgery is 80-180 mg/dL.              WHAT DO I DO ABOUT MY DIABETES MEDICATION?   Marland Kitchen Do not take oral diabetes medicines (pills) the morning of surgery.    Do not wear jewelry, make-up or nail polish.  Do not wear lotions, powders, or perfumes.  You may wear deoderant.  Do not shave 48 hours prior to surgery.  Men may shave face and neck.  Do not bring valuables to the hospital.  [facility] is not responsible for any belongings or valuables.  Contacts, dentures or bridgework may not be worn into surgery.  Leave  your suitcase in the car.  After surgery it may be brought to your room.  For patients admitted to the hospital, discharge time will be determined by your treatment team.     Special instructions:  Review preparing for surgery  Please read over the following fact sheets that you were given. MRSA Information

## 2016-02-14 NOTE — Progress Notes (Signed)
PCP:Dr. Elesa Hacker @ [facility] in [place]  No cardiologist. Pt. Stated Dr. Debroah Loop office sent him to one to get clearance. Doesn't remember his name or what test were done. In epic Dr. Allegra Lai to send clearance. I will call Dr. Debroah Loop office for there records.  Pt. Doesn't check blood sugars at home.

## 2016-02-15 LAB — HEMOGLOBIN A1C
HEMOGLOBIN A1C: 8 % — AB (ref 4.8–5.6)
MEAN PLASMA GLUCOSE: 183 mg/dL

## 2016-02-15 LAB — URINE CULTURE: CULTURE: NO GROWTH

## 2016-02-17 NOTE — Progress Notes (Signed)
Anesthesia Chart Review:  Pt is a 75 year old male scheduled for L total knee arthroplasty on 02/25/2016 with Edmonia Lynch, MD.   PCP is Marjean Donna, MD.   Lestine Box, MD with cardiology for pre-op eval; pt was cleared at intermediate risk.   PMH includes:  CAD (s/p CABG x5 2001), HTN, DM, carotid artery occlusion (s/p CEA 2007), stroke. Former smoker. BMI 39  Medications include: lipitor, lisinopril, metformin, metoprolol, protonix, pioglitazone  Preoperative labs reviewed.  HgbA1c 8.0, glucose 137.   EKG 12/23/15: NSR. RBBB. LAFB.   Echo 01/10/16:  - Left ventricle: The cavity size was normal. Wall thickness was increased in a pattern of moderate LVH. Systolic function was normal. The estimated ejection fraction was in the range of 55% to 60%. Wall motion was normal; there were no regional wall motion abnormalities. Left ventricular diastolic function parameters were normal. - Aortic valve: There was trivial regurgitation. - Left atrium: The atrium was mildly dilated. - Atrial septum: No defect or patent foramen ovale was identified.  Carotid duplex 05/02/15:   1. <40% R ICA stenosis 2. Patent L CEA   If no changes, I anticipate pt can proceed with surgery as scheduled.   Willeen Cass, FNP-BC [facility] Short Stay Surgical Center/Anesthesiology Phone: [phone] 02/17/2016 4:35 PM

## 2016-02-24 MED ORDER — TRANEXAMIC ACID 1000 MG/10ML IV SOLN
1000.0000 mg | INTRAVENOUS | Status: AC
  Administered 2016-02-25: 1000 mg via INTRAVENOUS
  Filled 2016-02-24: qty 10

## 2016-02-24 MED ORDER — DEXTROSE 5 % IV SOLN
3.0000 g | INTRAVENOUS | Status: AC
  Administered 2016-02-25: 2 g via INTRAVENOUS
  Filled 2016-02-24 (×2): qty 3000

## 2016-02-25 ENCOUNTER — Encounter (HOSPITAL_COMMUNITY): Payer: Self-pay | Admitting: Anesthesiology

## 2016-02-25 ENCOUNTER — Inpatient Hospital Stay (HOSPITAL_COMMUNITY): Payer: Medicare Other

## 2016-02-25 ENCOUNTER — Encounter (HOSPITAL_COMMUNITY)
Admission: RE | Disposition: A | Discharge status: Home Health | Payer: Self-pay | Source: Ambulatory Visit | Attending: Orthopedic Surgery

## 2016-02-25 ENCOUNTER — Inpatient Hospital Stay (HOSPITAL_COMMUNITY): Payer: Medicare Other | Admitting: Emergency Medicine

## 2016-02-25 ENCOUNTER — Inpatient Hospital Stay (HOSPITAL_COMMUNITY): Payer: Medicare Other | Admitting: Anesthesiology

## 2016-02-25 ENCOUNTER — Inpatient Hospital Stay (HOSPITAL_COMMUNITY)
Admission: RE | Admit: 2016-02-25 | Discharge: 2016-02-26 | DRG: 470 | Disposition: A | Discharge status: Home Health | Payer: Medicare Other | Source: Ambulatory Visit | Attending: Orthopedic Surgery | Admitting: Orthopedic Surgery

## 2016-02-25 DIAGNOSIS — I6523 Occlusion and stenosis of bilateral carotid arteries: Secondary | ICD-10-CM | POA: Diagnosis present

## 2016-02-25 DIAGNOSIS — Z87891 Personal history of nicotine dependence: Secondary | ICD-10-CM | POA: Diagnosis not present

## 2016-02-25 DIAGNOSIS — Z8673 Personal history of transient ischemic attack (TIA), and cerebral infarction without residual deficits: Secondary | ICD-10-CM

## 2016-02-25 DIAGNOSIS — I739 Peripheral vascular disease, unspecified: Secondary | ICD-10-CM

## 2016-02-25 DIAGNOSIS — J449 Chronic obstructive pulmonary disease, unspecified: Secondary | ICD-10-CM | POA: Diagnosis present

## 2016-02-25 DIAGNOSIS — Z7984 Long term (current) use of oral hypoglycemic drugs: Secondary | ICD-10-CM

## 2016-02-25 DIAGNOSIS — I10 Essential (primary) hypertension: Secondary | ICD-10-CM | POA: Diagnosis not present

## 2016-02-25 DIAGNOSIS — Z823 Family history of stroke: Secondary | ICD-10-CM

## 2016-02-25 DIAGNOSIS — M1712 Unilateral primary osteoarthritis, left knee: Secondary | ICD-10-CM

## 2016-02-25 DIAGNOSIS — I2510 Atherosclerotic heart disease of native coronary artery without angina pectoris: Secondary | ICD-10-CM | POA: Diagnosis not present

## 2016-02-25 DIAGNOSIS — Z951 Presence of aortocoronary bypass graft: Secondary | ICD-10-CM

## 2016-02-25 DIAGNOSIS — Z471 Aftercare following joint replacement surgery: Secondary | ICD-10-CM | POA: Diagnosis not present

## 2016-02-25 DIAGNOSIS — M170 Bilateral primary osteoarthritis of knee: Secondary | ICD-10-CM | POA: Diagnosis not present

## 2016-02-25 DIAGNOSIS — E1151 Type 2 diabetes mellitus with diabetic peripheral angiopathy without gangrene: Secondary | ICD-10-CM | POA: Diagnosis not present

## 2016-02-25 DIAGNOSIS — Z825 Family history of asthma and other chronic lower respiratory diseases: Secondary | ICD-10-CM | POA: Diagnosis not present

## 2016-02-25 DIAGNOSIS — Z886 Allergy status to analgesic agent status: Secondary | ICD-10-CM

## 2016-02-25 DIAGNOSIS — Z8249 Family history of ischemic heart disease and other diseases of the circulatory system: Secondary | ICD-10-CM

## 2016-02-25 DIAGNOSIS — D62 Acute posthemorrhagic anemia: Secondary | ICD-10-CM | POA: Diagnosis not present

## 2016-02-25 DIAGNOSIS — M179 Osteoarthritis of knee, unspecified: Secondary | ICD-10-CM | POA: Diagnosis not present

## 2016-02-25 DIAGNOSIS — I779 Disorder of arteries and arterioles, unspecified: Secondary | ICD-10-CM | POA: Diagnosis present

## 2016-02-25 DIAGNOSIS — G8918 Other acute postprocedural pain: Secondary | ICD-10-CM | POA: Diagnosis not present

## 2016-02-25 DIAGNOSIS — Z96652 Presence of left artificial knee joint: Secondary | ICD-10-CM | POA: Diagnosis not present

## 2016-02-25 HISTORY — PX: TOTAL KNEE ARTHROPLASTY: SHX125

## 2016-02-25 LAB — GLUCOSE, CAPILLARY
Glucose-Capillary: 159 mg/dL — ABNORMAL HIGH (ref 65–99)
Glucose-Capillary: 151 mg/dL — ABNORMAL HIGH (ref 65–99)

## 2016-02-25 SURGERY — ARTHROPLASTY, KNEE, TOTAL
Anesthesia: Regional | Site: Knee | Laterality: Left

## 2016-02-25 MED ORDER — FENTANYL CITRATE (PF) 100 MCG/2ML IJ SOLN
INTRAMUSCULAR | Status: AC
  Filled 2016-02-25: qty 2

## 2016-02-25 MED ORDER — MENTHOL 3 MG MT LOZG: 1.0000 | LOZENGE | OROMUCOSAL | Status: DC | PRN

## 2016-02-25 MED ORDER — ONDANSETRON HCL 4 MG/2ML IJ SOLN
INTRAMUSCULAR | Status: AC
  Filled 2016-02-25: qty 2

## 2016-02-25 MED ORDER — METOPROLOL TARTRATE 50 MG PO TABS
50.0000 mg | ORAL_TABLET | Freq: Two times a day (BID) | ORAL | Status: DC
  Administered 2016-02-25 – 2016-02-26 (×2): 50 mg via ORAL
  Filled 2016-02-25 (×2): qty 1

## 2016-02-25 MED ORDER — HYDROMORPHONE HCL 1 MG/ML IJ SOLN
INTRAMUSCULAR | Status: AC
  Filled 2016-02-25: qty 1

## 2016-02-25 MED ORDER — CEFAZOLIN SODIUM-DEXTROSE 2-4 GM/100ML-% IV SOLN
INTRAVENOUS | Status: AC
  Filled 2016-02-25: qty 100

## 2016-02-25 MED ORDER — DEXTROSE-NACL 5-0.45 % IV SOLN: INTRAVENOUS | Status: AC

## 2016-02-25 MED ORDER — METOCLOPRAMIDE HCL 5 MG/ML IJ SOLN: 5.0000 mg | Freq: Three times a day (TID) | INTRAMUSCULAR | Status: DC | PRN

## 2016-02-25 MED ORDER — FENTANYL CITRATE (PF) 100 MCG/2ML IJ SOLN
INTRAMUSCULAR | Status: DC | PRN
  Administered 2016-02-25 (×4): 50 ug via INTRAVENOUS

## 2016-02-25 MED ORDER — PROPOFOL 1000 MG/100ML IV EMUL
INTRAVENOUS | Status: AC
  Filled 2016-02-25: qty 200

## 2016-02-25 MED ORDER — LIDOCAINE 2% (20 MG/ML) 5 ML SYRINGE
INTRAMUSCULAR | Status: AC
  Filled 2016-02-25: qty 5

## 2016-02-25 MED ORDER — CHLORHEXIDINE GLUCONATE 4 % EX LIQD: 60.0000 mL | Freq: Once | CUTANEOUS | Status: DC

## 2016-02-25 MED ORDER — DIPHENHYDRAMINE HCL 12.5 MG/5ML PO ELIX: 12.5000 mg | ORAL_SOLUTION | ORAL | Status: DC | PRN

## 2016-02-25 MED ORDER — LACTATED RINGERS IV SOLN: INTRAVENOUS | Status: DC

## 2016-02-25 MED ORDER — SODIUM CHLORIDE FLUSH 0.9 % IV SOLN
INTRAVENOUS | Status: DC | PRN
  Administered 2016-02-25: 30 mL via INTRAVENOUS

## 2016-02-25 MED ORDER — NIACIN 500 MG PO TABS
500.0000 mg | ORAL_TABLET | Freq: Every day | ORAL | Status: DC
  Administered 2016-02-26: 500 mg via ORAL
  Filled 2016-02-25: qty 1

## 2016-02-25 MED ORDER — METHOCARBAMOL 500 MG PO TABS: 500.0000 mg | ORAL_TABLET | Freq: Four times a day (QID) | ORAL | 0 refills | Status: DC | PRN

## 2016-02-25 MED ORDER — OXYCODONE-ACETAMINOPHEN 5-325 MG PO TABS: 1.0000 | ORAL_TABLET | ORAL | 0 refills | Status: DC | PRN

## 2016-02-25 MED ORDER — METOCLOPRAMIDE HCL 5 MG PO TABS: 5.0000 mg | ORAL_TABLET | Freq: Three times a day (TID) | ORAL | Status: DC | PRN

## 2016-02-25 MED ORDER — DOCUSATE SODIUM 100 MG PO CAPS
100.0000 mg | ORAL_CAPSULE | Freq: Two times a day (BID) | ORAL | Status: DC
  Administered 2016-02-25 – 2016-02-26 (×2): 100 mg via ORAL
  Filled 2016-02-25 (×2): qty 1

## 2016-02-25 MED ORDER — RIVAROXABAN 10 MG PO TABS: 10.0000 mg | ORAL_TABLET | Freq: Every day | ORAL | 0 refills | Status: DC

## 2016-02-25 MED ORDER — ACETAMINOPHEN 500 MG PO TABS
ORAL_TABLET | ORAL | Status: AC
  Filled 2016-02-25: qty 2

## 2016-02-25 MED ORDER — ACETAMINOPHEN 325 MG PO TABS: 650.0000 mg | ORAL_TABLET | Freq: Four times a day (QID) | ORAL | Status: DC | PRN

## 2016-02-25 MED ORDER — DEXAMETHASONE SODIUM PHOSPHATE 10 MG/ML IJ SOLN
10.0000 mg | Freq: Once | INTRAMUSCULAR | Status: AC
  Administered 2016-02-26: 10 mg via INTRAVENOUS
  Filled 2016-02-25: qty 1

## 2016-02-25 MED ORDER — MEPERIDINE HCL 25 MG/ML IJ SOLN: 6.2500 mg | INTRAMUSCULAR | Status: DC | PRN

## 2016-02-25 MED ORDER — OXYCODONE HCL 5 MG PO TABS
ORAL_TABLET | ORAL | Status: AC
  Filled 2016-02-25: qty 2

## 2016-02-25 MED ORDER — BUPIVACAINE HCL (PF) 0.25 % IJ SOLN
INTRAMUSCULAR | Status: DC | PRN
  Administered 2016-02-25: 10 mL

## 2016-02-25 MED ORDER — CEFAZOLIN SODIUM-DEXTROSE 2-4 GM/100ML-% IV SOLN
2.0000 g | Freq: Four times a day (QID) | INTRAVENOUS | Status: AC
  Administered 2016-02-25 – 2016-02-26 (×2): 2 g via INTRAVENOUS
  Filled 2016-02-25 (×2): qty 100

## 2016-02-25 MED ORDER — LISINOPRIL 10 MG PO TABS
10.0000 mg | ORAL_TABLET | Freq: Every day | ORAL | Status: DC
  Administered 2016-02-25 – 2016-02-26 (×2): 10 mg via ORAL
  Filled 2016-02-25 (×2): qty 1

## 2016-02-25 MED ORDER — RIVAROXABAN 10 MG PO TABS
10.0000 mg | ORAL_TABLET | Freq: Every day | ORAL | Status: DC
  Administered 2016-02-26: 10 mg via ORAL
  Filled 2016-02-25: qty 1

## 2016-02-25 MED ORDER — KETOROLAC TROMETHAMINE 30 MG/ML IJ SOLN
INTRAMUSCULAR | Status: DC | PRN
  Administered 2016-02-25: 30 mg via INTRAVENOUS

## 2016-02-25 MED ORDER — KETOROLAC TROMETHAMINE 30 MG/ML IJ SOLN
INTRAMUSCULAR | Status: AC
  Filled 2016-02-25: qty 2

## 2016-02-25 MED ORDER — BUPIVACAINE-EPINEPHRINE (PF) 0.5% -1:200000 IJ SOLN
INTRAMUSCULAR | Status: DC | PRN
  Administered 2016-02-25: 30 mL via PERINEURAL

## 2016-02-25 MED ORDER — PHENYLEPHRINE HCL 10 MG/ML IJ SOLN
INTRAMUSCULAR | Status: DC | PRN
  Administered 2016-02-25: 80 ug via INTRAVENOUS
  Administered 2016-02-25: 40 ug via INTRAVENOUS
  Administered 2016-02-25: 80 ug via INTRAVENOUS

## 2016-02-25 MED ORDER — PROPOFOL 500 MG/50ML IV EMUL
INTRAVENOUS | Status: AC
  Filled 2016-02-25: qty 100

## 2016-02-25 MED ORDER — SENNA 8.6 MG PO TABS
1.0000 | ORAL_TABLET | Freq: Two times a day (BID) | ORAL | Status: DC
  Administered 2016-02-25 – 2016-02-26 (×2): 8.6 mg via ORAL
  Filled 2016-02-25 (×2): qty 1

## 2016-02-25 MED ORDER — BUPIVACAINE IN DEXTROSE 0.75-8.25 % IT SOLN
INTRATHECAL | Status: DC | PRN
  Administered 2016-02-25: 2 mL via INTRATHECAL

## 2016-02-25 MED ORDER — METHOCARBAMOL 1000 MG/10ML IJ SOLN
500.0000 mg | Freq: Four times a day (QID) | INTRAVENOUS | Status: DC | PRN
  Filled 2016-02-25: qty 5

## 2016-02-25 MED ORDER — PHENOL 1.4 % MT LIQD: 1.0000 | OROMUCOSAL | Status: DC | PRN

## 2016-02-25 MED ORDER — ONDANSETRON HCL 4 MG PO TABS: 4.0000 mg | ORAL_TABLET | Freq: Three times a day (TID) | ORAL | 0 refills | Status: DC | PRN

## 2016-02-25 MED ORDER — HYDROMORPHONE HCL 1 MG/ML IJ SOLN
0.2500 mg | INTRAMUSCULAR | Status: DC | PRN
  Administered 2016-02-25 (×2): 0.5 mg via INTRAVENOUS

## 2016-02-25 MED ORDER — SODIUM CHLORIDE 0.9 % IR SOLN
Status: DC | PRN
  Administered 2016-02-25: 3000 mL

## 2016-02-25 MED ORDER — METFORMIN HCL 500 MG PO TABS
500.0000 mg | ORAL_TABLET | Freq: Two times a day (BID) | ORAL | Status: DC
  Administered 2016-02-25 – 2016-02-26 (×3): 500 mg via ORAL
  Filled 2016-02-25 (×3): qty 1

## 2016-02-25 MED ORDER — OXYCODONE HCL 5 MG PO TABS
5.0000 mg | ORAL_TABLET | ORAL | Status: DC | PRN
  Administered 2016-02-25 – 2016-02-26 (×3): 10 mg via ORAL
  Filled 2016-02-25 (×2): qty 2

## 2016-02-25 MED ORDER — ACETAMINOPHEN 500 MG PO TABS
1000.0000 mg | ORAL_TABLET | Freq: Once | ORAL | Status: AC
  Administered 2016-02-25: 1000 mg via ORAL

## 2016-02-25 MED ORDER — DOCUSATE SODIUM 100 MG PO CAPS: 100.0000 mg | ORAL_CAPSULE | Freq: Two times a day (BID) | ORAL | 0 refills | Status: DC

## 2016-02-25 MED ORDER — METHOCARBAMOL 500 MG PO TABS
500.0000 mg | ORAL_TABLET | Freq: Four times a day (QID) | ORAL | Status: DC | PRN
  Administered 2016-02-25: 500 mg via ORAL

## 2016-02-25 MED ORDER — ACETAMINOPHEN 325 MG PO TABS
650.0000 mg | ORAL_TABLET | Freq: Four times a day (QID) | ORAL | Status: AC
  Administered 2016-02-25 – 2016-02-26 (×4): 650 mg via ORAL
  Filled 2016-02-25 (×4): qty 2

## 2016-02-25 MED ORDER — PHENYLEPHRINE HCL 10 MG/ML IJ SOLN
INTRAVENOUS | Status: DC | PRN
  Administered 2016-02-25: 50 ug/min via INTRAVENOUS

## 2016-02-25 MED ORDER — PROMETHAZINE HCL 25 MG/ML IJ SOLN: 6.2500 mg | INTRAMUSCULAR | Status: DC | PRN

## 2016-02-25 MED ORDER — PIOGLITAZONE HCL 15 MG PO TABS
15.0000 mg | ORAL_TABLET | Freq: Every day | ORAL | Status: DC
  Administered 2016-02-25 – 2016-02-26 (×2): 15 mg via ORAL
  Filled 2016-02-25 (×2): qty 1

## 2016-02-25 MED ORDER — PANTOPRAZOLE SODIUM 40 MG PO TBEC
40.0000 mg | DELAYED_RELEASE_TABLET | Freq: Every day | ORAL | Status: DC
  Administered 2016-02-26: 40 mg via ORAL
  Filled 2016-02-25: qty 1

## 2016-02-25 MED ORDER — ACETAMINOPHEN 650 MG RE SUPP: 650.0000 mg | Freq: Four times a day (QID) | RECTAL | Status: DC | PRN

## 2016-02-25 MED ORDER — SORBITOL 70 % SOLN: 30.0000 mL | Freq: Every day | Status: DC | PRN

## 2016-02-25 MED ORDER — GLYCOPYRROLATE 0.2 MG/ML IV SOSY
PREFILLED_SYRINGE | INTRAVENOUS | Status: AC
  Filled 2016-02-25: qty 3

## 2016-02-25 MED ORDER — PROPOFOL 500 MG/50ML IV EMUL
INTRAVENOUS | Status: DC | PRN
  Administered 2016-02-25: 50 ug/kg/min via INTRAVENOUS

## 2016-02-25 MED ORDER — ONDANSETRON HCL 4 MG/2ML IJ SOLN
INTRAMUSCULAR | Status: DC | PRN
  Administered 2016-02-25: 4 mg via INTRAVENOUS

## 2016-02-25 MED ORDER — ATORVASTATIN CALCIUM 40 MG PO TABS
40.0000 mg | ORAL_TABLET | Freq: Every day | ORAL | Status: DC
  Administered 2016-02-25 – 2016-02-26 (×2): 40 mg via ORAL
  Filled 2016-02-25 (×2): qty 1

## 2016-02-25 MED ORDER — KETOROLAC TROMETHAMINE 15 MG/ML IJ SOLN
7.5000 mg | Freq: Four times a day (QID) | INTRAMUSCULAR | Status: AC
  Administered 2016-02-26 (×3): 7.5 mg via INTRAVENOUS
  Filled 2016-02-25 (×4): qty 1

## 2016-02-25 MED ORDER — BUPIVACAINE HCL (PF) 0.25 % IJ SOLN
INTRAMUSCULAR | Status: AC
  Filled 2016-02-25: qty 30

## 2016-02-25 MED ORDER — MORPHINE SULFATE (PF) 2 MG/ML IV SOLN: 2.0000 mg | INTRAVENOUS | Status: DC | PRN

## 2016-02-25 MED ORDER — ONDANSETRON HCL 4 MG PO TABS: 4.0000 mg | ORAL_TABLET | Freq: Four times a day (QID) | ORAL | Status: DC | PRN

## 2016-02-25 MED ORDER — LACTATED RINGERS IV SOLN
INTRAVENOUS | Status: DC
  Administered 2016-02-25 (×3): via INTRAVENOUS

## 2016-02-25 MED ORDER — ONDANSETRON HCL 4 MG/2ML IJ SOLN: 4.0000 mg | Freq: Four times a day (QID) | INTRAMUSCULAR | Status: DC | PRN

## 2016-02-25 MED ORDER — METHOCARBAMOL 500 MG PO TABS
ORAL_TABLET | ORAL | Status: AC
  Filled 2016-02-25: qty 1

## 2016-02-25 SURGICAL SUPPLY — 60 items
APL SKNCLS STERI-STRIP NONHPOA (GAUZE/BANDAGES/DRESSINGS) ×1
BANDAGE ESMARK 6X9 LF (GAUZE/BANDAGES/DRESSINGS) ×1 IMPLANT
BENZOIN TINCTURE PRP APPL 2/3 (GAUZE/BANDAGES/DRESSINGS) ×1 IMPLANT
BLADE SAG 18X100X1.27 (BLADE) ×2 IMPLANT
BNDG CMPR 9X6 STRL LF SNTH (GAUZE/BANDAGES/DRESSINGS) ×1
BNDG COHESIVE 6X5 TAN STRL LF (GAUZE/BANDAGES/DRESSINGS) ×2 IMPLANT
BNDG ESMARK 6X9 LF (GAUZE/BANDAGES/DRESSINGS) ×2
BOWL SMART MIX CTS (DISPOSABLE) ×2 IMPLANT
CAPT KNEE TOTAL 3 ×1 IMPLANT
CEMENT BONE SIMPLEX SPEEDSET (Cement) ×4 IMPLANT
CLSR STERI-STRIP ANTIMIC 1/2X4 (GAUZE/BANDAGES/DRESSINGS) ×3 IMPLANT
COVER SURGICAL LIGHT HANDLE (MISCELLANEOUS) ×2 IMPLANT
CUFF TOURNIQUET SINGLE 34IN LL (TOURNIQUET CUFF) ×2 IMPLANT
DRAPE EXTREMITY T 121X128X90 (DRAPE) ×2 IMPLANT
DRAPE U-SHAPE 47X51 STRL (DRAPES) ×2 IMPLANT
DRSG ADAPTIC 3X8 NADH LF (GAUZE/BANDAGES/DRESSINGS) ×2 IMPLANT
DRSG MEPILEX BORDER 4X8 (GAUZE/BANDAGES/DRESSINGS) ×2 IMPLANT
DURAPREP 26ML APPLICATOR (WOUND CARE) ×4 IMPLANT
ELECT CAUTERY BLADE 6.4 (BLADE) ×2 IMPLANT
ELECT REM PT RETURN 9FT ADLT (ELECTROSURGICAL) ×2
ELECTRODE REM PT RTRN 9FT ADLT (ELECTROSURGICAL) ×1 IMPLANT
FACESHIELD WRAPAROUND (MASK) ×4 IMPLANT
FACESHIELD WRAPAROUND OR TEAM (MASK) ×2 IMPLANT
GAUZE SPONGE 4X4 12PLY STRL (GAUZE/BANDAGES/DRESSINGS) ×2 IMPLANT
GLOVE BIO SURGEON STRL SZ7.5 (GLOVE) ×4 IMPLANT
GLOVE BIOGEL PI IND STRL 8 (GLOVE) ×2 IMPLANT
GLOVE BIOGEL PI INDICATOR 8 (GLOVE) ×2
GOWN STRL REUS W/ TWL LRG LVL3 (GOWN DISPOSABLE) ×2 IMPLANT
GOWN STRL REUS W/ TWL XL LVL3 (GOWN DISPOSABLE) ×1 IMPLANT
GOWN STRL REUS W/TWL LRG LVL3 (GOWN DISPOSABLE) ×4
GOWN STRL REUS W/TWL XL LVL3 (GOWN DISPOSABLE) ×2
HANDPIECE INTERPULSE COAX TIP (DISPOSABLE) ×2
IMMOBILIZER KNEE 22 UNIV (SOFTGOODS) ×2 IMPLANT
IMMOBILIZER KNEE 24 THIGH 36 (MISCELLANEOUS) IMPLANT
IMMOBILIZER KNEE 24 UNIV (MISCELLANEOUS)
KIT BASIN OR (CUSTOM PROCEDURE TRAY) ×2 IMPLANT
KIT ROOM TURNOVER OR (KITS) ×2 IMPLANT
LIQUID BAND (GAUZE/BANDAGES/DRESSINGS) IMPLANT
MANIFOLD NEPTUNE II (INSTRUMENTS) ×2 IMPLANT
NDL 18GX1X1/2 (RX/OR ONLY) (NEEDLE) ×1 IMPLANT
NEEDLE 18GX1X1/2 (RX/OR ONLY) (NEEDLE) ×2 IMPLANT
NS IRRIG 1000ML POUR BTL (IV SOLUTION) ×2 IMPLANT
PACK TOTAL JOINT (CUSTOM PROCEDURE TRAY) ×2 IMPLANT
PACK UNIVERSAL I (CUSTOM PROCEDURE TRAY) ×2 IMPLANT
PAD ARMBOARD 7.5X6 YLW CONV (MISCELLANEOUS) ×2 IMPLANT
SET HNDPC FAN SPRY TIP SCT (DISPOSABLE) IMPLANT
STAPLER VISISTAT 35W (STAPLE) IMPLANT
SUCTION FRAZIER HANDLE 10FR (MISCELLANEOUS) ×1
SUCTION TUBE FRAZIER 10FR DISP (MISCELLANEOUS) ×1 IMPLANT
SUT MNCRL AB 4-0 PS2 18 (SUTURE) ×2 IMPLANT
SUT MON AB 2-0 CT1 27 (SUTURE) ×4 IMPLANT
SUT MON AB 2-0 CT1 36 (SUTURE) ×1 IMPLANT
SUT VIC AB 0 CT1 27 (SUTURE) ×2
SUT VIC AB 0 CT1 27XBRD ANBCTR (SUTURE) ×1 IMPLANT
SUT VIC AB 1 CTX 36 (SUTURE) ×2
SUT VIC AB 1 CTX36XBRD ANBCTR (SUTURE) ×1 IMPLANT
SYR 50ML LL SCALE MARK (SYRINGE) ×2 IMPLANT
TOWEL OR 17X24 6PK STRL BLUE (TOWEL DISPOSABLE) ×2 IMPLANT
TOWEL OR 17X26 10 PK STRL BLUE (TOWEL DISPOSABLE) ×2 IMPLANT
TRAY FOLEY BAG SILVER LF 14FR (CATHETERS) IMPLANT

## 2016-02-25 NOTE — Interval H&P Note (Signed)
History and Physical Interval Note:  02/25/2016 9:17 AM  Brad Day  has presented today for surgery, with the diagnosis of OA LEFT KNEE  The various methods of treatment have been discussed with the patient and family. After consideration of risks, benefits and other options for treatment, the patient has consented to  Procedure(s): TOTAL KNEE ARTHROPLASTY (Left) as a surgical intervention .  The patient's history has been reviewed, patient examined, no change in status, stable for surgery.  I have reviewed the patient's chart and labs.  Questions were answered to the patient's satisfaction.     Raylynn Hersh D

## 2016-02-25 NOTE — Anesthesia Preprocedure Evaluation (Signed)
Anesthesia Evaluation  Patient identified by MRN, date of birth, ID band Patient awake    Reviewed: Allergy & Precautions, NPO status , Patient's Chart, lab work & pertinent test results  Airway Mallampati: II  TM Distance: >3 FB Neck ROM: Full    Dental no notable dental hx.    Pulmonary shortness of breath, former smoker,    Pulmonary exam normal breath sounds clear to auscultation       Cardiovascular hypertension, + CAD and + Peripheral Vascular Disease  Normal cardiovascular exam Rhythm:Regular Rate:Normal     Neuro/Psych TIACVA negative psych ROS   GI/Hepatic negative GI ROS, Neg liver ROS,   Endo/Other  negative endocrine ROSdiabetes  Renal/GU negative Renal ROS     Musculoskeletal  (+) Arthritis ,   Abdominal (+) + obese,   Peds  Hematology negative hematology ROS (+)   Anesthesia Other Findings   Reproductive/Obstetrics negative OB ROS                             Anesthesia Physical Anesthesia Plan  ASA: III  Anesthesia Plan: Spinal   Post-op Pain Management:    Induction: Intravenous  Airway Management Planned:   Additional Equipment:   Intra-op Plan:   Post-operative Plan:   Informed Consent: I have reviewed the patients History and Physical, chart, labs and discussed the procedure including the risks, benefits and alternatives for the proposed anesthesia with the patient or authorized representative who has indicated his/her understanding and acceptance.   Dental advisory given  Plan Discussed with: CRNA  Anesthesia Plan Comments:         Anesthesia Quick Evaluation

## 2016-02-25 NOTE — Anesthesia Procedure Notes (Signed)
Procedure Name: MAC Date/Time: 02/25/2016 10:42 AM Performed by: Scheryl Darter Pre-anesthesia Checklist: Patient identified, Emergency Drugs available, Suction available, Patient being monitored and Timeout performed Oxygen Delivery Method: Simple face mask Placement Confirmation: positive ETCO2

## 2016-02-25 NOTE — Progress Notes (Signed)
Orthopedic Tech Progress Note Patient Details:  Brad Day January 12, 1941 [id number]  CPM Left Knee CPM Left Knee: On Left Knee Flexion (Degrees): 60 Left Knee Extension (Degrees): 0 Additional Comments: Trapeze bar and foot roll   [name] 02/25/2016, 3:31 PM

## 2016-02-25 NOTE — Transfer of Care (Signed)
Immediate Anesthesia Transfer of Care Note  Patient: Brad Day  Procedure(s) Performed: Procedure(s): TOTAL KNEE ARTHROPLASTY (Left)  Patient Location: PACU  Anesthesia Type:MAC, Regional and Spinal  Level of Consciousness: awake, alert , oriented and sedated  Airway & Oxygen Therapy: Patient Spontanous Breathing and Patient connected to nasal cannula oxygen  Post-op Assessment: Report given to RN, Post -op Vital signs reviewed and stable and Patient moving all extremities  Post vital signs: Reviewed and stable  Last Vitals:  Vitals:   02/25/16 1000 02/25/16 1001  BP:  (!) 137/55  Pulse: 65 65  Resp: 14 13  Temp:      Last Pain:  Vitals:   02/25/16 0831  TempSrc: Oral         Complications: No apparent anesthesia complications

## 2016-02-25 NOTE — Anesthesia Procedure Notes (Signed)
Spinal  Patient location during procedure: OR Staffing Anesthesiologist: Nolon Nations Performed: anesthesiologist  Preanesthetic Checklist Completed: patient identified, site marked, surgical consent, pre-op evaluation, timeout performed, IV checked, risks and benefits discussed and monitors and equipment checked Spinal Block Patient position: sitting Prep: Betadine Patient monitoring: heart rate, continuous pulse ox and blood pressure Approach: left paramedian Location: L3-4 Injection technique: single-shot Needle Needle type: Sprotte  Needle gauge: 24 G Needle length: 9 cm Additional Notes Expiration date of kit checked and confirmed. Patient tolerated procedure well, without complications.

## 2016-02-25 NOTE — Op Note (Signed)
DATE OF SURGERY:  02/25/2016 TIME: 12:42 PM  PATIENT NAME:  Brad Day   AGE: 75 y.o.    PRE-OPERATIVE DIAGNOSIS:  Osteoarthritis  LEFT KNEE  POST-OPERATIVE DIAGNOSIS:  Same  PROCEDURE:  Procedure(s): TOTAL KNEE ARTHROPLASTY   SURGEON:  Idali Lafever D, MD   ASSISTANT:  Roxan Hockey, PA-C, he was present and scrubbed throughout the case, critical for completion in a timely fashion, and for retraction, instrumentation, and closure.    OPERATIVE IMPLANTS: [employer] Triathlon Posterior Stabilized.  Femur size 7, Tibia size 6, Patella size 35 3-peg oval button, with a 9 mm polyethylene insert.   PREOPERATIVE INDICATIONS:  Brad Day is a 75 y.o. year old male with end stage bone on bone degenerative arthritis of the knee who failed conservative treatment, including injections, antiinflammatories, activity modification, and assistive devices, and had significant impairment of their activities of daily living, and elected for Total Knee Arthroplasty.   The risks, benefits, and alternatives were discussed at length including but not limited to the risks of infection, bleeding, nerve injury, stiffness, blood clots, the need for revision surgery, cardiopulmonary complications, among others, and they were willing to proceed.   OPERATIVE DESCRIPTION:  The patient was brought to the operative room and placed in a supine position.  General anesthesia was administered.  IV antibiotics were given.  The lower extremity was prepped and draped in the usual sterile fashion.  Time out was performed.  The leg was elevated and exsanguinated and the tourniquet was inflated.  Anterior approach was performed.  The patella was everted and osteophytes were removed.  The anterior horn of the medial and lateral meniscus was removed.   The distal femur was opened with the drill and the intramedullary distal femoral cutting jig was utilized, set at 5 degrees resecting 10 mm off the distal femur.  Care  was taken to protect the collateral ligaments.  The distal femoral sizing jig was applied, taking care to avoid notching.  Then the 4-in-1 cutting jig was applied and the anterior and posterior femur was cut, along with the chamfer cuts.  All posterior osteophytes were removed.  The flexion gap was then measured and was symmetric with the extension gap.  Then the extramedullary tibial cutting jig was utilized making the appropriate cut using the anterior tibial crest as a reference building in appropriate posterior slope.  Care was taken during the cut to protect the medial and collateral ligaments.  The proximal tibia was removed along with the posterior horns of the menisci.  The PCL was sacrificed.    The extensor gap was measured and was approximately 77mm.    I completed the distal femoral preparation using the appropriate jig to prepare the box.  The patella was then measured, and cut with the saw.    The proximal tibia sized and prepared accordingly with the reamer and the punch, and then all components were trialed with the 19mm poly insert.  The knee was found to have excellent balance and full motion.    The above named components were then cemented into place and all excess cement was removed. Poly tibial piece and patella were inserted.  I was very happy with his stability and ROM  I performed a periarticular injection with marcaine and toradol  The knee was easily taken through a range of motion and the patella tracked well and the knee irrigated copiously and the parapatellar and subcutaneous tissue closed with vicryl, and monocryl with steri strips for the  skin.  The incision was dressed with sterile gauze and the tourniquet released and the patient was awakened and returned to the PACU in stable and satisfactory condition.  There were no complications.  Total tourniquet time was 100 minutes.   POSTOPERATIVE PLAN: post op Abx, DVT px: SCD's, TED's, Early ambulation and chemical  px

## 2016-02-25 NOTE — Anesthesia Procedure Notes (Signed)
Anesthesia Regional Block:  Adductor canal block  Pre-Anesthetic Checklist: ,, timeout performed, Correct Patient, Correct Site, Correct Laterality, Correct Procedure, Correct Position, site marked, Risks and benefits discussed, Surgical consent,  Pre-op evaluation,  Post-op pain management  Laterality: Left  Prep: chloraprep       Needles:  Injection technique: Single-shot  Needle Type: Stimiplex     Needle Length: 9cm 9 cm Needle Gauge: 21 and 21 G    Additional Needles:  Procedures: ultrasound guided (picture in chart) Adductor canal block Narrative:  Injection made incrementally with aspirations every 5 mL.  Performed by: Personally  Anesthesiologist: Nolon Nations  Additional Notes: BP cuff, EKG monitors applied. Sedation begun. Artery and nerve location verified with U/S and anesthetic injected incrementally, slowly, and after negative aspirations under direct u/s guidance. Good fascial /perineural spread. Tolerated well.

## 2016-02-25 NOTE — Anesthesia Postprocedure Evaluation (Signed)
Anesthesia Post Note  Patient: Brad Day  Procedure(s) Performed: Procedure(s) (LRB): TOTAL KNEE ARTHROPLASTY (Left)  Patient location during evaluation: PACU Anesthesia Type: Spinal and Regional Level of consciousness: awake and alert Pain management: pain level controlled Vital Signs Assessment: post-procedure vital signs reviewed and stable Respiratory status: spontaneous breathing and respiratory function stable Cardiovascular status: blood pressure returned to baseline and stable Postop Assessment: spinal receding Anesthetic complications: no    Last Vitals:  Vitals:   02/25/16 1600 02/25/16 1630  BP: 117/75 121/69  Pulse:  (!) 59  Resp: 12 16  Temp:  36.3 C    Last Pain:  Vitals:   02/25/16 1600  TempSrc:   PainSc: [place]

## 2016-02-26 ENCOUNTER — Encounter (HOSPITAL_COMMUNITY): Payer: Self-pay | Admitting: Orthopedic Surgery

## 2016-02-26 LAB — CBC
HCT: 36.6 % — ABNORMAL LOW (ref 39.0–52.0)
Hemoglobin: 11.5 g/dL — ABNORMAL LOW (ref 13.0–17.0)
MCH: 30.1 pg (ref 26.0–34.0)
MCHC: 31.4 g/dL (ref 30.0–36.0)
MCV: 95.8 fL (ref 78.0–100.0)
Platelets: 173 K/uL (ref 150–400)
RBC: 3.82 MIL/uL — ABNORMAL LOW (ref 4.22–5.81)
RDW: 14.4 % (ref 11.5–15.5)
WBC: 10.2 K/uL (ref 4.0–10.5)

## 2016-02-26 NOTE — Evaluation (Signed)
Physical Therapy Evaluation Patient Details Name: Brad Day MRN: [id number] DOB: 10-Dec-1940 Today's Date: 02/26/2016   History of Present Illness  Pt is a 75 y/o male s/p L TKR. PMH including but not limited to CVA., COPD, DM, CAD and CABG x5 in 2001.  Clinical Impression  Pt presented supine in bed with HOB elevated, awake and willing to participate in therapy session. Prior to admission, pt reported being independent with all ADL's and ambulating with occasional use of SPC. Pt is an avid hunter and is looking forward to being able to participate in deer season. Pt moved well during evaluation and did not require any physical assistance during functional mobility. Pt would continue to benefit from skilled physical therapy services at this time while admitted and after d/c to address his below listed limitations in order to improve his overall safety and independence with functional mobility. PT plan to perform stair training at next session.      Follow Up Recommendations Home health PT;Supervision for mobility/OOB    Equipment Recommendations  Other (comment) (pt reported having all necessary equipment at home)    Recommendations for Other Services       Precautions / Restrictions Precautions Precautions: Knee;Fall Precaution Booklet Issued: Yes (comment) Precaution Comments: Positioning of L LE following TKR surgery reviewed with pt. Restrictions Weight Bearing Restrictions: Yes LLE Weight Bearing: Weight bearing as tolerated      Mobility  Bed Mobility Overal bed mobility: Needs Assistance Bed Mobility: Supine to Sit     Supine to sit: Supervision;HOB elevated     General bed mobility comments: pt required increased time  Transfers Overall transfer level: Needs assistance Equipment used: Rolling walker (2 wheeled) Transfers: Sit to/from Stand Sit to Stand: Min guard         General transfer comment: pt required increased time and VC'ing for bilateral hand  positioning  Ambulation/Gait Ambulation/Gait assistance: Min guard Ambulation Distance (Feet): 150 Feet Assistive device: Rolling walker (2 wheeled) Gait Pattern/deviations: Step-through pattern;Decreased step length - right;Decreased stance time - left;Decreased weight shift to left Gait velocity: decreased      Stairs            Wheelchair Mobility    Modified Rankin (Stroke Patients Only)       Balance Overall balance assessment: Needs assistance Sitting-balance support: Feet supported;Bilateral upper extremity supported Sitting balance-[name]: Poor     Standing balance support: During functional activity;Bilateral upper extremity supported Standing balance-[name]: Poor                               Pertinent Vitals/Pain Pain Assessment: No/denies pain    Home Living Family/patient expects to be discharged to:: Private residence Living Arrangements: Spouse/significant other Available Help at Discharge: Family;Available 24 hours/Day Type of Home: House Home Access: Stairs to enter Entrance Stairs-Rails: Can reach both Entrance Stairs-Number of Steps: 3 Home Layout: One level Home Equipment: Walker - 2 wheels;Cane - single point;Bedside commode      Prior Function Level of Independence: Independent with assistive device(s)         Comments: pt occasionally using SPC during ambulation     Hand Dominance        Extremity/Trunk Assessment   Upper Extremity Assessment: Defer to OT evaluation           Lower Extremity Assessment: LLE deficits/detail   LLE Deficits / Details: Pt with decreased strength and ROM limitations secondary  to post-op. Sensation is grossly intact.     Communication   Communication: No difficulties  Cognition Arousal/Alertness: Awake/alert Behavior During Therapy: WFL for tasks assessed/performed Overall Cognitive Status: Within Functional Limits for tasks assessed                       General Comments      Exercises Total Joint Exercises Ankle Circles/Pumps: AROM;Strengthening;Both;10 reps;Seated Quad Sets: AROM;Strengthening;Left;10 reps;Seated Long Arc Quad: AROM;Strengthening;Left;5 reps;Seated Knee Flexion: AROM;Strengthening;Left;5 reps;Seated      Assessment/Plan    PT Assessment Patient needs continued PT services  PT Diagnosis Difficulty walking   PT Problem List Decreased strength;Decreased range of motion;Decreased activity tolerance;Decreased balance;Decreased mobility;Decreased coordination;Decreased knowledge of use of DME;Pain  PT Treatment Interventions Stair training;DME instruction;Gait training;Functional mobility training;Therapeutic activities;Therapeutic exercise;Balance training;Neuromuscular re-education;Patient/family education   PT Goals (Current goals can be found in the Care Plan section) Acute Rehab PT Goals Patient Stated Goal: return home and be ready for deer season PT Goal Formulation: With patient Time For Goal Achievement: 03/04/16 Potential to Achieve Goals: Good    Frequency 7X/week   Barriers to discharge        Co-evaluation               End of Session Equipment Utilized During Treatment: Gait belt Activity Tolerance: Patient limited by fatigue Patient left: in chair;with call bell/phone within reach Nurse Communication: Mobility status         Time: 0825-0851 PT Time Calculation (min) (ACUTE ONLY): 26 min   Charges:   PT Evaluation $PT Eval Moderate Complexity: 1 Procedure PT Treatments $Gait Training: 8-22 mins   PT G CodesClearnce Sorrel Maicie Day 02/26/2016, 9:41 AM Sherie Don, PT, DPT [phone]

## 2016-02-26 NOTE — Discharge Instructions (Signed)

## 2016-02-26 NOTE — Progress Notes (Signed)
Orthopedic Tech Progress Note Patient Details:  [name] 10/12/1940 [id number]  Patient ID: Brad Day, male   DOB: 1941/04/18, 75 y.o.   MRN: [id number]   [name] 02/26/2016, 2:29 PM Placed pt's lle on cpm @0 -60 degrees @1430 

## 2016-02-26 NOTE — Progress Notes (Addendum)
   Assessment: 1 Day Post-Op  S/P Procedure(s) (LRB): TOTAL KNEE ARTHROPLASTY (Left) by Dr. Ernesta Amble. Percell Miller on 02/25/16  Principal Problem:   Primary osteoarthritis of left knee Active Problems:   Bilateral carotid artery disease (HCC)   History of CVA (cerebrovascular accident) without residual deficits   History of coronary artery bypass, five Acute Blood loss anemia - likely with dilutional component.    Plan: Advance diet Up with therapy  Weight Bearing: Weight Bearing as Tolerated (WBAT) left leg Dressings: prn.  VTE prophylaxis: Xarelto, SCDs, ambulation Dispo: Home  W/ HHPT later today pending PT Evaluation.  Subjective: Patient reports pain as mild. Pain controlled with PO meds.  Tolerating liquids.  Appetite okay.  Urinating.  +Flatus.  No CP, SOB.  Has been Up standing at edge of bed.  Objective:   VITALS:   Vitals:   02/25/16 1600 02/25/16 1630 02/25/16 2135 02/26/16 0500  BP: 117/75 121/69 118/60 (!) 119/51  Pulse:  (!) 59 68 66  Resp: 12 16 18 20   Temp:  97.3 F (36.3 C) 97.6 F (36.4 C) 98.1 F (36.7 C)  TempSrc:   Oral Oral  SpO2:  99% 100% 93%  Weight:      Height:        General: NAD.  Upright in bed.  Talkative. Resp: clear to auscultation bilaterally Cardio: regular rate and rhythm ABD protuberant, soft Neurologically intact MSK Neurovascularly intact Sensation intact distally Intact pulses distally Dorsiflexion/Plantar flexion intact Incision: dressing C/D/I   Brad Day 02/26/2016, 7:07 AM

## 2016-02-26 NOTE — Progress Notes (Signed)
Physical Therapy Treatment Patient Details Name: Brad Day MRN: [id number] DOB: 05-27-1941 Today's Date: 02/26/2016    History of Present Illness Pt is a 75 y/o male s/p L TKR. PMH including but not limited to CVA., COPD, DM, CAD and CABG x5 in 2001.    PT Comments    Pt presented sitting OOB in recliner when PT entered room. Pt requested to return to bed at end of session. Pt's wife and grandchild were present throughout session. Pt successfully completed stair training during session. Pt would continue to benefit from skilled physical therapy services at this time while admitted and after d/c to address his limitations in order to improve his overall safety and independence with functional mobility.    Follow Up Recommendations  Home health PT;Supervision for mobility/OOB     Equipment Recommendations  Other (comment) (pt reported having all necessary DME at home)    Recommendations for Other Services       Precautions / Restrictions Precautions Precautions: Knee;Fall Precaution Booklet Issued: Yes (comment) Precaution Comments: Positioning of L LE following TKR surgery reviewed with pt. Restrictions Weight Bearing Restrictions: Yes LLE Weight Bearing: Weight bearing as tolerated    Mobility  Bed Mobility Overal bed mobility: Needs Assistance Bed Mobility: Sit to Supine     Supine to sit: Supervision;HOB elevated Sit to supine: Supervision   General bed mobility comments: pt required increased time  Transfers Overall transfer level: Needs assistance Equipment used: Rolling walker (2 wheeled) Transfers: Sit to/from Stand Sit to Stand: Min guard         General transfer comment: pt required increased time and VC'ing for bilateral hand positioning  Ambulation/Gait Ambulation/Gait assistance: Min guard Ambulation Distance (Feet): 150 Feet (150 ft x2 with stair training in between) Assistive device: Rolling walker (2 wheeled) Gait Pattern/deviations:  Step-through pattern;Decreased step length - right;Decreased stance time - left;Decreased weight shift to left Gait velocity: decreased       Stairs Stairs: Yes Stairs assistance: Min guard Stair Management: One rail Right;Step to pattern;Forwards Number of Stairs: 2 General stair comments: pt ascended with R LE leading and descended with L LE leading  Wheelchair Mobility    Modified Rankin (Stroke Patients Only)       Balance Overall balance assessment: Needs assistance Sitting-balance support: Feet supported;No upper extremity supported Sitting balance-[name]: Fair     Standing balance support: During functional activity;Bilateral upper extremity supported Standing balance-[name]: Poor                      Cognition Arousal/Alertness: Awake/alert Behavior During Therapy: WFL for tasks assessed/performed Overall Cognitive Status: Within Functional Limits for tasks assessed                      Exercises Total Joint Exercises Ankle Circles/Pumps: AROM;Strengthening;Both;10 reps;Seated Quad Sets: AROM;Strengthening;Left;10 reps;Seated Long Arc Quad: AROM;Strengthening;Left;5 reps;Seated Knee Flexion: AROM;Strengthening;Left;5 reps;Seated    General Comments        Pertinent Vitals/Pain Pain Assessment: Faces Faces Pain Scale: Hurts a little bit Pain Location: L knee Pain Descriptors / Indicators: Grimacing;Guarding Pain Intervention(s): Monitored during session;Repositioned    Home Living Family/patient expects to be discharged to:: Private residence Living Arrangements: Spouse/significant other Available Help at Discharge: Family;Available 24 hours/day Type of Home: House Home Access: Stairs to enter Entrance Stairs-Rails: Can reach both Home Layout: One level Home Equipment: Walker - 2 wheels;Cane - single point;Bedside commode      Prior Function Level of  Independence: Independent with assistive device(s)      Comments:  pt occasionally using SPC during ambulation   PT Goals (current goals can now be found in the care plan section) Acute Rehab PT Goals Patient Stated Goal: return home and be ready for deer season PT Goal Formulation: With patient Time For Goal Achievement: 03/04/16 Potential to Achieve Goals: Good Progress towards PT goals: Progressing toward goals    Frequency  7X/week    PT Plan Current plan remains appropriate    Co-evaluation             End of Session Equipment Utilized During Treatment: Gait belt Activity Tolerance: Patient limited by fatigue Patient left: in bed;with call bell/phone within reach;with family/visitor present     Time: 1210-1222 PT Time Calculation (min) (ACUTE ONLY): 12 min  Charges:  $Gait Training: 8-22 mins                    G CodesClearnce Sorrel Johney Perotti March 13, 2016, 12:52 PM Sherie Don, [place], DPT [phone]

## 2016-02-26 NOTE — Discharge Summary (Signed)
Discharge Summary  Patient ID: Brad Day MRN: [id number] DOB/AGE: March 02, 1941 75 y.o.  Admit date: 02/25/2016 Discharge date: 02/26/2016  Admission Diagnoses:  Primary osteoarthritis of left knee  Discharge Diagnoses:  Principal Problem:   Primary osteoarthritis of left knee Active Problems:   Bilateral carotid artery disease (HCC)   History of CVA (cerebrovascular accident) without residual deficits   History of coronary artery bypass, five   Past Medical History:  Diagnosis Date  . Arthritis   . Carotid artery occlusion   . Coronary artery disease   . Diabetes mellitus without complication ([place])   . Fall down embankment Oct. 26, 2016   Hurt Right shoulder  . History of bleeding ulcers   . Hypertension   . Shortness of breath dyspnea    with exertion  . Stroke [facility])    4 mini strokes    Surgeries: Procedure(s): TOTAL KNEE ARTHROPLASTY on 02/25/2016   Consultants (if any):   Discharged Condition: Improved  Hospital Course: Brad Day is an 75 y.o. male who was admitted 02/25/2016 with a diagnosis of Primary osteoarthritis of left knee and went to the operating room on 02/25/2016 and underwent the above named procedures.    He was given perioperative antibiotics:  Anti-infectives    Start     Dose/Rate Route Frequency Ordered Stop   02/25/16 1700  ceFAZolin (ANCEF) IVPB 2g/100 mL premix     2 g 200 mL/hr over 30 Minutes Intravenous Every 6 hours 02/25/16 1621 02/26/16 0100   02/25/16 0835  ceFAZolin (ANCEF) 2-4 GM/100ML-% IVPB  Status:  Discontinued    Comments:  Gallman, Kathie   : cabinet override      02/25/16 0835 02/25/16 1017   02/25/16 0600  ceFAZolin (ANCEF) 3 g in dextrose 5 % 50 mL IVPB     3 g 130 mL/hr over 30 Minutes Intravenous On call to O.R. 02/24/16 1306 02/25/16 1053    .  He was given sequential compression devices, early ambulation, and Xarelto for DVT prophylaxis.  He benefited maximally from the hospital stay and there were no  complications.    Recent vital signs:  Vitals:   02/25/16 2135 02/26/16 0500  BP: 118/60 (!) 119/51  Pulse: 68 66  Resp: 18 20  Temp: 97.6 F (36.4 C) 98.1 F (36.7 C)    Discharge Medications:     Medication List    TAKE these medications   atorvastatin 40 MG tablet Commonly known as:  LIPITOR Take 40 mg by mouth daily at 6 PM.   docusate sodium 100 MG capsule Commonly known as:  COLACE Take 1 capsule (100 mg total) by mouth 2 (two) times daily. To prevent constipation while taking pain medication.   fish oil-omega-3 fatty acids 1000 MG capsule Take 1 g by mouth 2 (two) times daily.   lisinopril 10 MG tablet Commonly known as:  PRINIVIL,ZESTRIL Take 10 mg by mouth daily.   metFORMIN 500 MG tablet Commonly known as:  GLUCOPHAGE Take 500 mg by mouth 2 (two) times daily with a meal.   methocarbamol 500 MG tablet Commonly known as:  ROBAXIN Take 1 tablet (500 mg total) by mouth every 6 (six) hours as needed for muscle spasms.   metoprolol 50 MG tablet Commonly known as:  LOPRESSOR Take 50 mg by mouth 2 (two) times daily.   niacin 500 MG tablet Take 500 mg by mouth daily with breakfast.   ondansetron 4 MG tablet Commonly known as:  ZOFRAN Take 1 tablet (  4 mg total) by mouth every 8 (eight) hours as needed for nausea or vomiting.   oxyCODONE-acetaminophen 5-325 MG tablet Commonly known as:  PERCOCET/ROXICET Take 1 tablet by mouth every 4 (four) hours as needed for severe pain. What changed:  Another medication with the same name was added. Make sure you understand how and when to take each.   oxyCODONE-acetaminophen 5-325 MG tablet Commonly known as:  ROXICET Take 1-2 tablets by mouth every 4 (four) hours as needed for severe pain. What changed:  You were already taking a medication with the same name, and this prescription was added. Make sure you understand how and when to take each.   pantoprazole 40 MG tablet Commonly known as:  PROTONIX Take 40 mg by  mouth daily.   pioglitazone 15 MG tablet Commonly known as:  ACTOS Take 15 mg by mouth daily.   rivaroxaban 10 MG Tabs tablet Commonly known as:  XARELTO Take 1 tablet (10 mg total) by mouth daily.       Diagnostic Studies: Dg Knee 1-2 Views Left  Result Date: 02/25/2016 CLINICAL DATA:  Status post total left knee arthroplasty. EXAM: LEFT KNEE - 1-2 VIEW COMPARISON:  None. FINDINGS: Has been in 3 component total left knee arthroplasty with anatomic alignment of the orthopedic hardware and no evidence of fracture. Expected postsurgical soft tissue edema and emphysema are seen. IMPRESSION: Post 3 component total left knee arthroplasty without evidence of immediate complications. Electronically Signed   By: Fidela Salisbury M.D.   On: 02/25/2016 15:12    Disposition: 01-Home or Self Care w/ HHPT    Follow-up Information    MURPHY, TIMOTHY D, MD Follow up in 2 week(s).   Specialty:  Orthopedic Surgery Contact information: [address] [place] [address] [phone]           Signed: Prudencio Burly III PA-C 02/26/2016, 7:33 AM

## 2016-02-26 NOTE — Progress Notes (Signed)
Pt ready for discharge. Education/instructions reviewed with pt and wife, and all questions/concerns addressed. IV removed and belongings gathered. Pt will be transported out via wheelchair to wife's car. Will continue to monitor

## 2016-02-26 NOTE — Evaluation (Signed)
Occupational Therapy Evaluation and Discharge Patient Details Name: Brad Day MRN: [id number] DOB: [birth year] Today's Date: 02/26/2016    History of Present Illness Pt is a 75 y/o male s/p L TKR. PMH including but not limited to CVA., COPD, DM, CAD and CABG x5 in 2001.   Clinical Impression   Pt and wife educated in compensatory strategies for LB bathing and dressing. Not interested in adaptive equipment, wife will assist as needed. Instructed in toilet and shower transfers, multiple uses of 3 in 1, safe footwear and transporting items safely using RW. Verbalized understanding of all information. No further OT needs. Pt eager to go home.    Follow Up Recommendations  No OT follow up    Equipment Recommendations  None recommended by OT    Recommendations for Other Services       Precautions / Restrictions Precautions Precautions: Knee;Fall Precaution Booklet Issued: Yes (comment) Precaution Comments: reminded of no pillow under knee and encouraged bone foam Restrictions Weight Bearing Restrictions: Yes LLE Weight Bearing: Weight bearing as tolerated      Mobility Bed Mobility Overal bed mobility: Needs Assistance Bed Mobility: Supine to Sit;Sit to Supine     Supine to sit: Supervision;HOB elevated Sit to supine: Supervision   General bed mobility comments: pt required increased time, no physical assist  Transfers Overall transfer level: Needs assistance Equipment used: Rolling walker (2 wheeled) Transfers: Sit to/from Stand Sit to Stand: Supervision         General transfer comment: cues for technique    Balance Overall balance assessment: Needs assistance Sitting-balance support: Feet supported;No upper extremity supported Sitting balance-[name]: Good     Standing balance support: During functional activity;Bilateral upper extremity supported Standing balance-[name]: Poor                              ADL Overall ADL's : Needs  assistance/impaired Eating/Feeding: Independent;Sitting   Grooming: Wash/dry hands;Standing;Supervision/safety   Upper Body Bathing: Set up;Sitting   Lower Body Bathing: Minimal assistance;Sit to/from stand Lower Body Bathing Details (indicate cue type and reason): recommended long handled bath sponge Upper Body Dressing : Set up;Sitting   Lower Body Dressing: Minimal assistance;Sit to/from stand Lower Body Dressing Details (indicate cue type and reason): wife will assist, instructed to dress L LE first and then R and in safe footwear Toilet Transfer: Supervision/safety;RW;Ambulation Toilet Transfer Details (indicate cue type and reason): stood to urinate       Tub/Shower Transfer Details (indicate cue type and reason): instructed in technique and walker placement, pt aware he can use BSC as shower seat Functional mobility during ADLs: Min guard;Rolling walker General ADL Comments: Instructed in how to transport items safely with RW.     Vision     Perception     Praxis      Pertinent Vitals/Pain Pain Assessment: Faces Faces Pain Scale: Hurts little more Pain Location: L knee Pain Descriptors / Indicators: Sore Pain Intervention(s): Monitored during session;Premedicated before session;Repositioned     Hand Dominance Right   Extremity/Trunk Assessment Upper Extremity Assessment Upper Extremity Assessment: Overall WFL for tasks assessed   Lower Extremity Assessment Lower Extremity Assessment: Defer to PT evaluation       Communication Communication Communication: No difficulties   Cognition Arousal/Alertness: Awake/alert Behavior During Therapy: WFL for tasks assessed/performed Overall Cognitive Status: Within Functional Limits for tasks assessed  General Comments       Exercises       Shoulder Instructions      Home Living Family/patient expects to be discharged to:: Private residence Living Arrangements: Spouse/significant  other Available Help at Discharge: Family;Available 24 hours/day Type of Home: House Home Access: Stairs to enter [employer] of Steps: 3 Entrance Stairs-Rails: Can reach both Home Layout: One level     Bathroom Shower/Tub: Walk-in shower;Tub/shower unit   Bathroom Toilet: Standard     Home Equipment: Environmental consultant - 2 wheels;Cane - single point;Bedside commode          Prior Functioning/Environment Level of Independence: Independent with assistive device(s)        Comments: pt occasionally using SPC during ambulation    OT Diagnosis: Generalized weakness;Acute pain   OT Problem List:     OT Treatment/Interventions:      OT Goals(Current goals can be found in the care plan section) Acute Rehab OT Goals Patient Stated Goal: return home and be ready for deer season  OT Frequency:     Barriers to D/C:            Co-evaluation              End of Session Equipment Utilized During Treatment: Gait belt;Rolling walker  Activity Tolerance: Patient tolerated treatment well Patient left: in bed;with call bell/phone within reach;with family/visitor present   Time: 1320-1336 OT Time Calculation (min): 16 min Charges:  OT General Charges $OT Visit: 1 Procedure OT Evaluation $OT Eval Low Complexity: 1 Procedure G-Codes:    Malka So 02/26/2016, 1:47 PM  [phone]

## 2016-02-27 DIAGNOSIS — Z471 Aftercare following joint replacement surgery: Secondary | ICD-10-CM | POA: Diagnosis not present

## 2016-02-27 DIAGNOSIS — M1990 Unspecified osteoarthritis, unspecified site: Secondary | ICD-10-CM | POA: Diagnosis not present

## 2016-02-27 DIAGNOSIS — E119 Type 2 diabetes mellitus without complications: Secondary | ICD-10-CM | POA: Diagnosis not present

## 2016-02-27 DIAGNOSIS — I10 Essential (primary) hypertension: Secondary | ICD-10-CM | POA: Diagnosis not present

## 2016-02-27 DIAGNOSIS — I2510 Atherosclerotic heart disease of native coronary artery without angina pectoris: Secondary | ICD-10-CM | POA: Diagnosis not present

## 2016-02-27 DIAGNOSIS — Z96652 Presence of left artificial knee joint: Secondary | ICD-10-CM | POA: Diagnosis not present

## 2016-03-02 DIAGNOSIS — Z96652 Presence of left artificial knee joint: Secondary | ICD-10-CM | POA: Diagnosis not present

## 2016-03-02 DIAGNOSIS — Z471 Aftercare following joint replacement surgery: Secondary | ICD-10-CM | POA: Diagnosis not present

## 2016-03-02 DIAGNOSIS — I10 Essential (primary) hypertension: Secondary | ICD-10-CM | POA: Diagnosis not present

## 2016-03-02 DIAGNOSIS — M1990 Unspecified osteoarthritis, unspecified site: Secondary | ICD-10-CM | POA: Diagnosis not present

## 2016-03-02 DIAGNOSIS — I2510 Atherosclerotic heart disease of native coronary artery without angina pectoris: Secondary | ICD-10-CM | POA: Diagnosis not present

## 2016-03-02 DIAGNOSIS — E119 Type 2 diabetes mellitus without complications: Secondary | ICD-10-CM | POA: Diagnosis not present

## 2016-03-04 DIAGNOSIS — Z471 Aftercare following joint replacement surgery: Secondary | ICD-10-CM | POA: Diagnosis not present

## 2016-03-04 DIAGNOSIS — M1990 Unspecified osteoarthritis, unspecified site: Secondary | ICD-10-CM | POA: Diagnosis not present

## 2016-03-04 DIAGNOSIS — E119 Type 2 diabetes mellitus without complications: Secondary | ICD-10-CM | POA: Diagnosis not present

## 2016-03-04 DIAGNOSIS — I10 Essential (primary) hypertension: Secondary | ICD-10-CM | POA: Diagnosis not present

## 2016-03-04 DIAGNOSIS — I2510 Atherosclerotic heart disease of native coronary artery without angina pectoris: Secondary | ICD-10-CM | POA: Diagnosis not present

## 2016-03-04 DIAGNOSIS — Z96652 Presence of left artificial knee joint: Secondary | ICD-10-CM | POA: Diagnosis not present

## 2016-03-06 DIAGNOSIS — E119 Type 2 diabetes mellitus without complications: Secondary | ICD-10-CM | POA: Diagnosis not present

## 2016-03-06 DIAGNOSIS — Z471 Aftercare following joint replacement surgery: Secondary | ICD-10-CM | POA: Diagnosis not present

## 2016-03-06 DIAGNOSIS — Z96652 Presence of left artificial knee joint: Secondary | ICD-10-CM | POA: Diagnosis not present

## 2016-03-06 DIAGNOSIS — I10 Essential (primary) hypertension: Secondary | ICD-10-CM | POA: Diagnosis not present

## 2016-03-06 DIAGNOSIS — M1990 Unspecified osteoarthritis, unspecified site: Secondary | ICD-10-CM | POA: Diagnosis not present

## 2016-03-06 DIAGNOSIS — I2510 Atherosclerotic heart disease of native coronary artery without angina pectoris: Secondary | ICD-10-CM | POA: Diagnosis not present

## 2016-03-08 DIAGNOSIS — M1990 Unspecified osteoarthritis, unspecified site: Secondary | ICD-10-CM | POA: Diagnosis not present

## 2016-03-08 DIAGNOSIS — I10 Essential (primary) hypertension: Secondary | ICD-10-CM | POA: Diagnosis not present

## 2016-03-08 DIAGNOSIS — E119 Type 2 diabetes mellitus without complications: Secondary | ICD-10-CM | POA: Diagnosis not present

## 2016-03-08 DIAGNOSIS — Z96652 Presence of left artificial knee joint: Secondary | ICD-10-CM | POA: Diagnosis not present

## 2016-03-08 DIAGNOSIS — I2510 Atherosclerotic heart disease of native coronary artery without angina pectoris: Secondary | ICD-10-CM | POA: Diagnosis not present

## 2016-03-08 DIAGNOSIS — Z471 Aftercare following joint replacement surgery: Secondary | ICD-10-CM | POA: Diagnosis not present

## 2016-03-10 DIAGNOSIS — Z96652 Presence of left artificial knee joint: Secondary | ICD-10-CM | POA: Diagnosis not present

## 2016-03-10 DIAGNOSIS — Z471 Aftercare following joint replacement surgery: Secondary | ICD-10-CM | POA: Diagnosis not present

## 2016-03-10 DIAGNOSIS — I10 Essential (primary) hypertension: Secondary | ICD-10-CM | POA: Diagnosis not present

## 2016-03-10 DIAGNOSIS — I2510 Atherosclerotic heart disease of native coronary artery without angina pectoris: Secondary | ICD-10-CM | POA: Diagnosis not present

## 2016-03-10 DIAGNOSIS — M1990 Unspecified osteoarthritis, unspecified site: Secondary | ICD-10-CM | POA: Diagnosis not present

## 2016-03-10 DIAGNOSIS — E119 Type 2 diabetes mellitus without complications: Secondary | ICD-10-CM | POA: Diagnosis not present

## 2016-03-11 DIAGNOSIS — M25562 Pain in left knee: Secondary | ICD-10-CM | POA: Diagnosis not present

## 2016-03-11 DIAGNOSIS — M6281 Muscle weakness (generalized): Secondary | ICD-10-CM | POA: Diagnosis not present

## 2016-03-11 DIAGNOSIS — M25662 Stiffness of left knee, not elsewhere classified: Secondary | ICD-10-CM | POA: Diagnosis not present

## 2016-03-11 DIAGNOSIS — M25462 Effusion, left knee: Secondary | ICD-10-CM | POA: Diagnosis not present

## 2016-03-11 DIAGNOSIS — M1712 Unilateral primary osteoarthritis, left knee: Secondary | ICD-10-CM | POA: Diagnosis not present

## 2016-03-17 DIAGNOSIS — M25462 Effusion, left knee: Secondary | ICD-10-CM | POA: Diagnosis not present

## 2016-03-17 DIAGNOSIS — M6281 Muscle weakness (generalized): Secondary | ICD-10-CM | POA: Diagnosis not present

## 2016-03-17 DIAGNOSIS — M25662 Stiffness of left knee, not elsewhere classified: Secondary | ICD-10-CM | POA: Diagnosis not present

## 2016-03-17 DIAGNOSIS — M25562 Pain in left knee: Secondary | ICD-10-CM | POA: Diagnosis not present

## 2016-03-19 DIAGNOSIS — M25562 Pain in left knee: Secondary | ICD-10-CM | POA: Diagnosis not present

## 2016-03-19 DIAGNOSIS — M25662 Stiffness of left knee, not elsewhere classified: Secondary | ICD-10-CM | POA: Diagnosis not present

## 2016-03-22 ENCOUNTER — Inpatient Hospital Stay (HOSPITAL_COMMUNITY)
Admission: EM | Admit: 2016-03-22 | Discharge: 2016-03-24 | DRG: 552 | Disposition: A | Discharge status: Home / Self Care | Payer: Medicare Other | Attending: Internal Medicine | Admitting: Internal Medicine

## 2016-03-22 ENCOUNTER — Encounter (HOSPITAL_COMMUNITY): Payer: Self-pay

## 2016-03-22 ENCOUNTER — Observation Stay (HOSPITAL_COMMUNITY): Payer: Medicare Other

## 2016-03-22 ENCOUNTER — Emergency Department (HOSPITAL_COMMUNITY): Payer: Medicare Other

## 2016-03-22 DIAGNOSIS — N183 Chronic kidney disease, stage 3 unspecified: Secondary | ICD-10-CM | POA: Diagnosis present

## 2016-03-22 DIAGNOSIS — I779 Disorder of arteries and arterioles, unspecified: Secondary | ICD-10-CM | POA: Diagnosis not present

## 2016-03-22 DIAGNOSIS — R519 Headache, unspecified: Secondary | ICD-10-CM | POA: Diagnosis present

## 2016-03-22 DIAGNOSIS — M542 Cervicalgia: Secondary | ICD-10-CM | POA: Diagnosis not present

## 2016-03-22 DIAGNOSIS — E785 Hyperlipidemia, unspecified: Secondary | ICD-10-CM | POA: Diagnosis present

## 2016-03-22 DIAGNOSIS — Z8673 Personal history of transient ischemic attack (TIA), and cerebral infarction without residual deficits: Secondary | ICD-10-CM

## 2016-03-22 DIAGNOSIS — Z951 Presence of aortocoronary bypass graft: Secondary | ICD-10-CM

## 2016-03-22 DIAGNOSIS — Z820 Family history of epilepsy and other diseases of the nervous system: Secondary | ICD-10-CM

## 2016-03-22 DIAGNOSIS — E119 Type 2 diabetes mellitus without complications: Secondary | ICD-10-CM

## 2016-03-22 DIAGNOSIS — R51 Headache: Secondary | ICD-10-CM

## 2016-03-22 DIAGNOSIS — I10 Essential (primary) hypertension: Secondary | ICD-10-CM | POA: Diagnosis present

## 2016-03-22 DIAGNOSIS — I129 Hypertensive chronic kidney disease with stage 1 through stage 4 chronic kidney disease, or unspecified chronic kidney disease: Secondary | ICD-10-CM | POA: Diagnosis not present

## 2016-03-22 DIAGNOSIS — Z825 Family history of asthma and other chronic lower respiratory diseases: Secondary | ICD-10-CM

## 2016-03-22 DIAGNOSIS — Z9889 Other specified postprocedural states: Secondary | ICD-10-CM

## 2016-03-22 DIAGNOSIS — M47812 Spondylosis without myelopathy or radiculopathy, cervical region: Secondary | ICD-10-CM | POA: Diagnosis not present

## 2016-03-22 DIAGNOSIS — E118 Type 2 diabetes mellitus with unspecified complications: Secondary | ICD-10-CM

## 2016-03-22 DIAGNOSIS — R918 Other nonspecific abnormal finding of lung field: Secondary | ICD-10-CM | POA: Diagnosis not present

## 2016-03-22 DIAGNOSIS — G039 Meningitis, unspecified: Secondary | ICD-10-CM | POA: Diagnosis present

## 2016-03-22 DIAGNOSIS — Z823 Family history of stroke: Secondary | ICD-10-CM

## 2016-03-22 DIAGNOSIS — I2510 Atherosclerotic heart disease of native coronary artery without angina pectoris: Secondary | ICD-10-CM | POA: Diagnosis not present

## 2016-03-22 DIAGNOSIS — I6521 Occlusion and stenosis of right carotid artery: Secondary | ICD-10-CM | POA: Diagnosis not present

## 2016-03-22 DIAGNOSIS — Z888 Allergy status to other drugs, medicaments and biological substances status: Secondary | ICD-10-CM

## 2016-03-22 DIAGNOSIS — Z7984 Long term (current) use of oral hypoglycemic drugs: Secondary | ICD-10-CM

## 2016-03-22 DIAGNOSIS — E1122 Type 2 diabetes mellitus with diabetic chronic kidney disease: Secondary | ICD-10-CM | POA: Diagnosis present

## 2016-03-22 DIAGNOSIS — Z8249 Family history of ischemic heart disease and other diseases of the circulatory system: Secondary | ICD-10-CM

## 2016-03-22 DIAGNOSIS — Z96652 Presence of left artificial knee joint: Secondary | ICD-10-CM | POA: Diagnosis present

## 2016-03-22 DIAGNOSIS — I739 Peripheral vascular disease, unspecified: Secondary | ICD-10-CM

## 2016-03-22 DIAGNOSIS — Z79899 Other long term (current) drug therapy: Secondary | ICD-10-CM

## 2016-03-22 DIAGNOSIS — Z87891 Personal history of nicotine dependence: Secondary | ICD-10-CM

## 2016-03-22 DIAGNOSIS — Z7901 Long term (current) use of anticoagulants: Secondary | ICD-10-CM

## 2016-03-22 LAB — BASIC METABOLIC PANEL
ANION GAP: 9 (ref 5–15)
BUN: 14 mg/dL (ref 6–20)
CALCIUM: 9.8 mg/dL (ref 8.9–10.3)
CO2: 25 mmol/L (ref 22–32)
Chloride: 104 mmol/L (ref 101–111)
Creatinine, Ser: 1.38 mg/dL — ABNORMAL HIGH (ref 0.61–1.24)
GFR calc Af Amer: 57 mL/min — ABNORMAL LOW (ref >60)
GFR, EST NON AFRICAN AMERICAN: 49 mL/min — AB (ref >60)
GLUCOSE: 188 mg/dL — AB (ref 65–99)
Potassium: 4.5 mmol/L (ref 3.5–5.1)
Sodium: 138 mmol/L (ref 135–145)

## 2016-03-22 LAB — PROTIME-INR
INR: 1.21
Prothrombin Time: 15.3 seconds — ABNORMAL HIGH (ref 11.4–15.2)

## 2016-03-22 LAB — CBC WITH DIFFERENTIAL/PLATELET
BASOS PCT: 0 %
Band Neutrophils: 3 %
Basophils Absolute: 0 10*3/uL (ref 0.0–0.1)
Blasts: 0 %
EOS PCT: 1 %
Eosinophils Absolute: 0.1 10*3/uL (ref 0.0–0.7)
HEMATOCRIT: 33.1 % — AB (ref 39.0–52.0)
Hemoglobin: 10.8 g/dL — ABNORMAL LOW (ref 13.0–17.0)
LYMPHS ABS: 3.3 10*3/uL (ref 0.7–4.0)
LYMPHS PCT: 23 %
MCH: 30.5 pg (ref 26.0–34.0)
MCHC: 32.6 g/dL (ref 30.0–36.0)
MCV: 93.5 fL (ref 78.0–100.0)
MONO ABS: 1.4 10*3/uL — AB (ref 0.1–1.0)
MYELOCYTES: 0 %
Metamyelocytes Relative: 0 %
Monocytes Relative: 10 %
NEUTROS PCT: 63 %
NRBC: 0 /100{WBCs}
Neutro Abs: 9.5 10*3/uL — ABNORMAL HIGH (ref 1.7–7.7)
OTHER: 0 %
PLATELETS: 308 10*3/uL (ref 150–400)
PROMYELOCYTES ABS: 0 %
RBC: 3.54 MIL/uL — AB (ref 4.22–5.81)
RDW: 13.6 % (ref 11.5–15.5)
WBC: 14.3 10*3/uL — AB (ref 4.0–10.5)

## 2016-03-22 LAB — GLUCOSE, CAPILLARY: Glucose-Capillary: 240 mg/dL — ABNORMAL HIGH (ref 65–99)

## 2016-03-22 MED ORDER — INSULIN ASPART 100 UNIT/ML ~~LOC~~ SOLN
0.0000 [IU] | Freq: Three times a day (TID) | SUBCUTANEOUS | Status: DC
  Administered 2016-03-23: 8 [IU] via SUBCUTANEOUS
  Administered 2016-03-23: 5 [IU] via SUBCUTANEOUS
  Administered 2016-03-23 – 2016-03-24 (×2): 8 [IU] via SUBCUTANEOUS

## 2016-03-22 MED ORDER — OXYCODONE-ACETAMINOPHEN 5-325 MG PO TABS: 1.0000 | ORAL_TABLET | ORAL | Status: DC | PRN

## 2016-03-22 MED ORDER — SODIUM CHLORIDE 0.9 % IV SOLN
2.0000 g | Freq: Once | INTRAVENOUS | Status: AC
  Administered 2016-03-22: 2 g via INTRAVENOUS
  Filled 2016-03-22: qty 2000

## 2016-03-22 MED ORDER — DEXAMETHASONE SODIUM PHOSPHATE 4 MG/ML IJ SOLN
4.0000 mg | Freq: Four times a day (QID) | INTRAMUSCULAR | Status: DC
  Administered 2016-03-22 – 2016-03-23 (×3): 4 mg via INTRAVENOUS
  Filled 2016-03-22 (×3): qty 1

## 2016-03-22 MED ORDER — METOPROLOL TARTRATE 50 MG PO TABS
50.0000 mg | ORAL_TABLET | Freq: Two times a day (BID) | ORAL | Status: DC
  Administered 2016-03-22 – 2016-03-24 (×4): 50 mg via ORAL
  Filled 2016-03-22 (×4): qty 1

## 2016-03-22 MED ORDER — DOCUSATE SODIUM 100 MG PO CAPS
100.0000 mg | ORAL_CAPSULE | Freq: Two times a day (BID) | ORAL | Status: DC
  Administered 2016-03-22 – 2016-03-24 (×4): 100 mg via ORAL
  Filled 2016-03-22 (×6): qty 1

## 2016-03-22 MED ORDER — PIOGLITAZONE HCL 15 MG PO TABS
15.0000 mg | ORAL_TABLET | Freq: Every day | ORAL | Status: DC
  Administered 2016-03-22 – 2016-03-24 (×3): 15 mg via ORAL
  Filled 2016-03-22 (×3): qty 1

## 2016-03-22 MED ORDER — SODIUM CHLORIDE 0.9 % IV SOLN
INTRAVENOUS | Status: DC
  Administered 2016-03-22: 17:00:00 via INTRAVENOUS

## 2016-03-22 MED ORDER — INSULIN ASPART 100 UNIT/ML ~~LOC~~ SOLN
0.0000 [IU] | Freq: Every day | SUBCUTANEOUS | Status: DC
  Administered 2016-03-22: 2 [IU] via SUBCUTANEOUS
  Administered 2016-03-23: 3 [IU] via SUBCUTANEOUS

## 2016-03-22 MED ORDER — VANCOMYCIN HCL IN DEXTROSE 1-5 GM/200ML-% IV SOLN
1000.0000 mg | Freq: Two times a day (BID) | INTRAVENOUS | Status: DC
  Administered 2016-03-23: 1000 mg via INTRAVENOUS
  Filled 2016-03-22 (×2): qty 200

## 2016-03-22 MED ORDER — ONDANSETRON HCL 4 MG PO TABS: 4.0000 mg | ORAL_TABLET | Freq: Three times a day (TID) | ORAL | Status: DC | PRN

## 2016-03-22 MED ORDER — IOPAMIDOL (ISOVUE-370) INJECTION 76%
INTRAVENOUS | Status: AC
  Administered 2016-03-22: 70 mL
  Filled 2016-03-22: qty 100

## 2016-03-22 MED ORDER — LISINOPRIL 10 MG PO TABS
10.0000 mg | ORAL_TABLET | Freq: Every day | ORAL | Status: DC
  Administered 2016-03-22 – 2016-03-24 (×3): 10 mg via ORAL
  Filled 2016-03-22 (×3): qty 1

## 2016-03-22 MED ORDER — DEXTROSE 5 % IV SOLN
2.0000 g | Freq: Two times a day (BID) | INTRAVENOUS | Status: DC
  Administered 2016-03-23: 2 g via INTRAVENOUS
  Filled 2016-03-22 (×2): qty 2

## 2016-03-22 MED ORDER — METHOCARBAMOL 500 MG PO TABS
500.0000 mg | ORAL_TABLET | Freq: Four times a day (QID) | ORAL | Status: DC | PRN
  Administered 2016-03-22 – 2016-03-23 (×2): 500 mg via ORAL
  Filled 2016-03-22 (×2): qty 1

## 2016-03-22 MED ORDER — SODIUM CHLORIDE 0.9 % IV SOLN
2.0000 g | INTRAVENOUS | Status: DC
  Administered 2016-03-22 – 2016-03-23 (×4): 2 g via INTRAVENOUS
  Filled 2016-03-22 (×7): qty 2000

## 2016-03-22 MED ORDER — VANCOMYCIN HCL IN DEXTROSE 1-5 GM/200ML-% IV SOLN: 1000.0000 mg | Freq: Once | INTRAVENOUS | Status: DC

## 2016-03-22 MED ORDER — OMEGA-3-ACID ETHYL ESTERS 1 G PO CAPS
1.0000 g | ORAL_CAPSULE | Freq: Two times a day (BID) | ORAL | Status: DC
  Administered 2016-03-22 – 2016-03-24 (×4): 1 g via ORAL
  Filled 2016-03-22 (×5): qty 1

## 2016-03-22 MED ORDER — DEXTROSE 5 % IV SOLN
2.0000 g | INTRAVENOUS | Status: AC
  Administered 2016-03-22: 2 g via INTRAVENOUS
  Filled 2016-03-22: qty 2

## 2016-03-22 MED ORDER — PANTOPRAZOLE SODIUM 40 MG PO TBEC
40.0000 mg | DELAYED_RELEASE_TABLET | Freq: Every day | ORAL | Status: DC
Start: 2016-03-23 — End: 2016-03-24
  Administered 2016-03-23 – 2016-03-24 (×2): 40 mg via ORAL
  Filled 2016-03-22 (×2): qty 1

## 2016-03-22 MED ORDER — ATORVASTATIN CALCIUM 40 MG PO TABS
40.0000 mg | ORAL_TABLET | Freq: Every day | ORAL | Status: DC
  Administered 2016-03-23: 40 mg via ORAL
  Filled 2016-03-22: qty 1

## 2016-03-22 MED ORDER — VANCOMYCIN HCL 10 G IV SOLR
2000.0000 mg | Freq: Once | INTRAVENOUS | Status: AC
  Administered 2016-03-22: 2000 mg via INTRAVENOUS
  Filled 2016-03-22: qty 2000

## 2016-03-22 MED ORDER — DEXTROSE 5 % IV SOLN
2.0000 g | Freq: Once | INTRAVENOUS | Status: DC
  Filled 2016-03-22: qty 2

## 2016-03-22 NOTE — Progress Notes (Addendum)
Pharmacy Antibiotic Note  Brad Day is a 75 y.o. male admitted on 03/22/2016 with complaints of neck pain and head pain that started on Wednesday with some neck stiffness. Patient denies injury or fever. Pharmacy has been consulted for vancomycin dosing for suspected meningitis.  SCr 1.38, estimated CrCl is 60 ml/min Verified patient's weight with ED nurse = 120 kg  ED orders: Ampicillin 2gm IV x1, Ceftriaxone 2gm IV x1   Plan: Vancomycin 2gm IV x1 then 1g IV q12h   F/u clinical progress, renal function, cultures.  Vanc trough at steady state per protocol  Height: 5\' 10"  (177.8 cm) Weight: 265 lb (120.2 kg) IBW/kg (Calculated) : 73  Temp (24hrs), Avg:98.6 F (37 C), Min:98.6 F (37 C), Max:98.6 F (37 C)   Recent Labs Lab 03/22/16 1046  WBC 14.3*  CREATININE 1.38*    Estimated Creatinine Clearance: 61 mL/min (by C-G formula based on SCr of 1.38 mg/dL (H)).    Allergies  Allergen Reactions  . Asa [Aspirin] Other (See Comments)    Causes bleeding Ulcer   Past Medical History:  Diagnosis Date  . Arthritis   . Carotid artery occlusion   . Coronary artery disease   . Diabetes mellitus without complication ([place])   . Fall down embankment Oct. 26, 2016   Hurt Right shoulder  . History of bleeding ulcers   . Hypertension   . Shortness of breath dyspnea    with exertion  . Stroke [facility])    4 mini strokes     Antimicrobials this admission:  Vancomycin 9/17>>  Ampicillin 9/17 x1 Ceftriaxone 9/17 x1   Dose adjustments this admission: n/a   Microbiology results: 9/17 BCx: sent 9/17  UCx: sent  Thank you for allowing pharmacy to be a part of this patient's care. Nicole Cella, RPh Clinical Pharmacist Pager: [phone] 03/22/2016 4:22 PM   ADDENDUM: Pharmacy consulted for Ampicillin IV and Ceftriaxone IV for suspected meningitis and continue IV Vancomycin.  SCr 1.38, CrCl ~ 60 ml/min Ampicillin 2gm IV x1 given in ED    PLAN:  Ampicillin 2gm IV  q4h Ceftriaxone 2gm IV q12h Continue vancomycin as above  Nicole Cella, RPh Clinical Pharmacist Pager: [phone] 03/22/2016 7:19 PM

## 2016-03-22 NOTE — ED Triage Notes (Signed)
Per Pt, Pt is coming from home with complaints of neck pain and head pain that started on Wednesday with some neck stiffness. Denies injury or fever. Complains of neck stiffness with pain. Pt had knee surgery one month ago with no complications.

## 2016-03-22 NOTE — H&P (Signed)
History and Physical    Brad Day [id number] DOB: February 21, 1941 DOA: 03/22/2016   PCP: Orpah Melter, MD   Patient coming from/Resides with: Private residence/lives with spouse  Admission status: Observation/medical neuro floor  Chief Complaint: Headache and neck pain  HPI: Quaran Brad Day is a 75 y.o. male with medical history significant for hypertension, diabetes on oral agents, dyslipidemia, chronic kidney disease stage III, recent left knee surgery with spinal anesthesia and on Xarelto for postoperative DVT prophylaxis, and known bilateral carotid artery disease history of prior stroke. Patient reports that last Wednesday he developed what he thought was a "crick in his neck" but the pain was so severe he was unable to turn his head in either direction nor extend his neck backwards or flex his neck forward. He was having chills and developed a very severe headache. He has not had fevers, photophobia or nausea. He has been anorexic and has not been eating as usual. He has Percocet available as well as Robaxin from his recent knee surgery and this has not helped his pain. Because of the pain he's been unable to sleep.  ED Course:  Vital Signs: BP 109/88   Pulse 85   Temp 98.6 F (37 C) (Oral)   Resp 16   Ht 5\' 10"  (1.778 m)   Wt 120.2 kg (265 lb)   SpO2 100%   BMI 38.02 kg/m  CT angiography of the neck: No recurrent stenosis or dissection and no findings to explain patient's neck pain CT head without contrast: Chronic left ACA territory frontal lobe infarct, no acute findings to explain patient's headache Lab data: Sodium 138, potassium 4.5, chloride 104, CO2 25, BUN 14, creatinine 1.38, glucose 188, WBC 14,300 with neutrophils 63% and absolute neutrophils 9.5%, hemoglobin 10.8, platelets 308,000, PT 15.3, INR 1.21 Medications and treatments: Ampicillin 2 g IV, Rocephin 2 g IV and vancomycin 2 g IV ordered by EDP but I asked him to place on hold until I could discuss options for  early LP with neurology.  Review of Systems:  In addition to the HPI above,  No Fever, myalgias or other constitutional symptoms No changes with Vision or hearing, new weakness, tingling, numbness in any extremity;-describes nuchal rigidity as above No problems swallowing food or Liquids, indigestion/reflux No Chest pain, Cough or Shortness of Breath, palpitations, orthopnea or DOE No Abdominal pain, N/V; no melena or hematochezia, no dark tarry stools No dysuria, hematuria or flank pain No new skin rashes, lesions, masses or bruises, No new joints pains-aches No recent weight gain or loss No polyuria, polydypsia or polyphagia,   Past Medical History:  Diagnosis Date  . Arthritis   . Carotid artery occlusion   . Coronary artery disease   . Diabetes mellitus without complication ([place])   . Fall down embankment Oct. 26, 2016   Hurt Right shoulder  . History of bleeding ulcers   . Hypertension   . Shortness of breath dyspnea    with exertion  . Stroke [facility])    4 mini strokes    Past Surgical History:  Procedure Laterality Date  . CAROTID ENDARTERECTOMY  2007   Left CEA  . CORONARY ARTERY BYPASS GRAFT  2001   Dr. Cyndia Bent  . EYE SURGERY Right Sept.  1, 2014   Cataract  . EYE SURGERY Left Sept.   27, 2014   Cataract  . TOTAL KNEE ARTHROPLASTY Left 02/25/2016   Procedure: TOTAL KNEE ARTHROPLASTY;  Surgeon: Renette Butters, MD;  Location: [facility]  OR;  Service: Orthopedics;  Laterality: Left;    Social History   Social History  . Marital status: Married    Spouse name: N/A  . Number of children: N/A  . Years of education: N/A   Occupational History  . Not on file.   Social History Main Topics  . Smoking status: Former Smoker    Quit date: 07/06/1976  . Smokeless tobacco: Former Systems developer    Types: Chew  . Alcohol use No  . Drug use: No  . Sexual activity: Not on file   Other Topics Concern  . Not on file   Social History Narrative  . No narrative on file    Mobility:  Utilizes a cane Work history: Retired   Allergies  Allergen Reactions  . Asa [Aspirin] Other (See Comments)    Causes bleeding Ulcer    Family History  Problem Relation Age of Onset  . Stroke Mother   . Heart disease Father     Heart Disease before age 58  . Heart attack Father     X's 66  . Emphysema Sister   . Other Brother     train accident   . Cancer Brother   . Alzheimer's disease Brother      Prior to Admission medications   Medication Sig Start Date End Date Taking? Authorizing Provider  atorvastatin (LIPITOR) 40 MG tablet Take 40 mg by mouth daily.  03/30/15  Yes Historical Provider, MD  docusate sodium (COLACE) 100 MG capsule Take 1 capsule (100 mg total) by mouth 2 (two) times daily. To prevent constipation while taking pain medication. Patient taking differently: Take 100 mg by mouth daily. To prevent constipation while taking pain medication. 02/25/16  Yes Renette Butters, MD  lisinopril (PRINIVIL,ZESTRIL) 10 MG tablet Take 10 mg by mouth at bedtime.    Yes Historical Provider, MD  metFORMIN (GLUCOPHAGE) 500 MG tablet Take 500 mg by mouth 2 (two) times daily with a meal.   Yes Historical Provider, MD  methocarbamol (ROBAXIN) 500 MG tablet Take 1 tablet (500 mg total) by mouth every 6 (six) hours as needed for muscle spasms. 02/25/16  Yes Renette Butters, MD  metoprolol (LOPRESSOR) 50 MG tablet Take 50 mg by mouth 2 (two) times daily.   Yes Historical Provider, MD  Omega-3 Fatty Acids (FISH OIL) 1200 MG CAPS Take 1,200 mg by mouth 2 (two) times daily.   Yes Historical Provider, MD  ondansetron (ZOFRAN) 4 MG tablet Take 1 tablet (4 mg total) by mouth every 8 (eight) hours as needed for nausea or vomiting. 02/25/16  Yes Renette Butters, MD  oxyCODONE-acetaminophen (ROXICET) 5-325 MG tablet Take 1-2 tablets by mouth every 4 (four) hours as needed for severe pain. Patient taking differently: Take 1 tablet by mouth every 4 (four) hours as needed for severe pain.  02/25/16   Yes Renette Butters, MD  pantoprazole (PROTONIX) 40 MG tablet Take 40 mg by mouth daily.   Yes Historical Provider, MD  pioglitazone (ACTOS) 15 MG tablet Take 15 mg by mouth at bedtime.  04/02/15  Yes Historical Provider, MD  rivaroxaban (XARELTO) 10 MG TABS tablet Take 1 tablet (10 mg total) by mouth daily. Patient taking differently: Take 10 mg by mouth daily with breakfast.  02/25/16  Yes Renette Butters, MD  oxyCODONE-acetaminophen (PERCOCET/ROXICET) 5-325 MG tablet Take 1 tablet by mouth every 4 (four) hours as needed for severe pain. Patient not taking: Reported on 03/22/2016 06/14/15   Shawn C  Caryl Asp, PA-C    Physical Exam: Vitals:   03/22/16 1700 03/22/16 1745 03/22/16 1800 03/22/16 1904  BP: 128/66 132/74 132/69 140/66  Pulse: 80  79 89  Resp:    18  Temp:    99 F (37.2 C)  TempSrc:    Oral  SpO2: 99%  100% 98%  Weight:    119 kg (262 lb 6.4 oz)  Height:    5\' 11"  (1.803 m)      Constitutional: NAD, calm, uncomfortable and noted ongoing neck pain and severe headache Eyes: PERRL, lids and conjunctivae normal ENMT: Mucous membranes are moist. Posterior pharynx clear of any exudate or lesions.Normal dentition.  Neck: normal, tense, no masses, no thyromegaly-reproducible nuchal rigidity Respiratory: clear to auscultation bilaterally, no wheezing, no crackles. Normal respiratory effort. No accessory muscle use.  Cardiovascular: Regular rate and rhythm, no murmurs / rubs / gallops. No extremity edema. 2+ pedal pulses. No carotid bruits.  Abdomen: no tenderness, no masses palpated. No hepatosplenomegaly. Bowel sounds positive.  Musculoskeletal: no clubbing / cyanosis. No joint deformity upper and lower extremities. Good ROM, no contractures. Normal muscle tone. Incision site over the left knee well healed without any drainage or erythema Skin: no rashes, lesions, ulcers. No induration Neurologic: CN 2-12 grossly intact. Sensation intact, DTR normal. Strength 5/5 x all 4 extremities.  Subjective reports of severe headache but denies photophobia-positive nuchal rigidity as above Psychiatric: Normal judgment and insight. Alert and oriented x 3. Normal mood.    Labs on Admission: I have personally reviewed following labs and imaging studies  CBC:  Recent Labs Lab 03/22/16 1046  WBC 14.3*  NEUTROABS 9.5*  HGB 10.8*  HCT 33.1*  MCV 93.5  PLT 263   Basic Metabolic Panel:  Recent Labs Lab 03/22/16 1046  NA 138  K 4.5  CL 104  CO2 25  GLUCOSE 188*  BUN 14  CREATININE 1.38*  CALCIUM 9.8   GFR: Estimated Creatinine Clearance: 61.6 mL/min (by C-G formula based on SCr of 1.38 mg/dL (H)). Liver Function Tests: No results for input(s): AST, ALT, ALKPHOS, BILITOT, PROT, ALBUMIN in the last 168 hours. No results for input(s): LIPASE, AMYLASE in the last 168 hours. No results for input(s): AMMONIA in the last 168 hours. Coagulation Profile:  Recent Labs Lab 03/22/16 1046  INR 1.21   Cardiac Enzymes: No results for input(s): CKTOTAL, CKMB, CKMBINDEX, TROPONINI in the last 168 hours. BNP (last 3 results) No results for input(s): PROBNP in the last 8760 hours. HbA1C: No results for input(s): HGBA1C in the last 72 hours. CBG: No results for input(s): GLUCAP in the last 168 hours. Lipid Profile: No results for input(s): CHOL, HDL, LDLCALC, TRIG, CHOLHDL, LDLDIRECT in the last 72 hours. Thyroid Function Tests: No results for input(s): TSH, T4TOTAL, FREET4, T3FREE, THYROIDAB in the last 72 hours. Anemia Panel: No results for input(s): VITAMINB12, FOLATE, FERRITIN, TIBC, IRON, RETICCTPCT in the last 72 hours. Urine analysis:    Component Value Date/Time   COLORURINE YELLOW 02/14/2016 [place] 02/14/2016 1248   LABSPEC 1.020 02/14/2016 1248   PHURINE 5.5 02/14/2016 1248   GLUCOSEU NEGATIVE 02/14/2016 1248   HGBUR NEGATIVE 02/14/2016 1248   BILIRUBINUR NEGATIVE 02/14/2016 1248   KETONESUR NEGATIVE 02/14/2016 1248   PROTEINUR NEGATIVE  02/14/2016 1248   UROBILINOGEN 0.2 09/07/2009 1319   NITRITE NEGATIVE 02/14/2016 1248   LEUKOCYTESUR SMALL (A) 02/14/2016 1248   Sepsis Labs: @LABRCNTIP (procalcitonin:4,lacticidven:4) )No results found for this or any previous visit (from the past  240 hour(s)).   Radiological Exams on Admission: Dg Chest 2 View  Result Date: 03/22/2016 CLINICAL DATA:  Headache and neck stiffness 5 days. Elevated white blood cell count. Knee surgery 4 weeks ago. EXAM: CHEST  2 VIEW COMPARISON:  09/07/2009 and 03/16/2006 FINDINGS: Sternotomy wires unchanged. Lungs are adequately inflated without consolidation or effusion. Cardiomediastinal silhouette and remainder of the exam is unchanged. IMPRESSION: No active cardiopulmonary disease. Electronically Signed   By: Marin Olp M.D.   On: 03/22/2016 18:39   Ct Head Wo Contrast  Result Date: 03/22/2016 CLINICAL DATA:  Neck stiffness and headache for several days. EXAM: CT HEAD WITHOUT CONTRAST TECHNIQUE: Contiguous axial images were obtained from the base of the skull through the vertex without intravenous contrast. COMPARISON:  CTA neck reported separately.  CT head 02/05/2006. FINDINGS: Brain: No evidence of acute infarction, hemorrhage, hydrocephalus, extra-axial collection or mass lesion/mass effect. Mild atrophy. Hypoattenuation of white matter suggesting chronic microvascular ischemic change. Remote LEFT frontal parasagittal ACA territory infarct. Vascular: Extensive vascular calcification in the skull base internal carotid arteries, greater on the LEFT. No hyperdense MCA sign. Skull: Normal. Negative for fracture or focal lesion. Sinuses/Orbits: No acute finding. Other: None. Compared with prior imaging studies, the LEFT frontal infarct was acute in 2007 IMPRESSION: Chronic changes as described.  No acute intracranial findings. Chronic LEFT ACA territory frontal lobe infarct. Electronically Signed   By: Staci Righter M.D.   On: 03/22/2016 14:58   Ct Angio Neck W  And/or Wo Contrast  Result Date: 03/22/2016 CLINICAL DATA:  Neck pain and head pain neck started 4-5 days ago with neck stiffness. EXAM: CT ANGIOGRAPHY NECK TECHNIQUE: Multidetector CT imaging of the neck was performed using the standard protocol during bolus administration of intravenous contrast. Multiplanar CT image reconstructions and MIPs were obtained to evaluate the vascular anatomy. Carotid stenosis measurements (when applicable) are obtained utilizing NASCET criteria, using the distal internal carotid diameter as the denominator. CONTRAST:  Isovue 370, 50 mL. COMPARISON:  None FINDINGS: Aortic arch: Standard branching. Imaged portion shows no evidence of aneurysm or dissection. No significant stenosis of the major arch vessel origins. Minor atheromatous change transverse arch. Right carotid system: Moderate circumferential soft plaque in the proximal LEFT ICA, non stenotic based on luminal measurements of 3.1/4.1. No dissection. Left carotid system: Minor atheromatous change at the bifurcation. Post endarterectomy appearance, without significant cuff defect. No evidence of dissection, stenosis (50% or greater) or occlusion. Vertebral arteries: LEFT dominant but both are patent. No evidence of dissection, stenosis (50% or greater) or occlusion. Skeletal:  No fracture or malalignment.  Moderate spondylosis. Other neck: No neck masses. Upper chest: Lung apices clear.  Prior CABG. IMPRESSION: Post endarterectomy changes, LEFT ICA. No recurrent stenosis or dissection. Minor atheromatous change RIGHT carotid bifurcation without flow limiting stenosis or dissection. No vertebral artery disease of significance. Cervical spondylosis.  No fracture. Electronically Signed   By: Staci Righter M.D.   On: 03/22/2016 15:02    Assessment/Plan Principal Problem:   Suspected Meningitis/severe headache -Patient presents abrupt onset of significant neck stiffness and severe headache associated with chills without nausea  or photophobia although patient does have anorexia. Clinical exam consistent with positive nuchal rigidity and this is associated with leukocytosis-long these findings concerning for possible meningitis -Discussed with neurology Dr. Leonel Ramsay about proceeding with LP; although greater than 24 hours since last Xarelto dose given patient's chronic kidney disease and decreased GFR the literature states best to wait 48 hours therefore patient will not be eligible  to undergo LP until 9:30 AM or later on 9/18 -Neurology states to proceed with broad-spectrum empiric antibiotic therapy so previously ordered antibiotics released for RN to administer while in the ER -Begin scheduled Decadron with Percocet prn to help treat headache -Given chronic kidney disease will avoid Toradol or other NSAIDs -Please request LP to be done tomorrow by appropriate personnel -No focal neurological findings except for nuchal rigidity -Blood cultures obtained in ER -Droplet precautions  Active Problems:   Leukocytosis -We'll check urinalysis and culture as well as chest x-ray to rule out other causes for leukocytosis    Bilateral carotid artery disease  -CT of neck reveals no evidence of vascular dissection to explain patient's neck pain and headache    HTN (hypertension) -Current blood pressure controlled -Continue preadmission lisinopril and Lopressor    CKD (chronic kidney disease) stage 3, GFR 30-59 ml/min -Baseline renal function in 2011 was 18/1.21 -Renal function on 8/11 was 22 and 1.44 with current renal function 14/1.38 -Has had mild progression and chronic kidney disease likely related to underlying diabetic nephropathy -Avoid nephrotoxic agents when possible    Diabetes mellitus type 2, controlled with complication - hold preadmission metformin but continue Actos  -Follow CBGs and provide SSI  -Check hemoglobin A1c     HLD (hyperlipidemia) -Continue preadmission Lipitor     History of left knee  surgery -Holding post operative Xarelto that was ordered for DVT prophylaxis  -Left knee healing well       DVT prophylaxis: Xarelto on hold in favor of SCDs in anticipation of lumbar puncture  Code Status: full Family Communication: wife at bedside Disposition Plan: Anticipate discharge but preadmission home environment once medically stable Consults called: telephone consultation with Dr. Leonel Ramsay with neurology as documented above     Elfrida Pixley, Geraldo Docker ANP-BC [facility] Pager [phone]   If 7PM-7AM, please contact night-coverage [email or web address] Password [facility]  03/22/2016, 8:15 PM  Care during the described time interval was provided by ANP Erin Hearing and myself .  I have reviewed this patient's available data, including medical history, events of note, physical examination, and all test results as part of my evaluation. I have personally reviewed and interpreted all radiology studies.

## 2016-03-22 NOTE — ED Notes (Signed)
Attempted report 

## 2016-03-22 NOTE — ED Notes (Signed)
PT taken a drink and crackers.

## 2016-03-22 NOTE — ED Notes (Signed)
Care handoff to [facility]

## 2016-03-22 NOTE — ED Provider Notes (Signed)
[place] DEPT Provider Note   CSN: [id number] Arrival date & time: 03/22/16  0900     History   Chief Complaint Chief Complaint  Patient presents with  . Neck Pain    HPI Brad Day is a 75 y.o. male with a past medical history significant for CAD with 5X CABG, stroke, hypertension, diabetes, and recent left knee surgery on Xarelto who presents with 5 days of severe neck pain, neck stiffness, and headache. Patient denies any specific neurologic deficits including no vision changes, numbness, tingling, or weakness of extremities. Patient denies nausea, vomiting, constipation, diarrhea, or dysuria. Patient denies chest pain, shortness of breath. Patient does report mild sore throat but no fevers or chills. Patient denies pain in the middle or low back. Patient reports the headache is up to a 12 out of 10 if he tries to move his neck. Patient denies any history of vertebral artery dissection but his chart does report a history of carotid disease. Patient also has history of recent spinal blocks during his last knee surgery.  The history is provided by the patient, the spouse and medical records. No language interpreter was used.  Headache   This is a new problem. The current episode started more than 2 days ago (5 days ago upon waking in AM). The problem occurs constantly. The problem has been gradually worsening. The headache is associated with nothing. The pain is located in the occipital region. The quality of the pain is described as dull. The pain is at a severity of 10/10. The pain is severe. The pain radiates to the left neck and right neck. Pertinent negatives include no fever, no chest pressure, no near-syncope, no palpitations, no syncope, no shortness of breath, no nausea and no vomiting. He has tried nothing for the symptoms. The treatment provided no relief.    Past Medical History:  Diagnosis Date  . Arthritis   . Carotid artery occlusion   . Coronary artery disease     . Diabetes mellitus without complication ([place])   . Fall down embankment Oct. 26, 2016   Hurt Right shoulder  . History of bleeding ulcers   . Hypertension   . Shortness of breath dyspnea    with exertion  . Stroke [facility])    4 mini strokes    Patient Active Problem List   Diagnosis Date Noted  . Primary osteoarthritis of left knee 02/04/2016  . History of CVA (cerebrovascular accident) without residual deficits 02/04/2016  . History of coronary artery bypass, five 02/04/2016  . Bilateral carotid artery disease ([place]) 04/26/2014  . Aftercare following surgery of the circulatory system, [place] 04/15/2012  . Occlusion and stenosis of carotid artery without mention of cerebral infarction 04/15/2012    Past Surgical History:  Procedure Laterality Date  . CAROTID ENDARTERECTOMY  2007   Left CEA  . CORONARY ARTERY BYPASS GRAFT  2001   Dr. Cyndia Bent  . EYE SURGERY Right Sept.  1, 2014   Cataract  . EYE SURGERY Left Sept.   27, 2014   Cataract  . TOTAL KNEE ARTHROPLASTY Left 02/25/2016   Procedure: TOTAL KNEE ARTHROPLASTY;  Surgeon: Renette Butters, MD;  Location: [facility];  Service: Orthopedics;  Laterality: Left;       Home Medications    Prior to Admission medications   Medication Sig Start Date End Date Taking? Authorizing Provider  atorvastatin (LIPITOR) 40 MG tablet Take 40 mg by mouth daily at 6 PM.  03/30/15   Historical Provider,  MD  docusate sodium (COLACE) 100 MG capsule Take 1 capsule (100 mg total) by mouth 2 (two) times daily. To prevent constipation while taking pain medication. 02/25/16   Renette Butters, MD  fish oil-omega-3 fatty acids 1000 MG capsule Take 1 g by mouth 2 (two) times daily.     Historical Provider, MD  lisinopril (PRINIVIL,ZESTRIL) 10 MG tablet Take 10 mg by mouth daily.    Historical Provider, MD  metFORMIN (GLUCOPHAGE) 500 MG tablet Take 500 mg by mouth 2 (two) times daily with a meal.    Historical Provider, MD  methocarbamol (ROBAXIN) 500 MG tablet  Take 1 tablet (500 mg total) by mouth every 6 (six) hours as needed for muscle spasms. 02/25/16   Renette Butters, MD  metoprolol (LOPRESSOR) 50 MG tablet Take 50 mg by mouth 2 (two) times daily.    Historical Provider, MD  niacin 500 MG tablet Take 500 mg by mouth daily with breakfast.    Historical Provider, MD  ondansetron (ZOFRAN) 4 MG tablet Take 1 tablet (4 mg total) by mouth every 8 (eight) hours as needed for nausea or vomiting. 02/25/16   Renette Butters, MD  oxyCODONE-acetaminophen (PERCOCET/ROXICET) 5-325 MG tablet Take 1 tablet by mouth every 4 (four) hours as needed for severe pain. 06/14/15   Shawn C Joy, PA-C  oxyCODONE-acetaminophen (ROXICET) 5-325 MG tablet Take 1-2 tablets by mouth every 4 (four) hours as needed for severe pain. 02/25/16   Renette Butters, MD  pantoprazole (PROTONIX) 40 MG tablet Take 40 mg by mouth daily.    Historical Provider, MD  pioglitazone (ACTOS) 15 MG tablet Take 15 mg by mouth daily.  04/02/15   Historical Provider, MD  rivaroxaban (XARELTO) 10 MG TABS tablet Take 1 tablet (10 mg total) by mouth daily. 02/25/16   Renette Butters, MD    Family History Family History  Problem Relation Age of Onset  . Stroke Mother   . Heart disease Father     Heart Disease before age 35  . Heart attack Father     X's 45  . Emphysema Sister   . Other Brother     train accident   . Cancer Brother   . Alzheimer's disease Brother     Social History Social History  Substance Use Topics  . Smoking status: Former Smoker    Quit date: 07/06/1976  . Smokeless tobacco: Former Systems developer    Types: Chew  . Alcohol use No     Allergies   Asa [aspirin]   Review of Systems Review of Systems  Constitutional: Negative for chills, diaphoresis, fatigue and fever.  HENT: Positive for sore throat. Negative for congestion and rhinorrhea.   Eyes: Negative for visual disturbance.  Respiratory: Negative for cough, chest tightness, shortness of breath, wheezing and stridor.    Cardiovascular: Negative for palpitations, syncope and near-syncope.  Gastrointestinal: Negative for abdominal pain, constipation, diarrhea, nausea and vomiting.  Genitourinary: Negative for dysuria.  Musculoskeletal: Positive for neck pain and neck stiffness. Negative for back pain.  Skin: Positive for wound (left knee surgical wound). Negative for rash.  Neurological: Positive for headaches. Negative for dizziness, tremors, seizures, speech difficulty, weakness, light-headedness and numbness.  Psychiatric/Behavioral: Negative for agitation and confusion.  All other systems reviewed and are negative.    Physical Exam Updated Vital Signs BP 116/66 (BP Location: Left Arm)   Pulse 73   Temp 98.6 F (37 C) (Oral)   Resp 16   Ht 5\' 10"  (1.778  m)   Wt 265 lb (120.2 kg)   SpO2 95%   BMI 38.02 kg/m   Physical Exam  Constitutional: He is oriented to person, place, and time. He appears well-developed and well-nourished. No distress.  HENT:  Head: Normocephalic and atraumatic. Head is without contusion.  Right Ear: External ear normal.  Left Ear: External ear normal.  Nose: Nose normal.  Mouth/Throat: Oropharynx is clear and moist. No trismus in the jaw. No oropharyngeal exudate, posterior oropharyngeal edema or tonsillar abscesses. No tonsillar exudate.  Eyes: Conjunctivae and EOM are normal. Pupils are equal, round, and reactive to light. Right eye exhibits normal extraocular motion. Left eye exhibits normal extraocular motion. Pupils are equal.  Neck: Trachea normal. Spinous process tenderness and muscular tenderness present. Neck rigidity present. Decreased range of motion present.  Cardiovascular: Normal rate and regular rhythm.   No murmur heard. Pulmonary/Chest: Effort normal and breath sounds normal. No respiratory distress. He has no wheezes. He has no rales. He exhibits no tenderness.  Abdominal: Soft. There is no tenderness. There is no guarding.  Musculoskeletal: He exhibits  tenderness. He exhibits no edema.       Left knee: No tenderness found.       Legs: Neurological: He is alert and oriented to person, place, and time. He has normal strength. He is not disoriented. He displays no tremor and normal reflexes. No cranial nerve deficit or sensory deficit. He exhibits normal muscle tone. He displays no seizure activity. Coordination normal. GCS eye subscore is 4. GCS verbal subscore is 5. GCS motor subscore is 6.  Skin: Skin is warm and dry. Capillary refill takes less than 2 seconds. He is not diaphoretic.  Psychiatric: He has a normal mood and affect.  Nursing note and vitals reviewed.    ED Treatments / Results  Labs (all labs ordered are listed, but only abnormal results are displayed) Labs Reviewed  PROTIME-INR - Abnormal; Notable for the following:       Result Value   Prothrombin Time 15.3 (*)    All other components within normal limits  CBC WITH DIFFERENTIAL/PLATELET - Abnormal; Notable for the following:    WBC 14.3 (*)    RBC 3.54 (*)    Hemoglobin 10.8 (*)    HCT 33.1 (*)    Neutro Abs 9.5 (*)    Monocytes Absolute 1.4 (*)    All other components within normal limits  BASIC METABOLIC PANEL - Abnormal; Notable for the following:    Glucose, Bld 188 (*)    Creatinine, Ser 1.38 (*)    GFR calc non Af Amer 49 (*)    GFR calc Af Amer 57 (*)    All other components within normal limits  GLUCOSE, CAPILLARY - Abnormal; Notable for the following:    Glucose-Capillary 240 (*)    All other components within normal limits  URINE CULTURE  CULTURE, BLOOD (ROUTINE X 2)  CULTURE, BLOOD (ROUTINE X 2)  URINALYSIS, ROUTINE W REFLEX MICROSCOPIC (NOT AT [facility])  HEMOGLOBIN Z6X  BASIC METABOLIC PANEL  CBC    EKG  EKG Interpretation None       Radiology Dg Chest 2 View  Result Date: 03/22/2016 CLINICAL DATA:  Headache and neck stiffness 5 days. Elevated white blood cell count. Knee surgery 4 weeks ago. EXAM: CHEST  2 VIEW COMPARISON:  09/07/2009  and 03/16/2006 FINDINGS: Sternotomy wires unchanged. Lungs are adequately inflated without consolidation or effusion. Cardiomediastinal silhouette and remainder of the exam is unchanged. IMPRESSION: No active  cardiopulmonary disease. Electronically Signed   By: Marin Olp M.D.   On: 03/22/2016 18:39   Ct Head Wo Contrast  Result Date: 03/22/2016 CLINICAL DATA:  Neck stiffness and headache for several days. EXAM: CT HEAD WITHOUT CONTRAST TECHNIQUE: Contiguous axial images were obtained from the base of the skull through the vertex without intravenous contrast. COMPARISON:  CTA neck reported separately.  CT head 02/05/2006. FINDINGS: Brain: No evidence of acute infarction, hemorrhage, hydrocephalus, extra-axial collection or mass lesion/mass effect. Mild atrophy. Hypoattenuation of white matter suggesting chronic microvascular ischemic change. Remote LEFT frontal parasagittal ACA territory infarct. Vascular: Extensive vascular calcification in the skull base internal carotid arteries, greater on the LEFT. No hyperdense MCA sign. Skull: Normal. Negative for fracture or focal lesion. Sinuses/Orbits: No acute finding. Other: None. Compared with prior imaging studies, the LEFT frontal infarct was acute in 2007 IMPRESSION: Chronic changes as described.  No acute intracranial findings. Chronic LEFT ACA territory frontal lobe infarct. Electronically Signed   By: Staci Righter M.D.   On: 03/22/2016 14:58   Ct Angio Neck W And/or Wo Contrast  Result Date: 03/22/2016 CLINICAL DATA:  Neck pain and head pain neck started 4-5 days ago with neck stiffness. EXAM: CT ANGIOGRAPHY NECK TECHNIQUE: Multidetector CT imaging of the neck was performed using the standard protocol during bolus administration of intravenous contrast. Multiplanar CT image reconstructions and MIPs were obtained to evaluate the vascular anatomy. Carotid stenosis measurements (when applicable) are obtained utilizing NASCET criteria, using the distal  internal carotid diameter as the denominator. CONTRAST:  Isovue 370, 50 mL. COMPARISON:  None FINDINGS: Aortic arch: Standard branching. Imaged portion shows no evidence of aneurysm or dissection. No significant stenosis of the major arch vessel origins. Minor atheromatous change transverse arch. Right carotid system: Moderate circumferential soft plaque in the proximal LEFT ICA, non stenotic based on luminal measurements of 3.1/4.1. No dissection. Left carotid system: Minor atheromatous change at the bifurcation. Post endarterectomy appearance, without significant cuff defect. No evidence of dissection, stenosis (50% or greater) or occlusion. Vertebral arteries: LEFT dominant but both are patent. No evidence of dissection, stenosis (50% or greater) or occlusion. Skeletal:  No fracture or malalignment.  Moderate spondylosis. Other neck: No neck masses. Upper chest: Lung apices clear.  Prior CABG. IMPRESSION: Post endarterectomy changes, LEFT ICA. No recurrent stenosis or dissection. Minor atheromatous change RIGHT carotid bifurcation without flow limiting stenosis or dissection. No vertebral artery disease of significance. Cervical spondylosis.  No fracture. Electronically Signed   By: Staci Righter M.D.   On: 03/22/2016 15:02    Procedures Procedures (including critical care time)  Medications Ordered in ED Medications  0.9 %  sodium chloride infusion ( Intravenous New Bag/Given 03/22/16 1644)  vancomycin (VANCOCIN) IVPB 1000 mg/200 mL premix (not administered)  insulin aspart (novoLOG) injection 0-15 Units (not administered)  insulin aspart (novoLOG) injection 0-5 Units (2 Units Subcutaneous Given 03/22/16 2143)  dexamethasone (DECADRON) injection 4 mg (4 mg Intravenous Given 03/22/16 1752)  methocarbamol (ROBAXIN) tablet 500 mg (500 mg Oral Given 03/22/16 1752)  pioglitazone (ACTOS) tablet 15 mg (not administered)  oxyCODONE-acetaminophen (PERCOCET/ROXICET) 5-325 MG per tablet 1-2 tablet (not  administered)  docusate sodium (COLACE) capsule 100 mg (100 mg Oral Given 03/22/16 2144)  ondansetron (ZOFRAN) tablet 4 mg (not administered)  atorvastatin (LIPITOR) tablet 40 mg (not administered)  omega-3 acid ethyl esters (LOVAZA) capsule 1 g (1 g Oral Given 03/22/16 2144)  lisinopril (PRINIVIL,ZESTRIL) tablet 10 mg (not administered)  metoprolol (LOPRESSOR) tablet 50 mg (50  mg Oral Given 03/22/16 2144)  pantoprazole (PROTONIX) EC tablet 40 mg (not administered)  cefTRIAXone (ROCEPHIN) 2 g in dextrose 5 % 50 mL IVPB (not administered)  ampicillin (OMNIPEN) 2 g in sodium chloride 0.9 % 50 mL IVPB (not administered)  iopamidol (ISOVUE-370) 76 % injection (70 mLs  Contrast Given 03/22/16 1324)  ampicillin (OMNIPEN) 2 g in sodium chloride 0.9 % 50 mL IVPB (0 g Intravenous Stopped 03/22/16 1746)  vancomycin (VANCOCIN) 2,000 mg in sodium chloride 0.9 % 500 mL IVPB (2,000 mg Intravenous Transfusing/Transfer 03/22/16 1848)  cefTRIAXone (ROCEPHIN) 2 g in dextrose 5 % 50 mL IVPB (2 g Intravenous Given 03/22/16 2143)     Initial Impression / Assessment and Plan / ED Course  I have reviewed the triage vital signs and the nursing notes.  Pertinent labs & imaging results that were available during my care of the patient were reviewed by me and considered in my medical decision making (see chart for details).  Clinical Course    Brad Day is a 75 y.o. male with a past medical history significant for CAD with 5X CABG, stroke, hypertension, diabetes, and recent left knee surgery on Xarelto who presents with 5 days of severe neck pain, neck stiffness, and headache. Given his risk factors, age, and history of stroke with carotid disease, workup will include imaging to help rule out vertebral artery dissection as etiology of neck stiffness, neck pain, and severe headache. CT of the head will be ordered to look for intracranial abnormality. Exam and history as above. Patient had no focal neurologic deficits on  exam. Patient had neck tenderness throughout. Patient's oropharyngeal exam showed no evidence of RPA, PTA, or [place]. Also considered is a paraspinal hematoma given his new Xarelto use. Meningitis considered as well as simple musculoskeletal pain. Laboratory testing ordered to look for infection.  Results of workup are seen above. Patient found to have elevated white blood cell count of 14.3. Hemoglobin slightly decreased at 10.8. Patient had elevation and creatinine of 1.38 with decreased GFR. INR elevated at 1.21.  Imaging results Showed no acute cardiopulmonary disease on x-ray, head CT showed prior strokes with no other acute intracranial abnormality, And CTA of the neck showed postsurgical changes from prior carotid endarterectomy but otherwise, no abnormalities that would explain patient's pain.  Given the finding of leukocytosis in the setting of nuchal rigidity and severe headache, meningitis remained on differential. Patient does report taking Xarelto yesterday morning and, due to patient's decreased kidney function, there is concern that Xarelto might still be affecting patients ability to receive Safe lumbar puncture at this time.  Due to risks of lumbar puncture in the setting of Xarelto at this time, decision made to start patient on broad-spectrum antibiotics, admit the patient, and observe while treating presumptive Meningitis. Patient will Likely receive lumbar puncture under fluoro to minimize trauma due to body habitus and anticoagulation after admission.  Patient and family agree with plan of treating possible meningitis. Family had no other questions patient was admitted in stable condition.    Final Clinical Impressions(s) / ED Diagnoses   Final diagnoses:  Neck pain  Acute intractable headache, unspecified headache type    New Prescriptions Current Discharge Medication List      Clinical Impression: 1. Neck pain   2. Acute intractable headache, unspecified headache  type     Disposition: Admit  Condition: Stable    Courtney Paris, MD 03/22/16 2223

## 2016-03-23 ENCOUNTER — Observation Stay (HOSPITAL_COMMUNITY): Payer: Medicare Other

## 2016-03-23 DIAGNOSIS — M47812 Spondylosis without myelopathy or radiculopathy, cervical region: Secondary | ICD-10-CM | POA: Diagnosis present

## 2016-03-23 DIAGNOSIS — Z96652 Presence of left artificial knee joint: Secondary | ICD-10-CM | POA: Diagnosis present

## 2016-03-23 DIAGNOSIS — Z7984 Long term (current) use of oral hypoglycemic drugs: Secondary | ICD-10-CM | POA: Diagnosis not present

## 2016-03-23 DIAGNOSIS — Z7901 Long term (current) use of anticoagulants: Secondary | ICD-10-CM | POA: Diagnosis not present

## 2016-03-23 DIAGNOSIS — Z8673 Personal history of transient ischemic attack (TIA), and cerebral infarction without residual deficits: Secondary | ICD-10-CM | POA: Diagnosis not present

## 2016-03-23 DIAGNOSIS — Z888 Allergy status to other drugs, medicaments and biological substances status: Secondary | ICD-10-CM | POA: Diagnosis not present

## 2016-03-23 DIAGNOSIS — G039 Meningitis, unspecified: Secondary | ICD-10-CM | POA: Diagnosis not present

## 2016-03-23 DIAGNOSIS — Z87891 Personal history of nicotine dependence: Secondary | ICD-10-CM | POA: Diagnosis not present

## 2016-03-23 DIAGNOSIS — Z8249 Family history of ischemic heart disease and other diseases of the circulatory system: Secondary | ICD-10-CM | POA: Diagnosis not present

## 2016-03-23 DIAGNOSIS — Z951 Presence of aortocoronary bypass graft: Secondary | ICD-10-CM | POA: Diagnosis not present

## 2016-03-23 DIAGNOSIS — M4802 Spinal stenosis, cervical region: Secondary | ICD-10-CM | POA: Diagnosis not present

## 2016-03-23 DIAGNOSIS — Z820 Family history of epilepsy and other diseases of the nervous system: Secondary | ICD-10-CM | POA: Diagnosis not present

## 2016-03-23 DIAGNOSIS — Z825 Family history of asthma and other chronic lower respiratory diseases: Secondary | ICD-10-CM | POA: Diagnosis not present

## 2016-03-23 DIAGNOSIS — E1122 Type 2 diabetes mellitus with diabetic chronic kidney disease: Secondary | ICD-10-CM | POA: Diagnosis present

## 2016-03-23 DIAGNOSIS — Z823 Family history of stroke: Secondary | ICD-10-CM | POA: Diagnosis not present

## 2016-03-23 DIAGNOSIS — I2510 Atherosclerotic heart disease of native coronary artery without angina pectoris: Secondary | ICD-10-CM | POA: Diagnosis present

## 2016-03-23 DIAGNOSIS — M542 Cervicalgia: Secondary | ICD-10-CM | POA: Diagnosis present

## 2016-03-23 DIAGNOSIS — I129 Hypertensive chronic kidney disease with stage 1 through stage 4 chronic kidney disease, or unspecified chronic kidney disease: Secondary | ICD-10-CM | POA: Diagnosis present

## 2016-03-23 DIAGNOSIS — N183 Chronic kidney disease, stage 3 (moderate): Secondary | ICD-10-CM | POA: Diagnosis present

## 2016-03-23 DIAGNOSIS — E785 Hyperlipidemia, unspecified: Secondary | ICD-10-CM | POA: Diagnosis present

## 2016-03-23 DIAGNOSIS — Z79899 Other long term (current) drug therapy: Secondary | ICD-10-CM | POA: Diagnosis not present

## 2016-03-23 LAB — BASIC METABOLIC PANEL
ANION GAP: 10 (ref 5–15)
BUN: 16 mg/dL (ref 6–20)
CALCIUM: 9.6 mg/dL (ref 8.9–10.3)
CHLORIDE: 103 mmol/L (ref 101–111)
CO2: 24 mmol/L (ref 22–32)
CREATININE: 1.54 mg/dL — AB (ref 0.61–1.24)
GFR calc non Af Amer: 43 mL/min — ABNORMAL LOW (ref >60)
GFR, EST AFRICAN AMERICAN: 50 mL/min — AB (ref >60)
Glucose, Bld: 292 mg/dL — ABNORMAL HIGH (ref 65–99)
Potassium: 4.2 mmol/L (ref 3.5–5.1)
SODIUM: 137 mmol/L (ref 135–145)

## 2016-03-23 LAB — URINALYSIS, ROUTINE W REFLEX MICROSCOPIC
Bilirubin Urine: NEGATIVE
Glucose, UA: 500 mg/dL — AB
HGB URINE DIPSTICK: NEGATIVE
Ketones, ur: NEGATIVE mg/dL
LEUKOCYTES UA: NEGATIVE
Nitrite: NEGATIVE
PROTEIN: NEGATIVE mg/dL
SPECIFIC GRAVITY, URINE: 1.03 (ref 1.005–1.030)
pH: 6 (ref 5.0–8.0)

## 2016-03-23 LAB — GLUCOSE, CAPILLARY
GLUCOSE-CAPILLARY: 280 mg/dL — AB (ref 65–99)
Glucose-Capillary: 244 mg/dL — ABNORMAL HIGH (ref 65–99)
Glucose-Capillary: 277 mg/dL — ABNORMAL HIGH (ref 65–99)
Glucose-Capillary: 269 mg/dL — ABNORMAL HIGH (ref 65–99)

## 2016-03-23 LAB — BLOOD CULTURE ID PANEL (REFLEXED)
ACINETOBACTER BAUMANNII: NOT DETECTED
CANDIDA ALBICANS: NOT DETECTED
CANDIDA PARAPSILOSIS: NOT DETECTED
CANDIDA TROPICALIS: NOT DETECTED
Candida glabrata: NOT DETECTED
Candida krusei: NOT DETECTED
ENTEROBACTERIACEAE SPECIES: NOT DETECTED
Enterobacter cloacae complex: NOT DETECTED
Enterococcus species: NOT DETECTED
Escherichia coli: NOT DETECTED
HAEMOPHILUS INFLUENZAE: NOT DETECTED
KLEBSIELLA OXYTOCA: NOT DETECTED
KLEBSIELLA PNEUMONIAE: NOT DETECTED
Listeria monocytogenes: NOT DETECTED
Neisseria meningitidis: NOT DETECTED
PROTEUS SPECIES: NOT DETECTED
Pseudomonas aeruginosa: NOT DETECTED
SERRATIA MARCESCENS: NOT DETECTED
STREPTOCOCCUS SPECIES: NOT DETECTED
Staphylococcus aureus (BCID): NOT DETECTED
Staphylococcus species: NOT DETECTED
Streptococcus agalactiae: NOT DETECTED
Streptococcus pneumoniae: NOT DETECTED
Streptococcus pyogenes: NOT DETECTED

## 2016-03-23 LAB — CBC
HCT: 34.2 % — ABNORMAL LOW (ref 39.0–52.0)
HEMOGLOBIN: 10.7 g/dL — AB (ref 13.0–17.0)
MCH: 29.2 pg (ref 26.0–34.0)
MCHC: 31.3 g/dL (ref 30.0–36.0)
MCV: 93.4 fL (ref 78.0–100.0)
PLATELETS: 272 10*3/uL (ref 150–400)
RBC: 3.66 MIL/uL — AB (ref 4.22–5.81)
RDW: 13.3 % (ref 11.5–15.5)
WBC: 11.1 10*3/uL — AB (ref 4.0–10.5)

## 2016-03-23 LAB — HEMOGLOBIN A1C
Hgb A1c MFr Bld: 7.8 % — ABNORMAL HIGH (ref 4.8–5.6)
Mean Plasma Glucose: 177 mg/dL

## 2016-03-23 MED ORDER — RIVAROXABAN 10 MG PO TABS
10.0000 mg | ORAL_TABLET | Freq: Every day | ORAL | Status: DC
  Administered 2016-03-23: 10 mg via ORAL
  Filled 2016-03-23 (×2): qty 1

## 2016-03-23 MED ORDER — LORAZEPAM 2 MG/ML IJ SOLN
1.0000 mg | Freq: Once | INTRAMUSCULAR | Status: AC
  Administered 2016-03-23: 1 mg via INTRAVENOUS
  Filled 2016-03-23: qty 1

## 2016-03-23 MED ORDER — DEXAMETHASONE SODIUM PHOSPHATE 4 MG/ML IJ SOLN
10.0000 mg | Freq: Once | INTRAMUSCULAR | Status: AC
  Administered 2016-03-23: 10 mg via INTRAVENOUS
  Filled 2016-03-23: qty 3

## 2016-03-23 MED ORDER — VANCOMYCIN HCL IN DEXTROSE 1-5 GM/200ML-% IV SOLN
1000.0000 mg | Freq: Two times a day (BID) | INTRAVENOUS | Status: DC
  Administered 2016-03-23 – 2016-03-24 (×2): 1000 mg via INTRAVENOUS
  Filled 2016-03-23 (×3): qty 200

## 2016-03-23 MED ORDER — CYCLOBENZAPRINE HCL 5 MG PO TABS
5.0000 mg | ORAL_TABLET | Freq: Three times a day (TID) | ORAL | Status: DC
  Administered 2016-03-23 – 2016-03-24 (×4): 5 mg via ORAL
  Filled 2016-03-23 (×4): qty 1

## 2016-03-23 MED ORDER — GADOBENATE DIMEGLUMINE 529 MG/ML IV SOLN
10.0000 mL | Freq: Once | INTRAVENOUS | Status: AC | PRN
  Administered 2016-03-23: 10 mL via INTRAVENOUS

## 2016-03-23 MED ORDER — INSULIN GLARGINE 100 UNIT/ML ~~LOC~~ SOLN
15.0000 [IU] | Freq: Every day | SUBCUTANEOUS | Status: DC
  Administered 2016-03-23 – 2016-03-24 (×2): 15 [IU] via SUBCUTANEOUS
  Filled 2016-03-23 (×2): qty 0.15

## 2016-03-23 MED ORDER — SODIUM CHLORIDE 0.9 % IV SOLN
INTRAVENOUS | Status: AC
Start: 2016-03-23 — End: 2016-03-24
  Administered 2016-03-23: 18:00:00 via INTRAVENOUS

## 2016-03-23 NOTE — Progress Notes (Signed)
PROGRESS NOTE                                                                                                                                                                                                             Patient Demographics:    Brad Day, is a 75 y.o. male, DOB - May 11, 1941, UJW:[id number]  Admit date - 03/22/2016   Admitting Physician Allie Bossier, MD  Outpatient Primary MD for the patient is Orpah Melter, MD  LOS - 0  Chief Complaint  Patient presents with  . Neck Pain       Brief Narrative    Brad Day is a 75 y.o. male with medical history significant for hypertension, diabetes on oral agents, dyslipidemia, chronic kidney disease stage III, recent left knee surgery with spinal anesthesia and on Xarelto for postoperative DVT prophylaxis, and known bilateral carotid artery disease history of prior stroke. Patient reports that last Wednesday he developed what he thought was a "crick in his neck" but the pain was so severe he was unable to turn his head in either direction nor extend his neck backwards or flex his neck forward. He was having chills and developed a very severe headache. He has not had fevers, photophobia or nausea. He has been anorexic and has not been eating as usual. He has Percocet available as well as Robaxin from his recent knee surgery and this has not helped his pain. Because of the pain he's been unable to sleep.     Subjective:    Brad Day today has, No headache, No chest pain, No abdominal pain - No Nausea, No new weakness tingling or numbness, No Cough - SOB. No headache but mild neck pain worse on movement.   Assessment  & Plan :     1.Neck pain. This appears musculoskeletal versus secondary to cervical spondylosis, she does not have any true headache, no fevers, no photophobia, his mentation is good and his symptoms have been ongoing for close to 7-10 days. I do not  think he has meningitis. We'll stop all antibiotics. No LP needed. Will check MRI of C-spine. For now supportive care with muscle relaxants and pain control. His CT head and CT angiogram of the neck were nonacute.  2. Bilateral carotid artery disease. CT angiogram neck unremarkable.  3. Hypertension. Continue ACE inhibitor  and beta blocker.  4. CK D stage III. Baseline creatinine close to 1.4, currently close to baseline we'll monitor.  5. Recent left knee surgery. Resume xaralto for DVT prophylaxis, follow with orthopedics outpatient.  6. DM type II. On sliding scale, and Lantus for better control and monitor.  CBG (last 3)   Recent Labs  03/22/16 2118 03/23/16 0746  GLUCAP 240* 280*      Family Communication  :  wife  Code Status :  Full  Diet : Heart healthy low carbohydrate  Disposition Plan  :  Home 1-2 days  Consults  :  Neurology over the phone  Procedures  :    CT head, CT angiogram neck. Nonacute  MRI C-spine  DVT Prophylaxis  :  Xaralto  Lab Results  Component Value Date   PLT 272 03/23/2016    Inpatient Medications  Scheduled Meds: . atorvastatin  40 mg Oral q1800  . cefTRIAXone (ROCEPHIN)  IV  2 g Intravenous Q12H  . docusate sodium  100 mg Oral BID  . insulin aspart  0-15 Units Subcutaneous TID WC  . insulin aspart  0-5 Units Subcutaneous QHS  . lisinopril  10 mg Oral Daily  . LORazepam  1 mg Intravenous Once  . metoprolol  50 mg Oral BID  . omega-3 acid ethyl esters  1 g Oral BID  . pantoprazole  40 mg Oral Daily  . pioglitazone  15 mg Oral Daily   Continuous Infusions:  PRN Meds:.methocarbamol, ondansetron, oxyCODONE-acetaminophen  Antibiotics  :    Anti-infectives    Start     Dose/Rate Route Frequency Ordered Stop   03/23/16 0800  cefTRIAXone (ROCEPHIN) 2 g in dextrose 5 % 50 mL IVPB     2 g 100 mL/hr over 30 Minutes Intravenous Every 12 hours 03/22/16 1923     03/23/16 0600  vancomycin (VANCOCIN) IVPB 1000 mg/200 mL premix   Status:  Discontinued     1,000 mg 200 mL/hr over 60 Minutes Intravenous Every 12 hours 03/22/16 1637 03/23/16 1126   03/22/16 2100  ampicillin (OMNIPEN) 2 g in sodium chloride 0.9 % 50 mL IVPB  Status:  Discontinued     2 g 150 mL/hr over 20 Minutes Intravenous Every 4 hours 03/22/16 1923 03/23/16 1126   03/22/16 1930  cefTRIAXone (ROCEPHIN) 2 g in dextrose 5 % 50 mL IVPB     2 g 100 mL/hr over 30 Minutes Intravenous NOW 03/22/16 1923 03/22/16 2213   03/22/16 1730  vancomycin (VANCOCIN) 2,000 mg in sodium chloride 0.9 % 500 mL IVPB     2,000 mg 250 mL/hr over 120 Minutes Intravenous  Once 03/22/16 1637 03/22/16 1954   03/22/16 1600  cefTRIAXone (ROCEPHIN) 2 g in dextrose 5 % 50 mL IVPB  Status:  Discontinued     2 g 100 mL/hr over 30 Minutes Intravenous  Once 03/22/16 1556 03/22/16 1924   03/22/16 1600  vancomycin (VANCOCIN) IVPB 1000 mg/200 mL premix  Status:  Discontinued     1,000 mg 200 mL/hr over 60 Minutes Intravenous  Once 03/22/16 1556 03/22/16 1631   03/22/16 1600  ampicillin (OMNIPEN) 2 g in sodium chloride 0.9 % 50 mL IVPB     2 g 150 mL/hr over 20 Minutes Intravenous  Once 03/22/16 1556 03/22/16 1746         Objective:   Vitals:   03/22/16 1904 03/22/16 2121 03/23/16 0522 03/23/16 1036  BP: 140/66 127/68 132/70 (!) 129/54  Pulse: 89 85 71 81  Resp: 18 18 18    Temp: 99 F (37.2 C) 98.7 F (37.1 C) 97.8 F (36.6 C) 98 F (36.7 C)  TempSrc: Oral Oral Oral Oral  SpO2: 98% 95% 97% 96%  Weight: 119 kg (262 lb 6.4 oz)     Height: 5\' 11"  (1.803 m)       Wt Readings from Last 3 Encounters:  03/22/16 119 kg (262 lb 6.4 oz)  02/25/16 123.2 kg (271 lb 9.6 oz)  02/14/16 123.2 kg (271 lb 9.6 oz)     Intake/Output Summary (Last 24 hours) at 03/23/16 1129 Last data filed at 03/23/16 0941  Gross per 24 hour  Intake              510 ml  Output                0 ml  Net              510 ml     Physical Exam  Awake Alert, Oriented X 3, No new F.N deficits, Normal  affect [place].AT,PERRAL Supple Neck,No JVD, No cervical lymphadenopathy appriciated.  Symmetrical Chest wall movement, Good air movement bilaterally, CTAB RRR,No Gallops,Rubs or new Murmurs, No Parasternal Heave +ve B.Sounds, Abd Soft, No tenderness, No organomegaly appriciated, No rebound - guarding or rigidity. No Cyanosis, Clubbing or edema, No new Rash or bruise,Left knee postop site scar stable    Data Review:    CBC  Recent Labs Lab 03/22/16 1046 03/23/16 0851  WBC 14.3* 11.1*  HGB 10.8* 10.7*  HCT 33.1* 34.2*  PLT 308 272  MCV 93.5 93.4  MCH 30.5 29.2  MCHC 32.6 31.3  RDW 13.6 13.3  LYMPHSABS 3.3  --   MONOABS 1.4*  --   EOSABS 0.1  --   BASOSABS 0.0  --     Chemistries   Recent Labs Lab 03/22/16 1046 03/23/16 0851  NA 138 137  K 4.5 4.2  CL 104 103  CO2 25 24  GLUCOSE 188* 292*  BUN 14 16  CREATININE 1.38* 1.54*  CALCIUM 9.8 9.6   ------------------------------------------------------------------------------------------------------------------ No results for input(s): CHOL, HDL, LDLCALC, TRIG, CHOLHDL, LDLDIRECT in the last 72 hours.  Lab Results  Component Value Date   HGBA1C 7.8 (H) 03/22/2016   ------------------------------------------------------------------------------------------------------------------ No results for input(s): TSH, T4TOTAL, T3FREE, THYROIDAB in the last 72 hours.  Invalid input(s): FREET3 ------------------------------------------------------------------------------------------------------------------ No results for input(s): VITAMINB12, FOLATE, FERRITIN, TIBC, IRON, RETICCTPCT in the last 72 hours.  Coagulation profile  Recent Labs Lab 03/22/16 1046  INR 1.21    No results for input(s): DDIMER in the last 72 hours.  Cardiac Enzymes No results for input(s): CKMB, TROPONINI, MYOGLOBIN in the last 168 hours.  Invalid input(s):  CK ------------------------------------------------------------------------------------------------------------------ No results found for: BNP  Micro Results No results found for this or any previous visit (from the past 240 hour(s)).  Radiology Reports Dg Chest 2 View  Result Date: 03/22/2016 CLINICAL DATA:  Headache and neck stiffness 5 days. Elevated white blood cell count. Knee surgery 4 weeks ago. EXAM: CHEST  2 VIEW COMPARISON:  09/07/2009 and 03/16/2006 FINDINGS: Sternotomy wires unchanged. Lungs are adequately inflated without consolidation or effusion. Cardiomediastinal silhouette and remainder of the exam is unchanged. IMPRESSION: No active cardiopulmonary disease. Electronically Signed   By: Marin Olp M.D.   On: 03/22/2016 18:39   Dg Knee 1-2 Views Left  Result Date: 02/25/2016 CLINICAL DATA:  Status post total left knee arthroplasty. EXAM: LEFT KNEE - 1-2 VIEW COMPARISON:  None. FINDINGS: Has been in 3 component total left knee arthroplasty with anatomic alignment of the orthopedic hardware and no evidence of fracture. Expected postsurgical soft tissue edema and emphysema are seen. IMPRESSION: Post 3 component total left knee arthroplasty without evidence of immediate complications. Electronically Signed   By: Fidela Salisbury M.D.   On: 02/25/2016 15:12   Ct Head Wo Contrast  Result Date: 03/22/2016 CLINICAL DATA:  Neck stiffness and headache for several days. EXAM: CT HEAD WITHOUT CONTRAST TECHNIQUE: Contiguous axial images were obtained from the base of the skull through the vertex without intravenous contrast. COMPARISON:  CTA neck reported separately.  CT head 02/05/2006. FINDINGS: Brain: No evidence of acute infarction, hemorrhage, hydrocephalus, extra-axial collection or mass lesion/mass effect. Mild atrophy. Hypoattenuation of white matter suggesting chronic microvascular ischemic change. Remote LEFT frontal parasagittal ACA territory infarct. Vascular: Extensive  vascular calcification in the skull base internal carotid arteries, greater on the LEFT. No hyperdense MCA sign. Skull: Normal. Negative for fracture or focal lesion. Sinuses/Orbits: No acute finding. Other: None. Compared with prior imaging studies, the LEFT frontal infarct was acute in 2007 IMPRESSION: Chronic changes as described.  No acute intracranial findings. Chronic LEFT ACA territory frontal lobe infarct. Electronically Signed   By: Staci Righter M.D.   On: 03/22/2016 14:58   Ct Angio Neck W And/or Wo Contrast  Result Date: 03/22/2016 CLINICAL DATA:  Neck pain and head pain neck started 4-5 days ago with neck stiffness. EXAM: CT ANGIOGRAPHY NECK TECHNIQUE: Multidetector CT imaging of the neck was performed using the standard protocol during bolus administration of intravenous contrast. Multiplanar CT image reconstructions and MIPs were obtained to evaluate the vascular anatomy. Carotid stenosis measurements (when applicable) are obtained utilizing NASCET criteria, using the distal internal carotid diameter as the denominator. CONTRAST:  Isovue 370, 50 mL. COMPARISON:  None FINDINGS: Aortic arch: Standard branching. Imaged portion shows no evidence of aneurysm or dissection. No significant stenosis of the major arch vessel origins. Minor atheromatous change transverse arch. Right carotid system: Moderate circumferential soft plaque in the proximal LEFT ICA, non stenotic based on luminal measurements of 3.1/4.1. No dissection. Left carotid system: Minor atheromatous change at the bifurcation. Post endarterectomy appearance, without significant cuff defect. No evidence of dissection, stenosis (50% or greater) or occlusion. Vertebral arteries: LEFT dominant but both are patent. No evidence of dissection, stenosis (50% or greater) or occlusion. Skeletal:  No fracture or malalignment.  Moderate spondylosis. Other neck: No neck masses. Upper chest: Lung apices clear.  Prior CABG. IMPRESSION: Post  endarterectomy changes, LEFT ICA. No recurrent stenosis or dissection. Minor atheromatous change RIGHT carotid bifurcation without flow limiting stenosis or dissection. No vertebral artery disease of significance. Cervical spondylosis.  No fracture. Electronically Signed   By: Staci Righter M.D.   On: 03/22/2016 15:02    Time Spent in minutes  30   Shimika Ames K M.D on 03/23/2016 at 11:29 AM  Between 7am to 7pm - Pager - [phone]  After 7pm go to [email or web address] - password [email or web address]  [facility] -  Office  [phone]

## 2016-03-23 NOTE — Progress Notes (Signed)
Pharmacy Antibiotic Note  Brad Day is a 75 y.o. male admitted on 03/22/2016 with complaints of neck pain and head pain that started on Wednesday with some neck stiffness. Patient denies injury or fever. Pharmacy was consulted for vancomycin dosing for suspected meningitis but then it was discontinued; now resuming for suspected bacteremia.  SCr increased to 1.54 so will need to watch closely.   Plan: Resume vancomycin 1000 mg IV q12h F/u culture data and clinical progress Vancomycin trough as clinically indicated  Height: 5\' 11"  (180.3 cm) Weight: 262 lb 6.4 oz (119 kg) IBW/kg (Calculated) : 75.3  Temp (24hrs), Avg:98.2 F (36.8 C), Min:97.8 F (36.6 C), Max:98.7 F (37.1 C)   Recent Labs Lab 03/22/16 1046 03/23/16 0851  WBC 14.3* 11.1*  CREATININE 1.38* 1.54*    Estimated Creatinine Clearance: 55.2 mL/min (by C-G formula based on SCr of 1.54 mg/dL (H)).    Allergies  Allergen Reactions  . Asa [Aspirin] Other (See Comments)    Causes bleeding Ulcer   Past Medical History:  Diagnosis Date  . Arthritis   . Carotid artery occlusion   . Coronary artery disease   . Diabetes mellitus without complication ([place])   . Fall down embankment Oct. 26, 2016   Hurt Right shoulder  . History of bleeding ulcers   . Hypertension   . Shortness of breath dyspnea    with exertion  . Stroke [facility])    4 mini strokes     Antimicrobials this admission:  Vancomycin 9/17>>  Ampicillin 9/17 x1 Ceftriaxone 9/17>9/18   Dose adjustments this admission: n/a   Microbiology results: 9/17 BCx: 1/2 G[facility]   BCID: nothing detected   Thank you for allowing pharmacy to be a part of this patient's care.  Renold Genta, PharmD, BCPS Clinical Pharmacist Phone for today - [place] - [phone] 03/23/2016 7:11 PM

## 2016-03-23 NOTE — Care Management Obs Status (Signed)
[facility] NOTIFICATION   Patient Details  Name: Brad Day MRN: [id number] Date of Birth: 08-08-1940   Medicare Observation Status Notification Given:  Yes    Sharin Mons, RN 03/23/2016, 3:24 PM

## 2016-03-23 NOTE — Progress Notes (Signed)
Orthopedic Tech Progress Note Patient Details:  Brad Day 01/23/1941 [id number]  Ortho Devices Type of Ortho Device: Soft collar Ortho Device/Splint Interventions: Application   [name] 03/23/2016, 6:27 PM

## 2016-03-23 NOTE — Progress Notes (Signed)
Xarelto per Pharmacy for VTE prophylaxis   75 YOM s/p L TKA on 8/22, was discharged on xarelto 10 mg daily after surgery. Now readmitted with neck pain. CT head and neck no acute findings. Pharmacy is consulted to restart xarelto. Last PTA dose was on 9/16, crcl ~ 55 ml/min  Plan: Xarelto 10 mg po daily with dinner Pt needs to F/u with ortho on LOT.  Pharmacy sign off.  Thank you!  Maryanna Shape, PharmD, BCPS  Clinical Pharmacist  Pager: [phone]

## 2016-03-23 NOTE — Progress Notes (Signed)
PHARMACY - PHYSICIAN COMMUNICATION CRITICAL VALUE ALERT - BLOOD CULTURE IDENTIFICATION (BCID)  Results for orders placed or performed during the hospital encounter of 03/22/16  Blood Culture ID Panel (Reflexed) (Collected: 03/22/2016  4:37 PM)  Result Value Ref Range   Enterococcus species NOT DETECTED NOT DETECTED   Listeria monocytogenes NOT DETECTED NOT DETECTED   Staphylococcus species NOT DETECTED NOT DETECTED   Staphylococcus aureus NOT DETECTED NOT DETECTED   Streptococcus species NOT DETECTED NOT DETECTED   Streptococcus agalactiae NOT DETECTED NOT DETECTED   Streptococcus pneumoniae NOT DETECTED NOT DETECTED   Streptococcus pyogenes NOT DETECTED NOT DETECTED   Acinetobacter baumannii NOT DETECTED NOT DETECTED   Enterobacteriaceae species NOT DETECTED NOT DETECTED   Enterobacter cloacae complex NOT DETECTED NOT DETECTED   Escherichia coli NOT DETECTED NOT DETECTED   Klebsiella oxytoca NOT DETECTED NOT DETECTED   Klebsiella pneumoniae NOT DETECTED NOT DETECTED   Proteus species NOT DETECTED NOT DETECTED   Serratia marcescens NOT DETECTED NOT DETECTED   Haemophilus influenzae NOT DETECTED NOT DETECTED   Neisseria meningitidis NOT DETECTED NOT DETECTED   Pseudomonas aeruginosa NOT DETECTED NOT DETECTED   Candida albicans NOT DETECTED NOT DETECTED   Candida glabrata NOT DETECTED NOT DETECTED   Candida krusei NOT DETECTED NOT DETECTED   Candida parapsilosis NOT DETECTED NOT DETECTED   Candida tropicalis NOT DETECTED NOT DETECTED    Name of physician (or Provider) Contacted: text paged Dr. Candiss Norse  Changes to prescribed antibiotics required: BCID with no results which could represent contaminant. Dr. Candiss Norse would like to resume vancomycin until identification back.  Renold Genta Danielle 03/23/2016  7:05 PM

## 2016-03-23 NOTE — Plan of Care (Signed)
Problem: Education: Goal: Knowledge of [facility] Education information/materials will improve Outcome: Progressing POC reviewed with pt. and wife.

## 2016-03-24 LAB — BASIC METABOLIC PANEL
Anion gap: 10 (ref 5–15)
BUN: 21 mg/dL — AB (ref 6–20)
CALCIUM: 9.4 mg/dL (ref 8.9–10.3)
CO2: 24 mmol/L (ref 22–32)
CREATININE: 1.57 mg/dL — AB (ref 0.61–1.24)
Chloride: 103 mmol/L (ref 101–111)
GFR calc Af Amer: 48 mL/min — ABNORMAL LOW (ref >60)
GFR, EST NON AFRICAN AMERICAN: 42 mL/min — AB (ref >60)
Glucose, Bld: 293 mg/dL — ABNORMAL HIGH (ref 65–99)
POTASSIUM: 4.3 mmol/L (ref 3.5–5.1)
SODIUM: 137 mmol/L (ref 135–145)

## 2016-03-24 LAB — GLUCOSE, CAPILLARY: GLUCOSE-CAPILLARY: 289 mg/dL — AB (ref 65–99)

## 2016-03-24 MED ORDER — CYCLOBENZAPRINE HCL 5 MG PO TABS: 5.0000 mg | ORAL_TABLET | Freq: Three times a day (TID) | ORAL | 0 refills | Status: DC | PRN

## 2016-03-24 MED ORDER — CEFPODOXIME PROXETIL 200 MG PO TABS: 200.0000 mg | ORAL_TABLET | Freq: Two times a day (BID) | ORAL | 0 refills | Status: DC

## 2016-03-24 MED ORDER — METHYLPREDNISOLONE 4 MG PO TBPK: ORAL_TABLET | ORAL | 0 refills | Status: DC

## 2016-03-24 MED ORDER — METFORMIN HCL 500 MG PO TABS: 500.0000 mg | ORAL_TABLET | Freq: Two times a day (BID) | ORAL | Status: AC

## 2016-03-24 MED ORDER — CEFPODOXIME PROXETIL 200 MG PO TABS
200.0000 mg | ORAL_TABLET | Freq: Two times a day (BID) | ORAL | Status: DC
  Filled 2016-03-24: qty 1

## 2016-03-24 NOTE — Discharge Summary (Signed)
Brad Day:[id number] DOB: 03-12-1941 DOA: 03/22/2016  PCP: Brad Melter, MD  Admit date: 03/22/2016  Discharge date: 03/24/2016  Admitted From: Home   Disposition:  Home   Recommendations for Outpatient Follow-up:   Follow up with PCP in 1-2 weeks  PCP Please obtain BMP/CBC, 2 view CXR in 1week,  (see Discharge instructions)   PCP Please follow up on the following pending results: Follow Final Blood culture results   Home Health: None Equipment/Devices: None  Consultations: Phone consult - ID & N.Surg Discharge Condition: Stable CODE STATUS: Full   Diet Recommendation: Heart Healthy- Low Carb   Chief Complaint  Patient presents with  . Neck Pain     Brief history of present illness from the day of admission and additional interim summary    Brad W Simmonsis a 75 y.o.malewith medical history significant for hypertension, diabetes on oral agents, dyslipidemia, chronic kidney disease stage III, recent left knee surgery with spinal anesthesia and on Xareltofor postoperative DVT prophylaxis, and known bilateral carotid artery disease history of prior stroke. Patient reports that last Wednesday he developed what he thought was a "crick in his neck" but the pain was so severe he was unable to turn his head in either direction nor extend his neck backwards or flex his neck forward.   He was having ?? chills and developed a very severe headache. He has not had fevers, no photophobia or nausea. Further history indicated that he actually never had headache but had neck pain mostly related to lateral neck movement, his neck flexion and extension were intact, Kernig's and Brudenzki's sign were negative.  Hospital issues addressed     1.Neck pain. This appears musculoskeletal versus secondary to cervical  spondylosis, His flexion and extension of the neck are well preserved, he has pain on lateral neck movement only, he clearly stated that he had no true headaches, no fevers or photophobia. He has had symptoms at least 7-10 days prior to ER visit which clinically rules out CNS infection. MRI C-spine was done which confirmed multilevel DJD and central disc protrusion, his case was discussed by me with both ID physician Brad Day and neurosurgeon Brad Day. Plan is to place him on steroids, muscle relaxants, soft cervical collar along with pain control.  With 24 hours of above therapy he is much better, he says he is about 50-70% better and eager to go home, again he has no headache, no photophobia, no fever or chills, kindly see #5 below for blood cultures. Post discharge she will follow with PCP along with neurosurgery.   2. Bilateral carotid artery disease. CT angiogram neck unremarkable.  3. Hypertension. Continue ACE inhibitor and beta blocker.  4. CK D stage III. Baseline creatinine close to 1.4, currently close to baseline we'll monitor.  5. Recent left knee surgery. Resume xaralto for DVT prophylaxis, follow with orthopedics outpatient.  6. One out of 4 bottles of blood culture positive for Moraxella. No ear discomfort, no eye discomfort, minimal cough could have had a URI, discussed  his case in detail with ID physician Brad Day, 10 day trial of Vantin, patient again afebrile, no headache, no other subjective complaints except some neck pain on lateral neck movement after a crack in the neck 10-12 days ago.   7. DM type II. Continue home regimen with the Glucophage to be started on 03/25/2016.   Discharge diagnosis     Principal Problem:   Meningitis Active Problems:   Bilateral carotid artery disease (HCC)   Headache   Diabetes mellitus type 2, controlled (HCC)   HTN (hypertension)   HLD (hyperlipidemia)   CKD (chronic kidney disease) stage 3, GFR 30-59 ml/min   History  of left knee surgery   Cephalalgia   Controlled diabetes mellitus type 2 with complications ([place])   Neck pain    Discharge instructions    Discharge Instructions    Discharge instructions    Complete by:  As directed    Follow with Primary MD Brad Melter, MD in 7 days   Get CBC, CMP, 2 view Chest X ray checked  by Primary MD or SNF MD in 5-7 days ( we routinely change or add medications that can affect your baseline labs and fluid status, therefore we recommend that you get the mentioned basic workup next visit with your PCP, your PCP may decide not to get them or add new tests based on their clinical decision)   Activity: As tolerated with Full fall precautions use walker/cane & assistance as needed   Disposition Home     Diet:   Heart Healthy Low Carb.  For Heart failure patients - Check your Weight same time everyday, if you gain over 2 pounds, or you develop in leg swelling, experience more shortness of breath or chest pain, call your Primary MD immediately. Follow Cardiac Low Salt Diet and 1.5 lit/day fluid restriction.   On your next visit with your primary care physician please Get Medicines reviewed and adjusted.   Please request your Prim.MD to go over all Hospital Tests and Procedure/Radiological results at the follow up, please get all Hospital records sent to your Prim MD by signing hospital release before you go home.   If you experience worsening of your admission symptoms, develop shortness of breath, life threatening emergency, suicidal or homicidal thoughts you must seek medical attention immediately by calling 911 or calling your MD immediately  if symptoms less severe.  You Must read complete instructions/literature along with all the possible adverse reactions/side effects for all the Medicines you take and that have been prescribed to you. Take any new Medicines after you have completely understood and accpet all the possible adverse reactions/side  effects.   Do not drive, operate heavy machinery, perform activities at heights, swimming or participation in water activities or provide baby sitting services if your were admitted for syncope or siezures until you have seen by Primary MD or a Neurologist and advised to do so again.  Do not drive when taking Pain medications.    Do not take more than prescribed Pain, Sleep and Anxiety Medications  Special Instructions: If you have smoked or chewed Tobacco  in the last 2 yrs please stop smoking, stop any regular Alcohol  and or any Recreational drug use.  Wear Seat belts while driving.   Please note  You were cared for by a hospitalist during your hospital stay. If you have any questions about your discharge medications or the care you received while you were in the hospital after you are discharged,  you can call the unit and asked to speak with the hospitalist on call if the hospitalist that took care of you is not available. Once you are discharged, your primary care physician will handle any further medical issues. Please note that NO REFILLS for any discharge medications will be authorized once you are discharged, as it is imperative that you return to your primary care physician (or establish a relationship with a primary care physician if you do not have one) for your aftercare needs so that they can reassess your need for medications and monitor your lab values.   Increase activity slowly    Complete by:  As directed       Discharge Medications     Medication List    STOP taking these medications   methocarbamol 500 MG tablet Commonly known as:  ROBAXIN     TAKE these medications   atorvastatin 40 MG tablet Commonly known as:  LIPITOR Take 40 mg by mouth daily.   cefpodoxime 200 MG tablet Commonly known as:  VANTIN Take 1 tablet (200 mg total) by mouth every 12 (twelve) hours.   cyclobenzaprine 5 MG tablet Commonly known as:  FLEXERIL Take 1 tablet (5 mg total) by  mouth 3 (three) times daily as needed for muscle spasms.   docusate sodium 100 MG capsule Commonly known as:  COLACE Take 1 capsule (100 mg total) by mouth 2 (two) times daily. To prevent constipation while taking pain medication. What changed:  when to take this  additional instructions   Fish Oil 1200 MG Caps Take 1,200 mg by mouth 2 (two) times daily.   lisinopril 10 MG tablet Commonly known as:  PRINIVIL,ZESTRIL Take 10 mg by mouth at bedtime.   metFORMIN 500 MG tablet Commonly known as:  GLUCOPHAGE Take 1 tablet (500 mg total) by mouth 2 (two) times daily with a meal.   methylPREDNISolone 4 MG Tbpk tablet Commonly known as:  MEDROL DOSEPAK follow package directions   metoprolol 50 MG tablet Commonly known as:  LOPRESSOR Take 50 mg by mouth 2 (two) times daily.   ondansetron 4 MG tablet Commonly known as:  ZOFRAN Take 1 tablet (4 mg total) by mouth every 8 (eight) hours as needed for nausea or vomiting.   oxyCODONE-acetaminophen 5-325 MG tablet Commonly known as:  PERCOCET/ROXICET Take 1 tablet by mouth every 4 (four) hours as needed for severe pain. What changed:  Another medication with the same name was removed. Continue taking this medication, and follow the directions you see here.   pantoprazole 40 MG tablet Commonly known as:  PROTONIX Take 40 mg by mouth daily.   pioglitazone 15 MG tablet Commonly known as:  ACTOS Take 15 mg by mouth at bedtime.   rivaroxaban 10 MG Tabs tablet Commonly known as:  XARELTO Take 1 tablet (10 mg total) by mouth daily. What changed:  when to take this       Follow-up Information    Brad Melter, MD. Schedule an appointment as soon as possible for a visit on 03/31/2016.   Specialty:  Family Medicine Why:  Appointment will be with MD PA 03/31/16 at 08:30am Contact information: [address] [place] [address] [phone]        Charlie Pitter, MD. Schedule an appointment as soon as possible for a  visit on 03/26/2016.   Specialty:  Neurosurgery Why:  Appointment with Dr. Annette Stable is on 03/26/16 at 2:15pm Contact information: [address]  [place] [address] [phone]        Renette Butters, MD. Schedule an appointment as soon as possible for a visit in 1 week(s).   Specialty:  Orthopedic Surgery Contact information: [address] [place] [id number] [phone]           Major procedures and Radiology Reports - PLEASE review detailed and final reports thoroughly  -        Dg Chest 2 View  Result Date: 03/22/2016 CLINICAL DATA:  Headache and neck stiffness 5 days. Elevated white blood cell count. Knee surgery 4 weeks ago. EXAM: CHEST  2 VIEW COMPARISON:  09/07/2009 and 03/16/2006 FINDINGS: Sternotomy wires unchanged. Lungs are adequately inflated without consolidation or effusion. Cardiomediastinal silhouette and remainder of the exam is unchanged. IMPRESSION: No active cardiopulmonary disease. Electronically Signed   By: Marin Olp M.D.   On: 03/22/2016 18:39   Dg Knee 1-2 Views Left  Result Date: 02/25/2016 CLINICAL DATA:  Status post total left knee arthroplasty. EXAM: LEFT KNEE - 1-2 VIEW COMPARISON:  None. FINDINGS: Has been in 3 component total left knee arthroplasty with anatomic alignment of the orthopedic hardware and no evidence of fracture. Expected postsurgical soft tissue edema and emphysema are seen. IMPRESSION: Post 3 component total left knee arthroplasty without evidence of immediate complications. Electronically Signed   By: Fidela Salisbury M.D.   On: 02/25/2016 15:12   Ct Head Wo Contrast  Result Date: 03/22/2016 CLINICAL DATA:  Neck stiffness and headache for several days. EXAM: CT HEAD WITHOUT CONTRAST TECHNIQUE: Contiguous axial images were obtained from the base of the skull through the vertex without intravenous contrast. COMPARISON:  CTA neck reported separately.  CT head 02/05/2006. FINDINGS: Brain: No  evidence of acute infarction, hemorrhage, hydrocephalus, extra-axial collection or mass lesion/mass effect. Mild atrophy. Hypoattenuation of white matter suggesting chronic microvascular ischemic change. Remote LEFT frontal parasagittal ACA territory infarct. Vascular: Extensive vascular calcification in the skull base internal carotid arteries, greater on the LEFT. No hyperdense MCA sign. Skull: Normal. Negative for fracture or focal lesion. Sinuses/Orbits: No acute finding. Other: None. Compared with prior imaging studies, the LEFT frontal infarct was acute in 2007 IMPRESSION: Chronic changes as described.  No acute intracranial findings. Chronic LEFT ACA territory frontal lobe infarct. Electronically Signed   By: Staci Righter M.D.   On: 03/22/2016 14:58   Ct Angio Neck W And/or Wo Contrast  Result Date: 03/22/2016 CLINICAL DATA:  Neck pain and head pain neck started 4-5 days ago with neck stiffness. EXAM: CT ANGIOGRAPHY NECK TECHNIQUE: Multidetector CT imaging of the neck was performed using the standard protocol during bolus administration of intravenous contrast. Multiplanar CT image reconstructions and MIPs were obtained to evaluate the vascular anatomy. Carotid stenosis measurements (when applicable) are obtained utilizing NASCET criteria, using the distal internal carotid diameter as the denominator. CONTRAST:  Isovue 370, 50 mL. COMPARISON:  None FINDINGS: Aortic arch: Standard branching. Imaged portion shows no evidence of aneurysm or dissection. No significant stenosis of the major arch vessel origins. Minor atheromatous change transverse arch. Right carotid system: Moderate circumferential soft plaque in the proximal LEFT ICA, non stenotic based on luminal measurements of 3.1/4.1. No dissection. Left carotid system: Minor atheromatous change at the bifurcation. Post endarterectomy appearance, without significant cuff defect. No evidence of dissection, stenosis (50% or greater) or occlusion.  Vertebral arteries: LEFT dominant but both are patent. No evidence of dissection, stenosis (50% or greater) or occlusion. Skeletal:  No fracture or malalignment.  Moderate spondylosis. Other neck: No neck masses. Upper chest: Lung apices clear.  Prior CABG. IMPRESSION: Post endarterectomy changes, LEFT ICA. No recurrent stenosis or dissection. Minor atheromatous change RIGHT carotid bifurcation without flow limiting stenosis or dissection. No vertebral artery disease of significance. Cervical spondylosis.  No fracture. Electronically Signed   By: Staci Righter M.D.   On: 03/22/2016 15:02   Mr Cervical Spine W Wo Contrast  Result Date: 03/23/2016 CLINICAL DATA:  Neck pain EXAM: MRI CERVICAL SPINE WITHOUT AND WITH CONTRAST TECHNIQUE: Multiplanar and multiecho pulse sequences of the cervical spine, to include the craniocervical junction and cervicothoracic junction, were obtained according to standard protocol without and with intravenous contrast. CONTRAST:  76mL MULTIHANCE GADOBENATE DIMEGLUMINE 529 MG/ML IV SOLN COMPARISON:  CTA neck 03/22/2016 FINDINGS: Image quality degraded by mild motion. There is also artifact from dental hardware which degrades image quality most notably on the postcontrast fat suppressed images. Normal alignment with straightening of the cervical lordosis. Negative for fracture or mass Spinal cord signal normal.  No cord lesion. Postcontrast imaging limited by artifact however no abnormal enhancement identified. Brainstem and posterior fossa within normal limits. C2-3:  Negative C3-4:  Mild disc degeneration and disc bulging. C4-5: Disc bulging and spurring. Central disc protrusion flattening the cord with mild to moderate spinal stenosis. C5-6: Disc degeneration and spondylosis right greater than left. Moderate right foraminal encroachment due to spurring. Mild spinal stenosis C6-7: Mild disc degeneration and spurring. Mild foraminal narrowing bilaterally C7-T1:  Negative IMPRESSION:  Multifocal cervical spondylosis.  Negative for fracture or mass Central disc protrusion with mild-to-moderate spinal stenosis C4-5 Moderate right foraminal encroachment due to spurring and disc material. Mild spinal stenosis at C5-6. Electronically Signed   By: Franchot Gallo M.D.   On: 03/23/2016 13:54    Micro Results    Recent Results (from the past 240 hour(s))  Blood culture (routine x 2)     Status: Abnormal   Collection Time: 03/22/16  4:37 PM  Result Value Ref Range Status   Specimen Description BLOOD RIGHT ANTECUBITAL  Final   Special Requests BOTTLES DRAWN AEROBIC AND ANAEROBIC 5CC  Final   Culture  Setup Time   Final    GRAM NEGATIVE COCCI AEROBIC BOTTLE ONLY CRITICAL RESULT CALLED TO, READ BACK BY AND VERIFIED WITH: J. [place], PHARMD AT 1900 ON 03/23/16 BY C. JESSUP, MLT. PREVIOUSLY REPORTED AS GRAM POSITIVE COCCI THIS ORGANISM IS KNOWN TO STAIN VARIABLY CALLED N. BATCHELDER PHARMD, AT [phone] 03/24/16 ABOUT CORRECTION TO THE REPORT D. VANHOOK    Culture (A)  Final    MORAXELLA CATARRHALIS(BRANHAMELLA) BETA LACTAMASE POSITIVE    Report Status 03/24/2016 FINAL  Final  Blood Culture ID Panel (Reflexed)     Status: None   Collection Time: 03/22/16  4:37 PM  Result Value Ref Range Status   Enterococcus species NOT DETECTED NOT DETECTED Final   Listeria monocytogenes NOT DETECTED NOT DETECTED Final   Staphylococcus species NOT DETECTED NOT DETECTED Final   Staphylococcus aureus NOT DETECTED NOT DETECTED Final   Streptococcus species NOT DETECTED NOT DETECTED Final   Streptococcus agalactiae NOT DETECTED NOT DETECTED Final   Streptococcus pneumoniae NOT DETECTED NOT DETECTED Final   Streptococcus pyogenes NOT DETECTED NOT DETECTED Final   Acinetobacter baumannii NOT DETECTED NOT DETECTED Final   Enterobacteriaceae species NOT DETECTED NOT DETECTED Final   Enterobacter cloacae complex NOT DETECTED NOT DETECTED Final   Escherichia coli NOT DETECTED NOT DETECTED Final    Klebsiella oxytoca NOT DETECTED NOT DETECTED  Final   Klebsiella pneumoniae NOT DETECTED NOT DETECTED Final   Proteus species NOT DETECTED NOT DETECTED Final   Serratia marcescens NOT DETECTED NOT DETECTED Final   Haemophilus influenzae NOT DETECTED NOT DETECTED Final   Neisseria meningitidis NOT DETECTED NOT DETECTED Final   Pseudomonas aeruginosa NOT DETECTED NOT DETECTED Final   Candida albicans NOT DETECTED NOT DETECTED Final   Candida glabrata NOT DETECTED NOT DETECTED Final   Candida krusei NOT DETECTED NOT DETECTED Final   Candida parapsilosis NOT DETECTED NOT DETECTED Final   Candida tropicalis NOT DETECTED NOT DETECTED Final  Blood culture (routine x 2)     Status: None (Preliminary result)   Collection Time: 03/22/16  4:47 PM  Result Value Ref Range Status   Specimen Description BLOOD RIGHT ANTECUBITAL  Final   Special Requests BOTTLES DRAWN AEROBIC ONLY 5CC  Final   Culture NO GROWTH < 24 HOURS  Final   Report Status PENDING  Incomplete    Today   Subjective    Brad Day today has no headache,no chest abdominal pain,no new weakness tingling or numbness, feels much better wants to go home today.    Objective   Blood pressure 123/84, pulse 79, temperature 98.6 F (37 C), temperature source Oral, resp. rate 17, height 5\' 11"  (1.803 m), weight 119 kg (262 lb 6.4 oz), SpO2 95 %.   Intake/Output Summary (Last 24 hours) at 03/24/16 0957 Last data filed at 03/24/16 0100  Gross per 24 hour  Intake           686.67 ml  Output                4 ml  Net           682.67 ml    Exam Awake Alert, Oriented x 3, No new F.N deficits, Normal affect [place].AT,PERRAL Supple Neck In terms of flexion and extension, lateral leg movements are somewhat tender but much improved than yesterday, Kernig's and Brudenzki's sign were negative,No JVD, No cervical lymphadenopathy appriciated.  Symmetrical Chest wall movement, Good air movement bilaterally, CTAB RRR,No Gallops,Rubs or new Murmurs,  No Parasternal Heave +ve B.Sounds, Abd Soft, Non tender, No organomegaly appriciated, No rebound -guarding or rigidity. No Cyanosis, Clubbing or edema, No new Rash or bruise, left knee postop scar stable.   Data Review   CBC w Diff:  Lab Results  Component Value Date   WBC 11.1 (H) 03/23/2016   HGB 10.7 (L) 03/23/2016   HCT 34.2 (L) 03/23/2016   PLT 272 03/23/2016   LYMPHOPCT 23 03/22/2016   BANDSPCT 3 03/22/2016   MONOPCT 10 03/22/2016   EOSPCT 1 03/22/2016   BASOPCT 0 03/22/2016    CMP:  Lab Results  Component Value Date   NA 137 03/24/2016   K 4.3 03/24/2016   CL 103 03/24/2016   CO2 24 03/24/2016   BUN 21 (H) 03/24/2016   CREATININE 1.57 (H) 03/24/2016   PROT 5.9 (L) 09/07/2009   ALBUMIN 3.1 (L) 09/07/2009   BILITOT 0.5 09/07/2009   ALKPHOS 49 09/07/2009   AST 14 09/07/2009   ALT 17 09/07/2009  .   Total Time in preparing paper work, data evaluation and todays exam - 35 minutes  Thurnell Lose M.D on 03/24/2016 at 9:57 AM  [facility]   Office  [phone]

## 2016-03-24 NOTE — Progress Notes (Signed)
[place] discharged Home per MD order.  Discharge instructions reviewed and discussed with the patient, all questions and concerns answered. Copy of instructions and scripts given to patient.    Medication List    STOP taking these medications   methocarbamol 500 MG tablet Commonly known as:  ROBAXIN     TAKE these medications   atorvastatin 40 MG tablet Commonly known as:  LIPITOR Take 40 mg by mouth daily.   cefpodoxime 200 MG tablet Commonly known as:  VANTIN Take 1 tablet (200 mg total) by mouth every 12 (twelve) hours.   cyclobenzaprine 5 MG tablet Commonly known as:  FLEXERIL Take 1 tablet (5 mg total) by mouth 3 (three) times daily as needed for muscle spasms.   docusate sodium 100 MG capsule Commonly known as:  COLACE Take 1 capsule (100 mg total) by mouth 2 (two) times daily. To prevent constipation while taking pain medication. What changed:  when to take this  additional instructions   Fish Oil 1200 MG Caps Take 1,200 mg by mouth 2 (two) times daily.   lisinopril 10 MG tablet Commonly known as:  PRINIVIL,ZESTRIL Take 10 mg by mouth at bedtime.   metFORMIN 500 MG tablet Commonly known as:  GLUCOPHAGE Take 1 tablet (500 mg total) by mouth 2 (two) times daily with a meal.   methylPREDNISolone 4 MG Tbpk tablet Commonly known as:  MEDROL DOSEPAK follow package directions   metoprolol 50 MG tablet Commonly known as:  LOPRESSOR Take 50 mg by mouth 2 (two) times daily.   ondansetron 4 MG tablet Commonly known as:  ZOFRAN Take 1 tablet (4 mg total) by mouth every 8 (eight) hours as needed for nausea or vomiting.   oxyCODONE-acetaminophen 5-325 MG tablet Commonly known as:  PERCOCET/ROXICET Take 1 tablet by mouth every 4 (four) hours as needed for severe pain. What changed:  Another medication with the same name was removed. Continue taking this medication, and follow the directions you see here.   pantoprazole 40 MG tablet Commonly known as:   PROTONIX Take 40 mg by mouth daily.   pioglitazone 15 MG tablet Commonly known as:  ACTOS Take 15 mg by mouth at bedtime.   rivaroxaban 10 MG Tabs tablet Commonly known as:  XARELTO Take 1 tablet (10 mg total) by mouth daily. What changed:  when to take this       Patients skin is clean, dry and intact, no evidence of skin break down. IV site discontinued and catheter remains intact. Site without signs and symptoms of complications. Dressing and pressure applied.  Patient escorted to car by NT in a wheelchair,  no distress noted upon discharge.  Kier Smead C 03/24/2016 12:00 PM

## 2016-03-24 NOTE — Discharge Instructions (Signed)
Follow with Primary MD Orpah Melter, MD in 7 days   Get CBC, CMP, 2 view Chest X ray checked  by Primary MD or SNF MD in 5-7 days ( we routinely change or add medications that can affect your baseline labs and fluid status, therefore we recommend that you get the mentioned basic workup next visit with your PCP, your PCP may decide not to get them or add new tests based on their clinical decision)   Activity: As tolerated with Full fall precautions use walker/cane & assistance as needed   Disposition Home     Diet:   Heart Healthy Low Carb.  For Heart failure patients - Check your Weight same time everyday, if you gain over 2 pounds, or you develop in leg swelling, experience more shortness of breath or chest pain, call your Primary MD immediately. Follow Cardiac Low Salt Diet and 1.5 lit/day fluid restriction.   On your next visit with your primary care physician please Get Medicines reviewed and adjusted.   Please request your Prim.MD to go over all Hospital Tests and Procedure/Radiological results at the follow up, please get all Hospital records sent to your Prim MD by signing hospital release before you go home.   If you experience worsening of your admission symptoms, develop shortness of breath, life threatening emergency, suicidal or homicidal thoughts you must seek medical attention immediately by calling 911 or calling your MD immediately  if symptoms less severe.  You Must read complete instructions/literature along with all the possible adverse reactions/side effects for all the Medicines you take and that have been prescribed to you. Take any new Medicines after you have completely understood and accpet all the possible adverse reactions/side effects.   Do not drive, operate heavy machinery, perform activities at heights, swimming or participation in water activities or provide baby sitting services if your were admitted for syncope or siezures until you have seen by Primary  MD or a Neurologist and advised to do so again.  Do not drive when taking Pain medications.    Do not take more than prescribed Pain, Sleep and Anxiety Medications  Special Instructions: If you have smoked or chewed Tobacco  in the last 2 yrs please stop smoking, stop any regular Alcohol  and or any Recreational drug use.  Wear Seat belts while driving.   Please note  You were cared for by a hospitalist during your hospital stay. If you have any questions about your discharge medications or the care you received while you were in the hospital after you are discharged, you can call the unit and asked to speak with the hospitalist on call if the hospitalist that took care of you is not available. Once you are discharged, your primary care physician will handle any further medical issues. Please note that NO REFILLS for any discharge medications will be authorized once you are discharged, as it is imperative that you return to your primary care physician (or establish a relationship with a primary care physician if you do not have one) for your aftercare needs so that they can reassess your need for medications and monitor your lab values.

## 2016-03-25 DIAGNOSIS — M25562 Pain in left knee: Secondary | ICD-10-CM | POA: Diagnosis not present

## 2016-03-26 DIAGNOSIS — M25562 Pain in left knee: Secondary | ICD-10-CM | POA: Diagnosis not present

## 2016-03-26 DIAGNOSIS — M25462 Effusion, left knee: Secondary | ICD-10-CM | POA: Diagnosis not present

## 2016-03-26 DIAGNOSIS — M6281 Muscle weakness (generalized): Secondary | ICD-10-CM | POA: Diagnosis not present

## 2016-03-26 DIAGNOSIS — M25662 Stiffness of left knee, not elsewhere classified: Secondary | ICD-10-CM | POA: Diagnosis not present

## 2016-03-26 LAB — CULTURE, BLOOD (ROUTINE X 2)

## 2016-03-27 LAB — CULTURE, BLOOD (ROUTINE X 2): Culture: NO GROWTH

## 2016-03-30 DIAGNOSIS — M6281 Muscle weakness (generalized): Secondary | ICD-10-CM | POA: Diagnosis not present

## 2016-03-30 DIAGNOSIS — M25562 Pain in left knee: Secondary | ICD-10-CM | POA: Diagnosis not present

## 2016-03-30 DIAGNOSIS — M25662 Stiffness of left knee, not elsewhere classified: Secondary | ICD-10-CM | POA: Diagnosis not present

## 2016-03-30 DIAGNOSIS — M25462 Effusion, left knee: Secondary | ICD-10-CM | POA: Diagnosis not present

## 2016-03-31 DIAGNOSIS — M542 Cervicalgia: Secondary | ICD-10-CM | POA: Diagnosis not present

## 2016-03-31 DIAGNOSIS — D72829 Elevated white blood cell count, unspecified: Secondary | ICD-10-CM | POA: Diagnosis not present

## 2016-03-31 DIAGNOSIS — Z23 Encounter for immunization: Secondary | ICD-10-CM | POA: Diagnosis not present

## 2016-03-31 DIAGNOSIS — Z09 Encounter for follow-up examination after completed treatment for conditions other than malignant neoplasm: Secondary | ICD-10-CM | POA: Diagnosis not present

## 2016-03-31 DIAGNOSIS — N183 Chronic kidney disease, stage 3 (moderate): Secondary | ICD-10-CM | POA: Diagnosis not present

## 2016-04-01 DIAGNOSIS — M25662 Stiffness of left knee, not elsewhere classified: Secondary | ICD-10-CM | POA: Diagnosis not present

## 2016-04-01 DIAGNOSIS — M6281 Muscle weakness (generalized): Secondary | ICD-10-CM | POA: Diagnosis not present

## 2016-04-01 DIAGNOSIS — M25462 Effusion, left knee: Secondary | ICD-10-CM | POA: Diagnosis not present

## 2016-04-01 DIAGNOSIS — M25562 Pain in left knee: Secondary | ICD-10-CM | POA: Diagnosis not present

## 2016-04-02 DIAGNOSIS — M542 Cervicalgia: Secondary | ICD-10-CM | POA: Diagnosis not present

## 2016-04-07 DIAGNOSIS — M25662 Stiffness of left knee, not elsewhere classified: Secondary | ICD-10-CM | POA: Diagnosis not present

## 2016-04-07 DIAGNOSIS — M25462 Effusion, left knee: Secondary | ICD-10-CM | POA: Diagnosis not present

## 2016-04-07 DIAGNOSIS — M25562 Pain in left knee: Secondary | ICD-10-CM | POA: Diagnosis not present

## 2016-04-07 DIAGNOSIS — M6281 Muscle weakness (generalized): Secondary | ICD-10-CM | POA: Diagnosis not present

## 2016-04-08 DIAGNOSIS — R7989 Other specified abnormal findings of blood chemistry: Secondary | ICD-10-CM | POA: Diagnosis not present

## 2016-04-15 DIAGNOSIS — M25462 Effusion, left knee: Secondary | ICD-10-CM | POA: Diagnosis not present

## 2016-04-15 DIAGNOSIS — M6281 Muscle weakness (generalized): Secondary | ICD-10-CM | POA: Diagnosis not present

## 2016-04-15 DIAGNOSIS — M25662 Stiffness of left knee, not elsewhere classified: Secondary | ICD-10-CM | POA: Diagnosis not present

## 2016-04-15 DIAGNOSIS — M25562 Pain in left knee: Secondary | ICD-10-CM | POA: Diagnosis not present

## 2016-04-22 DIAGNOSIS — M542 Cervicalgia: Secondary | ICD-10-CM | POA: Diagnosis not present

## 2016-04-29 DIAGNOSIS — M542 Cervicalgia: Secondary | ICD-10-CM | POA: Diagnosis not present

## 2016-04-30 ENCOUNTER — Encounter: Payer: Self-pay | Admitting: Family

## 2016-05-04 ENCOUNTER — Encounter: Payer: Self-pay | Admitting: Family

## 2016-05-04 ENCOUNTER — Ambulatory Visit (INDEPENDENT_AMBULATORY_CARE_PROVIDER_SITE_OTHER): Payer: Medicare Other | Admitting: Family

## 2016-05-04 ENCOUNTER — Ambulatory Visit (HOSPITAL_COMMUNITY)
Admission: RE | Admit: 2016-05-04 | Discharge: 2016-05-04 | Disposition: A | Discharge status: Home / Self Care | Payer: Medicare Other | Source: Ambulatory Visit | Attending: Family | Admitting: Family

## 2016-05-04 VITALS — BP 126/80 | HR 70 | Temp 98.4°F | Resp 18 | Ht 71.0 in | Wt 264.0 lb

## 2016-05-04 DIAGNOSIS — I6529 Occlusion and stenosis of unspecified carotid artery: Secondary | ICD-10-CM | POA: Diagnosis not present

## 2016-05-04 DIAGNOSIS — Z9889 Other specified postprocedural states: Secondary | ICD-10-CM | POA: Diagnosis not present

## 2016-05-04 DIAGNOSIS — Z48812 Encounter for surgical aftercare following surgery on the circulatory system: Secondary | ICD-10-CM | POA: Diagnosis not present

## 2016-05-04 DIAGNOSIS — I6523 Occlusion and stenosis of bilateral carotid arteries: Secondary | ICD-10-CM | POA: Diagnosis not present

## 2016-05-04 LAB — VAS US CAROTID
LCCADSYS: 71 cm/s
LCCAPDIAS: 19 cm/s
LCCAPSYS: 91 cm/s
LEFT ECA DIAS: -11 cm/s
Left CCA dist dias: 16 cm/s
Left ICA dist dias: -23 cm/s
Left ICA dist sys: -64 cm/s
Left ICA prox dias: -18 cm/s
Left ICA prox sys: -59 cm/s
RCCAPDIAS: 13 cm/s
RIGHT CCA MID DIAS: 15 cm/s
Right CCA prox sys: 87 cm/s
Right cca dist sys: -86 cm/s

## 2016-05-04 NOTE — Patient Instructions (Signed)
Stroke Prevention Some medical conditions and behaviors are associated with an increased chance of having a stroke. You may prevent a stroke by making healthy choices and managing medical conditions. HOW CAN I REDUCE MY RISK OF HAVING A STROKE?   Stay physically active. Get at least 30 minutes of activity on most or all days.  Do not smoke. It may also be helpful to avoid exposure to secondhand smoke.  Limit alcohol use. Moderate alcohol use is considered to be:  No more than 2 drinks per day for men.  No more than 1 drink per day for nonpregnant women.  Eat healthy foods. This involves:  Eating 5 or more servings of fruits and vegetables a day.  Making dietary changes that address high blood pressure (hypertension), high cholesterol, diabetes, or obesity.  Manage your cholesterol levels.  Making food choices that are high in fiber and low in saturated fat, trans fat, and cholesterol may control cholesterol levels.  Take any prescribed medicines to control cholesterol as directed by your health care provider.  Manage your diabetes.  Controlling your carbohydrate and sugar intake is recommended to manage diabetes.  Take any prescribed medicines to control diabetes as directed by your health care provider.  Control your hypertension.  Making food choices that are low in salt (sodium), saturated fat, trans fat, and cholesterol is recommended to manage hypertension.  Ask your health care provider if you need treatment to lower your blood pressure. Take any prescribed medicines to control hypertension as directed by your health care provider.  If you are 18-39 years of age, have your blood pressure checked every 3-5 years. If you are 40 years of age or older, have your blood pressure checked every year.  Maintain a healthy weight.  Reducing calorie intake and making food choices that are low in sodium, saturated fat, trans fat, and cholesterol are recommended to manage  weight.  Stop drug abuse.  Avoid taking birth control pills.  Talk to your health care provider about the risks of taking birth control pills if you are over 35 years old, smoke, get migraines, or have ever had a blood clot.  Get evaluated for sleep disorders (sleep apnea).  Talk to your health care provider about getting a sleep evaluation if you snore a lot or have excessive sleepiness.  Take medicines only as directed by your health care provider.  For some people, aspirin or blood thinners (anticoagulants) are helpful in reducing the risk of forming abnormal blood clots that can lead to stroke. If you have the irregular heart rhythm of atrial fibrillation, you should be on a blood thinner unless there is a good reason you cannot take them.  Understand all your medicine instructions.  Make sure that other conditions (such as anemia or atherosclerosis) are addressed. SEEK IMMEDIATE MEDICAL CARE IF:   You have sudden weakness or numbness of the face, arm, or leg, especially on one side of the body.  Your face or eyelid droops to one side.  You have sudden confusion.  You have trouble speaking (aphasia) or understanding.  You have sudden trouble seeing in one or both eyes.  You have sudden trouble walking.  You have dizziness.  You have a loss of balance or coordination.  You have a sudden, severe headache with no known cause.  You have new chest pain or an irregular heartbeat. Any of these symptoms may represent a serious problem that is an emergency. Do not wait to see if the symptoms will   go away. Get medical help at once. Call your local emergency services (911 in [place] Do not drive yourself to the hospital.   This information is not intended to replace advice given to you by your health care provider. Make sure you discuss any questions you have with your health care provider.   Document Released: 07/30/2004 Document Revised: 07/13/2014 Document Reviewed:  12/23/2012 Elsevier Interactive Patient Education 2016 Elsevier Inc.  

## 2016-05-04 NOTE — Progress Notes (Signed)
Chief Complaint: Follow up Extracranial Carotid Artery Stenosis   History of Present Illness  Brad Day is a 75 y.o. male patient of Dr. Oneida Alar who is status post left CEA in 2007 and returns today for follow up.  He had 4 preoperative TIA's as manifested by expressive aphasia, none since the CEA.  Patient denies cardiac problems.   The patient denies any history of amaurosis fugax or monocular blindness, unilateral facial drooping, or hemiplegia. He denies claudication symptoms with walking, denies non healing wounds.  He denies any steal symptoms in either arm/hand, denies dizziness.  He was evaluated for and diagnosed with a cervical spine HNP, with have a follow up re this soon.  Pt Diabetic: Yes, states "doing fine"  Pt smoker: former smoker, quit 1978   Pt meds include:  Statin : Yes  Betablocker: Yes  ASA: No: due to history of GI ulcers with bleeding  Other anticoagulants/antiplatelets: Xarelto started after the left knee replacement, was stopped 3 weeks after the left knee replaced in August 2017    Past Medical History:  Diagnosis Date  . Arthritis   . Carotid artery occlusion   . Coronary artery disease   . Diabetes mellitus without complication ([place])   . Fall down embankment Oct. 26, 2016   Hurt Right shoulder  . History of bleeding ulcers   . Hypertension   . Shortness of breath dyspnea    with exertion  . Stroke [facility])    4 mini strokes    Social History Social History  Substance Use Topics  . Smoking status: Former Smoker    Quit date: 07/06/1976  . Smokeless tobacco: Former Systems developer    Types: Chew  . Alcohol use No    Family History Family History  Problem Relation Age of Onset  . Stroke Mother   . Heart disease Father     Heart Disease before age 67  . Heart attack Father     X's 70  . Emphysema Sister   . Other Brother     train accident   . Cancer Brother   . Alzheimer's disease Brother     Surgical History Past  Surgical History:  Procedure Laterality Date  . CAROTID ENDARTERECTOMY  2007   Left CEA  . CORONARY ARTERY BYPASS GRAFT  2001   Dr. Cyndia Bent  . EYE SURGERY Right Sept.  1, 2014   Cataract  . EYE SURGERY Left Sept.   27, 2014   Cataract  . TOTAL KNEE ARTHROPLASTY Left 02/25/2016   Procedure: TOTAL KNEE ARTHROPLASTY;  Surgeon: Renette Butters, MD;  Location: [place];  Service: Orthopedics;  Laterality: Left;    Allergies  Allergen Reactions  . Asa [Aspirin] Other (See Comments)    Causes bleeding Ulcer    Current Outpatient Prescriptions  Medication Sig Dispense Refill  . atorvastatin (LIPITOR) 40 MG tablet Take 40 mg by mouth daily.     Marland Kitchen lisinopril (PRINIVIL,ZESTRIL) 10 MG tablet Take 10 mg by mouth at bedtime.     . metFORMIN (GLUCOPHAGE) 500 MG tablet Take 1 tablet (500 mg total) by mouth 2 (two) times daily with a meal.    . metoprolol (LOPRESSOR) 50 MG tablet Take 50 mg by mouth 2 (two) times daily.    . pantoprazole (PROTONIX) 40 MG tablet Take 40 mg by mouth daily.    . pioglitazone (ACTOS) 15 MG tablet Take 15 mg by mouth at bedtime.     . terbinafine (LAMISIL) 250 MG  tablet Take 250 mg by mouth daily.    . cefpodoxime (VANTIN) 200 MG tablet Take 1 tablet (200 mg total) by mouth every 12 (twelve) hours. (Patient not taking: Reported on 05/04/2016) 10 tablet 0  . cyclobenzaprine (FLEXERIL) 5 MG tablet Take 1 tablet (5 mg total) by mouth 3 (three) times daily as needed for muscle spasms. (Patient not taking: Reported on 05/04/2016) 30 tablet 0  . docusate sodium (COLACE) 100 MG capsule Take 1 capsule (100 mg total) by mouth 2 (two) times daily. To prevent constipation while taking pain medication. (Patient not taking: Reported on 05/04/2016) 60 capsule 0  . methylPREDNISolone (MEDROL DOSEPAK) 4 MG TBPK tablet follow package directions (Patient not taking: Reported on 05/04/2016) 21 tablet 0  . Omega-3 Fatty Acids (FISH OIL) 1200 MG CAPS Take 1,200 mg by mouth 2 (two) times daily.     . ondansetron (ZOFRAN) 4 MG tablet Take 1 tablet (4 mg total) by mouth every 8 (eight) hours as needed for nausea or vomiting. (Patient not taking: Reported on 05/04/2016) 40 tablet 0  . oxyCODONE-acetaminophen (PERCOCET/ROXICET) 5-325 MG tablet Take 1 tablet by mouth every 4 (four) hours as needed for severe pain. (Patient not taking: Reported on 05/04/2016) 6 tablet 0  . rivaroxaban (XARELTO) 10 MG TABS tablet Take 1 tablet (10 mg total) by mouth daily. (Patient not taking: Reported on 05/04/2016) 30 tablet 0   No current facility-administered medications for this visit.     Review of Systems : See HPI for pertinent positives and negatives.  Physical Examination  Vitals:   05/04/16 1419 05/04/16 1420  BP: 131/80 126/80  Pulse: 70   Resp: 18   Temp: 98.4 F (36.9 C)   TempSrc: Oral   SpO2: 100%   Weight: 264 lb (119.7 kg)   Height: 5\' 11"  (1.803 m)    Body mass index is 36.82 kg/m.  General: WDWN obese male in NAD  GAIT: normal  Eyes: PERRLA  Pulmonary: Respirations are non labored, CTAB, no rales, no rhonchi, & no wheezing.  Cardiac: regular rhythm, no detected murmur.  VASCULAR EXAM  Carotid Bruits  Left  Right    Negative  Negative   Aorta is not palpable.  Radial pulses are 2+ palpable and equal.   LE Pulses  LEFT  RIGHT   POPLITEAL  not palpable  not palpable   POSTERIOR TIBIAL  2+palpable  2+palpable   DORSALIS PEDIS  ANTERIOR TIBIAL  1+ palpable  2+palpable    Gastrointestinal: soft, nontender, BS WNL, no r/g, no palpated masses.  Musculoskeletal: No muscle atrophy/wasting. M/S 5/5 throughout, Extremities without ischemic changes.  Neurologic: A&O X 3; Appropriate Affect, normal sensation;  Speech is normal  CN 2-12 intact except, Pain and light touch intact in extremities, Motor exam as listed above.    Assessment: Brad Day is a 75 y.o. male who is status post left CEA in 2007. He had 4 preoperative TIA's, none  subsequently.  DATA Today's carotid duplex suggests right internal carotid artery stenosis present in the less than 40% range. Left internal carotid artery is patent with history of carotid endarterectomy, no hyperplasia or hemodynamically significant changes present. Essentially unchanged since study on 05/02/15.    Plan: Follow-up in 1 year with Carotid Duplex scan.    I discussed in depth with the patient the nature of atherosclerosis, and emphasized the importance of maximal medical management including strict control of blood pressure, blood glucose, and lipid levels, obtaining regular exercise, and contined ucessation  of smoking.  The patient is aware that without maximal medical management the underlying atherosclerotic disease process will progress, limiting the benefit of any interventions. The patient was given information about stroke prevention and what symptoms should prompt the patient to seek immediate medical care. Thank you for allowing [place] to participate in this patient's care.  Clemon Chambers, RN, MSN, FNP-C [facility] Office: [facility] Physician: Trula Slade  05/04/16 2:37 PM

## 2016-05-05 ENCOUNTER — Other Ambulatory Visit: Payer: Self-pay | Admitting: *Deleted

## 2016-05-05 DIAGNOSIS — M542 Cervicalgia: Secondary | ICD-10-CM | POA: Diagnosis not present

## 2016-05-05 DIAGNOSIS — I6529 Occlusion and stenosis of unspecified carotid artery: Secondary | ICD-10-CM

## 2016-05-06 DIAGNOSIS — M25561 Pain in right knee: Secondary | ICD-10-CM | POA: Diagnosis not present

## 2016-05-07 ENCOUNTER — Encounter (HOSPITAL_COMMUNITY): Payer: Medicare Other

## 2016-05-07 ENCOUNTER — Ambulatory Visit: Payer: Medicare Other | Admitting: Family

## 2016-05-12 DIAGNOSIS — M542 Cervicalgia: Secondary | ICD-10-CM | POA: Diagnosis not present

## 2016-05-14 ENCOUNTER — Telehealth: Payer: Self-pay | Admitting: *Deleted

## 2016-05-14 NOTE — Telephone Encounter (Signed)
Cardiac clearance for right total knee replacement faxed to [facility]. Recent OV and EKG faxed along w/ clearnce.  Clearance: Intermediate risk for an intermediate risk procedure.  Recommend continuing stating and beta blocker throughout perioperative time.

## 2016-05-15 DIAGNOSIS — I10 Essential (primary) hypertension: Secondary | ICD-10-CM | POA: Diagnosis not present

## 2016-05-15 DIAGNOSIS — M542 Cervicalgia: Secondary | ICD-10-CM | POA: Diagnosis not present

## 2016-05-15 DIAGNOSIS — Z6836 Body mass index (BMI) 36.0-36.9, adult: Secondary | ICD-10-CM | POA: Diagnosis not present

## 2016-06-12 DIAGNOSIS — N183 Chronic kidney disease, stage 3 (moderate): Secondary | ICD-10-CM | POA: Diagnosis not present

## 2016-06-12 DIAGNOSIS — Z9889 Other specified postprocedural states: Secondary | ICD-10-CM | POA: Diagnosis not present

## 2016-06-12 DIAGNOSIS — I779 Disorder of arteries and arterioles, unspecified: Secondary | ICD-10-CM | POA: Diagnosis not present

## 2016-06-12 DIAGNOSIS — E782 Mixed hyperlipidemia: Secondary | ICD-10-CM | POA: Diagnosis not present

## 2016-06-12 DIAGNOSIS — I10 Essential (primary) hypertension: Secondary | ICD-10-CM | POA: Diagnosis not present

## 2016-06-12 DIAGNOSIS — E1122 Type 2 diabetes mellitus with diabetic chronic kidney disease: Secondary | ICD-10-CM | POA: Diagnosis not present

## 2016-06-12 DIAGNOSIS — E669 Obesity, unspecified: Secondary | ICD-10-CM | POA: Diagnosis not present

## 2016-06-12 DIAGNOSIS — I2510 Atherosclerotic heart disease of native coronary artery without angina pectoris: Secondary | ICD-10-CM | POA: Diagnosis not present

## 2016-06-12 DIAGNOSIS — K279 Peptic ulcer, site unspecified, unspecified as acute or chronic, without hemorrhage or perforation: Secondary | ICD-10-CM | POA: Diagnosis not present

## 2016-07-27 DIAGNOSIS — M1711 Unilateral primary osteoarthritis, right knee: Secondary | ICD-10-CM | POA: Diagnosis not present

## 2016-09-07 DIAGNOSIS — M1711 Unilateral primary osteoarthritis, right knee: Secondary | ICD-10-CM | POA: Diagnosis not present

## 2016-09-07 NOTE — H&P (Signed)
PREOPERATIVE H&P Patient ID: Brad Day MRN: [id number] DOB/AGE: 75/23/1942 75 y.o.  Chief Complaint: OA RIGHT KNEE  Planned Procedure Date: 09/29/16 Medical Clearance by Dr. Olen Pel Cardiac Clearance by Dr. Curt Bears Additional clearance by Vascular: Dr. Oneida Alar   HPI: Brad Day is a 76 y.o. male with a history of CVA, b/l carotid artery disease s/p Left CEA and right <40% stenosis, CABG x5 in 2001, COPD, Hx of Bleeding GI Ulcer, CKD III baseline Cr ~1.4, Cervical DJD, and DM who presents for evaluation of OA RIGHT KNEE. The patient has a history of pain and functional disability in the right knee due to arthritis and has failed non-surgical conservative treatments for greater than 12 weeks to include NSAID's and/or analgesics, corticosteriod injections and activity modification.  Onset of symptoms was gradual, starting >10 years ago with gradually worsening course since that time.  Patient currently rates pain at 8 out of 10 with activity. Patient has night pain, worsening of pain with activity and weight bearing, pain that interferes with activities of daily living and pain with passive range of motion.  Patient has evidence of subchondral cysts, subchondral sclerosis, periarticular osteophytes and joint space narrowing by imaging studies.  There is no active infection.  Past Medical History:  Diagnosis Date  . Arthritis   . Carotid artery occlusion   . Coronary artery disease   . Diabetes mellitus without complication ([place])   . Fall down embankment Oct. 26, 2016   Hurt Right shoulder  . History of bleeding ulcers   . Hypertension   . Shortness of breath dyspnea    with exertion  . Stroke [facility])    4 mini strokes   Past Surgical History:  Procedure Laterality Date  . CAROTID ENDARTERECTOMY  2007   Left CEA  . CORONARY ARTERY BYPASS GRAFT  2001   Dr. Cyndia Bent  . EYE SURGERY Right Sept.  1, 2014   Cataract  . EYE SURGERY Left Sept.   27, 2014   Cataract  . TOTAL KNEE  ARTHROPLASTY Left 02/25/2016   Procedure: TOTAL KNEE ARTHROPLASTY;  Surgeon: Renette Butters, MD;  Location: [place];  Service: Orthopedics;  Laterality: Left;   Allergies  Allergen Reactions  . Asa [Aspirin] Other (See Comments)    Causes bleeding Ulcer   Prior to Admission medications   Medication Sig Start Date End Date Taking? Authorizing Provider  atorvastatin (LIPITOR) 40 MG tablet Take 40 mg by mouth daily.  03/30/15   Historical Provider, MD  cefpodoxime (VANTIN) 200 MG tablet Take 1 tablet (200 mg total) by mouth every 12 (twelve) hours. Patient not taking: Reported on 05/04/2016 03/24/16   Thurnell Lose, MD  cyclobenzaprine (FLEXERIL) 5 MG tablet Take 1 tablet (5 mg total) by mouth 3 (three) times daily as needed for muscle spasms. Patient not taking: Reported on 05/04/2016 03/24/16   Thurnell Lose, MD  docusate sodium (COLACE) 100 MG capsule Take 1 capsule (100 mg total) by mouth 2 (two) times daily. To prevent constipation while taking pain medication. Patient not taking: Reported on 05/04/2016 02/25/16   Renette Butters, MD  lisinopril (PRINIVIL,ZESTRIL) 10 MG tablet Take 10 mg by mouth at bedtime.     Historical Provider, MD  metFORMIN (GLUCOPHAGE) 500 MG tablet Take 1 tablet (500 mg total) by mouth 2 (two) times daily with a meal. 03/24/16   Thurnell Lose, MD  methylPREDNISolone (MEDROL DOSEPAK) 4 MG TBPK tablet follow package directions Patient not taking: Reported on 05/04/2016  03/24/16   Thurnell Lose, MD  metoprolol (LOPRESSOR) 50 MG tablet Take 50 mg by mouth 2 (two) times daily.    Historical Provider, MD  Omega-3 Fatty Acids (FISH OIL) 1200 MG CAPS Take 1,200 mg by mouth 2 (two) times daily.    Historical Provider, MD  ondansetron (ZOFRAN) 4 MG tablet Take 1 tablet (4 mg total) by mouth every 8 (eight) hours as needed for nausea or vomiting. Patient not taking: Reported on 05/04/2016 02/25/16   Renette Butters, MD  oxyCODONE-acetaminophen (PERCOCET/ROXICET) 5-325  MG tablet Take 1 tablet by mouth every 4 (four) hours as needed for severe pain. Patient not taking: Reported on 05/04/2016 06/14/15   Shawn C Joy, PA-C  pantoprazole (PROTONIX) 40 MG tablet Take 40 mg by mouth daily.    Historical Provider, MD  pioglitazone (ACTOS) 15 MG tablet Take 15 mg by mouth at bedtime.  04/02/15   Historical Provider, MD  rivaroxaban (XARELTO) 10 MG TABS tablet Take 1 tablet (10 mg total) by mouth daily. Patient not taking: Reported on 05/04/2016 02/25/16   Renette Butters, MD  terbinafine (LAMISIL) 250 MG tablet Take 250 mg by mouth daily.    Historical Provider, MD   Social History   Social History  . Marital status: Married    Spouse name: N/A  . Number of children: N/A  . Years of education: N/A   Social History Main Topics  . Smoking status: Former Smoker    Quit date: 07/06/1976  . Smokeless tobacco: Former Systems developer    Types: Chew  . Alcohol use No  . Drug use: No  . Sexual activity: Not on file   Other Topics Concern  . Not on file   Social History Narrative  . No narrative on file   Family History  Problem Relation Age of Onset  . Stroke Mother   . Heart disease Father     Heart Disease before age 78  . Heart attack Father     X's 52  . Emphysema Sister   . Other Brother     train accident   . Cancer Brother   . Alzheimer's disease Brother     ROS: Currently denies lightheadedness, dizziness, Fever, chills, CP, SOB.   No personal history of DVT, PE No teeth.  All other systems have been reviewed and were otherwise currently negative with the exception of those mentioned in the HPI and as above.  Objective: Vitals: Ht: 5'10" Wt: 263 Temp: 97.8 BP: 134/74 Pulse: 66 O2 97% on room air. Physical Exam: General: Alert, NAD.  Antalgic Gait  HEENT: EOMI, Good Neck Extension  Pulm: No increased work of breathing.  Clear B/L A/P w/o crackle or wheeze. CV: RRR, distant sounds No m/g/r appreciated  GI: soft, NT, ND Neuro: Neuro without gross focal  deficit.  Sensation intact distally Skin: No lesions in the area of chief complaint MSK/Surgical Site: Right knee varus deformity.  Range of motion 5-90 degrees.  Medial joint line tenderness.  Neurovascularly intact.  Distal sensation intact.    Imaging Review Plain radiographs demonstrate severe degenerative joint disease of the right knee.   Assessment: OA RIGHT KNEE Principal Problem:   Primary osteoarthritis of right knee Active Problems:   Occlusion and stenosis of carotid artery   Bilateral carotid artery disease (HCC)   History of CVA (cerebrovascular accident) without residual deficits   History of coronary artery bypass, five   Diabetes mellitus type 2, controlled (HCC)   CKD (chronic  kidney disease) stage 3, GFR 30-59 ml/min   Plan: Plan for Procedure(s): TOTAL KNEE ARTHROPLASTY  The patient history, physical exam, clinical judgement of the provider and imaging are consistent with end stage degenerative joint disease and total joint arthroplasty is deemed medically necessary. The treatment options including medical management, injection therapy, and arthroplasty were discussed at length. The risks and benefits of Procedure(s): TOTAL KNEE ARTHROPLASTY were presented and reviewed.  The risks of nonoperative treatment, versus surgical intervention including but not limited to continued pain, aseptic loosening, stiffness, dislocation/subluxation, infection, bleeding, nerve injury, blood clots, cardiopulmonary complications, morbidity, mortality, among others were discussed. The patient verbalizes understanding and wishes to proceed with the plan.  Patient is being admitted for inpatient treatment for surgery, pain control, PT, OT, prophylactic antibiotics, VTE prophylaxis, progressive ambulation, ADL's and discharge planning.   Dental prophylaxis discussed and recommended for 2 years postoperatively.  The patient does meet the criteria for TXA which will be used perioperatively  via IV.   Xarelto  will be used postoperatively for DVT prophylaxis in addition to SCDs, and early ambulation.  No ASA - hx of bleeding ulcer. The patient is planning to be discharged home with home health services ([facility]) in care of his wife. Moderate CV Risk.  Continue BB / Statin.  Prudencio Burly III, PA-C 09/07/2016 1:49 PM

## 2016-09-15 DIAGNOSIS — Z961 Presence of intraocular lens: Secondary | ICD-10-CM | POA: Diagnosis not present

## 2016-09-15 DIAGNOSIS — H52203 Unspecified astigmatism, bilateral: Secondary | ICD-10-CM | POA: Diagnosis not present

## 2016-09-15 DIAGNOSIS — E119 Type 2 diabetes mellitus without complications: Secondary | ICD-10-CM | POA: Diagnosis not present

## 2016-09-18 ENCOUNTER — Encounter (HOSPITAL_COMMUNITY)
Admission: RE | Admit: 2016-09-18 | Discharge: 2016-09-18 | Disposition: A | Discharge status: Home / Self Care | Payer: Medicare Other | Source: Ambulatory Visit | Attending: Orthopedic Surgery | Admitting: Orthopedic Surgery

## 2016-09-18 ENCOUNTER — Encounter (HOSPITAL_COMMUNITY): Payer: Self-pay

## 2016-09-18 DIAGNOSIS — Z01812 Encounter for preprocedural laboratory examination: Secondary | ICD-10-CM | POA: Insufficient documentation

## 2016-09-18 DIAGNOSIS — E119 Type 2 diabetes mellitus without complications: Secondary | ICD-10-CM | POA: Insufficient documentation

## 2016-09-18 DIAGNOSIS — I2510 Atherosclerotic heart disease of native coronary artery without angina pectoris: Secondary | ICD-10-CM | POA: Diagnosis not present

## 2016-09-18 DIAGNOSIS — Z7984 Long term (current) use of oral hypoglycemic drugs: Secondary | ICD-10-CM | POA: Diagnosis not present

## 2016-09-18 DIAGNOSIS — K219 Gastro-esophageal reflux disease without esophagitis: Secondary | ICD-10-CM | POA: Insufficient documentation

## 2016-09-18 DIAGNOSIS — Z96652 Presence of left artificial knee joint: Secondary | ICD-10-CM | POA: Diagnosis not present

## 2016-09-18 DIAGNOSIS — Z01818 Encounter for other preprocedural examination: Secondary | ICD-10-CM | POA: Insufficient documentation

## 2016-09-18 DIAGNOSIS — Z951 Presence of aortocoronary bypass graft: Secondary | ICD-10-CM | POA: Insufficient documentation

## 2016-09-18 DIAGNOSIS — Z8673 Personal history of transient ischemic attack (TIA), and cerebral infarction without residual deficits: Secondary | ICD-10-CM | POA: Insufficient documentation

## 2016-09-18 DIAGNOSIS — Z87891 Personal history of nicotine dependence: Secondary | ICD-10-CM | POA: Diagnosis not present

## 2016-09-18 DIAGNOSIS — M1711 Unilateral primary osteoarthritis, right knee: Secondary | ICD-10-CM | POA: Insufficient documentation

## 2016-09-18 DIAGNOSIS — I10 Essential (primary) hypertension: Secondary | ICD-10-CM | POA: Diagnosis not present

## 2016-09-18 DIAGNOSIS — Z79899 Other long term (current) drug therapy: Secondary | ICD-10-CM | POA: Insufficient documentation

## 2016-09-18 HISTORY — DX: Gastro-esophageal reflux disease without esophagitis: K21.9

## 2016-09-18 LAB — BASIC METABOLIC PANEL
Anion gap: 7 (ref 5–15)
BUN: 22 mg/dL — ABNORMAL HIGH (ref 6–20)
CALCIUM: 9.8 mg/dL (ref 8.9–10.3)
CO2: 25 mmol/L (ref 22–32)
CREATININE: 1.63 mg/dL — AB (ref 0.61–1.24)
Chloride: 110 mmol/L (ref 101–111)
GFR calc Af Amer: 46 mL/min — ABNORMAL LOW (ref >60)
GFR calc non Af Amer: 40 mL/min — ABNORMAL LOW (ref >60)
GLUCOSE: 133 mg/dL — AB (ref 65–99)
Potassium: 4.5 mmol/L (ref 3.5–5.1)
Sodium: 142 mmol/L (ref 135–145)

## 2016-09-18 LAB — CBC
HCT: 41 % (ref 39.0–52.0)
Hemoglobin: 13.1 g/dL (ref 13.0–17.0)
MCH: 28.3 pg (ref 26.0–34.0)
MCHC: 32 g/dL (ref 30.0–36.0)
MCV: 88.6 fL (ref 78.0–100.0)
PLATELETS: 189 10*3/uL (ref 150–400)
RBC: 4.63 MIL/uL (ref 4.22–5.81)
RDW: 15.1 % (ref 11.5–15.5)
WBC: 9.2 10*3/uL (ref 4.0–10.5)

## 2016-09-18 LAB — SURGICAL PCR SCREEN
MRSA, PCR: NEGATIVE
Staphylococcus aureus: NEGATIVE

## 2016-09-18 LAB — GLUCOSE, CAPILLARY: Glucose-Capillary: 129 mg/dL — ABNORMAL HIGH (ref 65–99)

## 2016-09-18 NOTE — Pre-Procedure Instructions (Signed)
    Brad Day  09/18/2016      [place], [place] [facility] [place] [address] [place] [address] Phone: [phone] Fax: [place] [address] [place] [address] Phone: [phone] Fax: [phone]    Your procedure is scheduled on 09/29/16.  Report to [facility] Admitting at 530 A.M.  Call this number if you have problems the morning of surgery:  [phone]   Remember:  Do not eat food or drink liquids after midnight.  Take these medicines the morning of surgery with A SIP OF WATER --tylenol,metoprolol,protonix,oxycodone   Do not wear jewelry, make-up or nail polish.  Do not wear lotions, powders, or perfumes, or deoderant.  Do not shave 48 hours prior to surgery.  Men may shave face and neck.  Do not bring valuables to the hospital.  [facility] is not responsible for any belongings or valuables.  Contacts, dentures or bridgework may not be worn into surgery.  Leave your suitcase in the car.  After surgery it may be brought to your room.  For patients admitted to the hospital, discharge time will be determined by your treatment team.  Patients discharged the day of surgery will not be allowed to drive home.   Name and phone number of your driver:    Special instructions:  Do not take any aspirin,anti-inflammatories,vitamins,or herbal supplements 5-7 days prior to surgery.  Please read over the following fact sheets that you were given. MRSA Information

## 2016-09-19 LAB — HEMOGLOBIN A1C
HEMOGLOBIN A1C: 7.7 % — AB (ref 4.8–5.6)
MEAN PLASMA GLUCOSE: 174 mg/dL

## 2016-09-24 NOTE — Progress Notes (Signed)
Anesthesia Chart Review:  Pt is a 76 year old male scheduled for R total knee arthroplasty on 09/29/2016 with Edmonia Lynch, MD.   PCP is Marjean Donna, MD, who cleared pt for surgery at last office visit 06/12/16.   PMH includes:  CAD (s/p CABG x5 2001), HTN, DM, carotid artery occlusion (s/p CEA 2007), stroke, GERD. Former smoker. BMI 39. S/p L TKA 02/25/16.   Medications include: lipitor, lisinopril, metformin, metoprolol, protonix, pioglitazone  Preoperative labs reviewed.   - HgbA1c 7.7, glucose 133.  - Cr 1.63, BUN 22. This is consistent with prior results.    EKG 12/23/15: NSR. RBBB. LAFB.   Carotid duplex 05/02/15:   1. 1-39% R ICA stenosis 2. Patent L CEA   Echo 01/10/16:  - Left ventricle: The cavity size was normal. Wall thickness was increased in a pattern of moderate LVH. Systolic function was normal. The estimated ejection fraction was in the range of 55%to 60%. Wall motion was normal; there were no regional wall motion abnormalities. Left ventricular diastolic function parameters were normal. - Aortic valve: There was trivial regurgitation. - Left atrium: The atrium was mildly dilated. - Atrial septum: No defect or patent foramen ovale was identified.  Carotid duplex 05/02/15:   1. <40% R ICA stenosis 2. Patent L CEA   Pt tolerated same procedure last August without issue. If no changes, I anticipate pt can proceed with surgery as scheduled.   Willeen Cass, FNP-BC [facility]/Anesthesiology Phone: [phone] 09/24/2016 12:18 PM

## 2016-09-28 ENCOUNTER — Encounter (HOSPITAL_COMMUNITY): Payer: Self-pay | Admitting: Anesthesiology

## 2016-09-28 MED ORDER — ACETAMINOPHEN 500 MG PO TABS
1000.0000 mg | ORAL_TABLET | Freq: Once | ORAL | Status: DC
  Filled 2016-09-28: qty 2

## 2016-09-28 MED ORDER — DEXTROSE 5 % IV SOLN
3.0000 g | INTRAVENOUS | Status: AC
  Administered 2016-09-29: 3 g via INTRAVENOUS
  Filled 2016-09-28: qty 3000

## 2016-09-28 MED ORDER — TRANEXAMIC ACID 1000 MG/10ML IV SOLN
1000.0000 mg | INTRAVENOUS | Status: AC
  Administered 2016-09-29: 1000 mg via INTRAVENOUS
  Filled 2016-09-28: qty 10

## 2016-09-28 MED ORDER — LACTATED RINGERS IV SOLN: INTRAVENOUS | Status: DC

## 2016-09-28 NOTE — Anesthesia Preprocedure Evaluation (Addendum)
Anesthesia Evaluation  Patient identified by MRN, date of birth, ID band Patient awake    Reviewed: Allergy & Precautions, NPO status , Patient's Chart, lab work & pertinent test results, reviewed documented beta blocker date and time   Airway Mallampati: II  TM Distance: >3 FB Neck ROM: Full    Dental  (+) Edentulous Upper, Edentulous Lower   Pulmonary neg pulmonary ROS, former smoker,    breath sounds clear to auscultation       Cardiovascular hypertension, Pt. on medications and Pt. on home beta blockers + CAD and + Peripheral Vascular Disease  negative cardio ROS   Rhythm:Regular Rate:Normal     Neuro/Psych  Headaches, CVA negative psych ROS   GI/Hepatic Neg liver ROS, GERD  Medicated,  Endo/Other  diabetes, Type 2, Oral Hypoglycemic Agents  Renal/GU Renal disease  negative genitourinary   Musculoskeletal  (+) Arthritis , Osteoarthritis,    Abdominal   Peds negative pediatric ROS (+)  Hematology negative hematology ROS (+)   Anesthesia Other Findings Day of surgery medications reviewed with the patient.  Reproductive/Obstetrics negative OB ROS                            Echo 2017 - Left ventricle: The cavity size was normal. Wall thickness was   increased in a pattern of moderate LVH. Systolic function was   normal. The estimated ejection fraction was in the range of 55%   to 60%. Wall motion was normal; there were no regional wall   motion abnormalities. Left ventricular diastolic function   parameters were normal. - Aortic valve: There was trivial regurgitation. - Left atrium: The atrium was mildly dilated. - Atrial septum: No defect or patent foramen ovale was identified.   Anesthesia Physical Anesthesia Plan  ASA: III  Anesthesia Plan: Spinal   Post-op Pain Management:  Regional for Post-op pain   Induction: Intravenous  Airway Management Planned: Natural  Airway  Additional Equipment:   Intra-op Plan:   Post-operative Plan:   Informed Consent: I have reviewed the patients History and Physical, chart, labs and discussed the procedure including the risks, benefits and alternatives for the proposed anesthesia with the patient or authorized representative who has indicated his/her understanding and acceptance.     Plan Discussed with: CRNA  Anesthesia Plan Comments:         Anesthesia Quick Evaluation

## 2016-09-29 ENCOUNTER — Inpatient Hospital Stay (HOSPITAL_COMMUNITY): Payer: Medicare Other

## 2016-09-29 ENCOUNTER — Inpatient Hospital Stay (HOSPITAL_COMMUNITY): Payer: Medicare Other | Admitting: Certified Registered Nurse Anesthetist

## 2016-09-29 ENCOUNTER — Inpatient Hospital Stay (HOSPITAL_COMMUNITY)
Admission: RE | Admit: 2016-09-29 | Discharge: 2016-09-30 | DRG: 470 | Disposition: A | Discharge status: Home Health | Payer: Medicare Other | Source: Ambulatory Visit | Attending: Orthopedic Surgery | Admitting: Orthopedic Surgery

## 2016-09-29 ENCOUNTER — Encounter (HOSPITAL_COMMUNITY)
Admission: RE | Disposition: A | Discharge status: Home / Self Care | Payer: Self-pay | Source: Ambulatory Visit | Attending: Orthopedic Surgery

## 2016-09-29 ENCOUNTER — Inpatient Hospital Stay (HOSPITAL_COMMUNITY): Payer: Medicare Other | Admitting: Emergency Medicine

## 2016-09-29 DIAGNOSIS — K219 Gastro-esophageal reflux disease without esophagitis: Secondary | ICD-10-CM | POA: Diagnosis present

## 2016-09-29 DIAGNOSIS — I2510 Atherosclerotic heart disease of native coronary artery without angina pectoris: Secondary | ICD-10-CM | POA: Diagnosis not present

## 2016-09-29 DIAGNOSIS — Z8673 Personal history of transient ischemic attack (TIA), and cerebral infarction without residual deficits: Secondary | ICD-10-CM | POA: Diagnosis not present

## 2016-09-29 DIAGNOSIS — Z8249 Family history of ischemic heart disease and other diseases of the circulatory system: Secondary | ICD-10-CM | POA: Diagnosis not present

## 2016-09-29 DIAGNOSIS — Z888 Allergy status to other drugs, medicaments and biological substances status: Secondary | ICD-10-CM | POA: Diagnosis not present

## 2016-09-29 DIAGNOSIS — Z96651 Presence of right artificial knee joint: Secondary | ICD-10-CM | POA: Diagnosis not present

## 2016-09-29 DIAGNOSIS — Z87891 Personal history of nicotine dependence: Secondary | ICD-10-CM

## 2016-09-29 DIAGNOSIS — I129 Hypertensive chronic kidney disease with stage 1 through stage 4 chronic kidney disease, or unspecified chronic kidney disease: Secondary | ICD-10-CM | POA: Diagnosis not present

## 2016-09-29 DIAGNOSIS — Z96652 Presence of left artificial knee joint: Secondary | ICD-10-CM | POA: Diagnosis not present

## 2016-09-29 DIAGNOSIS — M542 Cervicalgia: Secondary | ICD-10-CM | POA: Diagnosis not present

## 2016-09-29 DIAGNOSIS — I739 Peripheral vascular disease, unspecified: Secondary | ICD-10-CM

## 2016-09-29 DIAGNOSIS — I6529 Occlusion and stenosis of unspecified carotid artery: Secondary | ICD-10-CM | POA: Diagnosis present

## 2016-09-29 DIAGNOSIS — G8918 Other acute postprocedural pain: Secondary | ICD-10-CM | POA: Diagnosis not present

## 2016-09-29 DIAGNOSIS — Z951 Presence of aortocoronary bypass graft: Secondary | ICD-10-CM | POA: Diagnosis not present

## 2016-09-29 DIAGNOSIS — E1122 Type 2 diabetes mellitus with diabetic chronic kidney disease: Secondary | ICD-10-CM | POA: Diagnosis not present

## 2016-09-29 DIAGNOSIS — Z820 Family history of epilepsy and other diseases of the nervous system: Secondary | ICD-10-CM

## 2016-09-29 DIAGNOSIS — Z471 Aftercare following joint replacement surgery: Secondary | ICD-10-CM | POA: Diagnosis not present

## 2016-09-29 DIAGNOSIS — Z823 Family history of stroke: Secondary | ICD-10-CM | POA: Diagnosis not present

## 2016-09-29 DIAGNOSIS — M1711 Unilateral primary osteoarthritis, right knee: Principal | ICD-10-CM | POA: Diagnosis present

## 2016-09-29 DIAGNOSIS — Z825 Family history of asthma and other chronic lower respiratory diseases: Secondary | ICD-10-CM

## 2016-09-29 DIAGNOSIS — Z8711 Personal history of peptic ulcer disease: Secondary | ICD-10-CM | POA: Diagnosis not present

## 2016-09-29 DIAGNOSIS — I779 Disorder of arteries and arterioles, unspecified: Secondary | ICD-10-CM | POA: Diagnosis present

## 2016-09-29 DIAGNOSIS — Z7984 Long term (current) use of oral hypoglycemic drugs: Secondary | ICD-10-CM

## 2016-09-29 DIAGNOSIS — Z79899 Other long term (current) drug therapy: Secondary | ICD-10-CM | POA: Diagnosis not present

## 2016-09-29 DIAGNOSIS — E119 Type 2 diabetes mellitus without complications: Secondary | ICD-10-CM

## 2016-09-29 DIAGNOSIS — E785 Hyperlipidemia, unspecified: Secondary | ICD-10-CM | POA: Diagnosis not present

## 2016-09-29 DIAGNOSIS — J449 Chronic obstructive pulmonary disease, unspecified: Secondary | ICD-10-CM | POA: Diagnosis not present

## 2016-09-29 DIAGNOSIS — N183 Chronic kidney disease, stage 3 unspecified: Secondary | ICD-10-CM | POA: Diagnosis present

## 2016-09-29 DIAGNOSIS — Z809 Family history of malignant neoplasm, unspecified: Secondary | ICD-10-CM | POA: Diagnosis not present

## 2016-09-29 HISTORY — PX: TOTAL KNEE ARTHROPLASTY: SHX125

## 2016-09-29 LAB — GLUCOSE, CAPILLARY
GLUCOSE-CAPILLARY: 134 mg/dL — AB (ref 65–99)
GLUCOSE-CAPILLARY: 201 mg/dL — AB (ref 65–99)
GLUCOSE-CAPILLARY: 165 mg/dL — AB (ref 65–99)
Glucose-Capillary: 152 mg/dL — ABNORMAL HIGH (ref 65–99)

## 2016-09-29 SURGERY — ARTHROPLASTY, KNEE, TOTAL
Anesthesia: Monitor Anesthesia Care | Laterality: Right

## 2016-09-29 MED ORDER — FENTANYL CITRATE (PF) 100 MCG/2ML IJ SOLN
INTRAMUSCULAR | Status: AC
  Filled 2016-09-29: qty 2

## 2016-09-29 MED ORDER — BUPIVACAINE HCL (PF) 0.25 % IJ SOLN
INTRAMUSCULAR | Status: AC
  Filled 2016-09-29: qty 30

## 2016-09-29 MED ORDER — KETOROLAC TROMETHAMINE 30 MG/ML IJ SOLN
INTRAMUSCULAR | Status: DC | PRN
  Administered 2016-09-29: 30 mg

## 2016-09-29 MED ORDER — DOCUSATE SODIUM 100 MG PO CAPS: 100.0000 mg | ORAL_CAPSULE | Freq: Two times a day (BID) | ORAL | 0 refills | Status: DC

## 2016-09-29 MED ORDER — ONDANSETRON HCL 4 MG PO TABS: 4.0000 mg | ORAL_TABLET | Freq: Three times a day (TID) | ORAL | 0 refills | Status: DC | PRN

## 2016-09-29 MED ORDER — DIPHENHYDRAMINE HCL 12.5 MG/5ML PO ELIX: 12.5000 mg | ORAL_SOLUTION | ORAL | Status: DC | PRN

## 2016-09-29 MED ORDER — PANTOPRAZOLE SODIUM 40 MG PO TBEC
40.0000 mg | DELAYED_RELEASE_TABLET | Freq: Every day | ORAL | Status: DC
  Administered 2016-09-30: 40 mg via ORAL
  Filled 2016-09-29: qty 1

## 2016-09-29 MED ORDER — ACETAMINOPHEN 650 MG RE SUPP: 650.0000 mg | Freq: Four times a day (QID) | RECTAL | Status: DC | PRN

## 2016-09-29 MED ORDER — LACTATED RINGERS IV SOLN: INTRAVENOUS | Status: DC

## 2016-09-29 MED ORDER — LIDOCAINE 2% (20 MG/ML) 5 ML SYRINGE
INTRAMUSCULAR | Status: AC
  Filled 2016-09-29: qty 5

## 2016-09-29 MED ORDER — PROPOFOL 10 MG/ML IV BOLUS: INTRAVENOUS | Status: DC | PRN

## 2016-09-29 MED ORDER — DEXAMETHASONE SODIUM PHOSPHATE 10 MG/ML IJ SOLN
10.0000 mg | Freq: Once | INTRAMUSCULAR | Status: AC
  Administered 2016-09-30: 10 mg via INTRAVENOUS
  Filled 2016-09-29: qty 1

## 2016-09-29 MED ORDER — METOPROLOL TARTRATE 50 MG PO TABS
50.0000 mg | ORAL_TABLET | Freq: Two times a day (BID) | ORAL | Status: DC
  Administered 2016-09-29: 50 mg via ORAL
  Filled 2016-09-29 (×2): qty 1

## 2016-09-29 MED ORDER — METHOCARBAMOL 500 MG PO TABS: 500.0000 mg | ORAL_TABLET | Freq: Four times a day (QID) | ORAL | Status: DC | PRN

## 2016-09-29 MED ORDER — ACETAMINOPHEN 325 MG PO TABS: 650.0000 mg | ORAL_TABLET | Freq: Four times a day (QID) | ORAL | Status: DC | PRN

## 2016-09-29 MED ORDER — DOCUSATE SODIUM 100 MG PO CAPS
100.0000 mg | ORAL_CAPSULE | Freq: Two times a day (BID) | ORAL | Status: DC
Start: 2016-09-29 — End: 2016-09-30
  Administered 2016-09-29 – 2016-09-30 (×2): 100 mg via ORAL
  Filled 2016-09-29 (×2): qty 1

## 2016-09-29 MED ORDER — SENNA 8.6 MG PO TABS
1.0000 | ORAL_TABLET | Freq: Two times a day (BID) | ORAL | Status: DC
  Administered 2016-09-29 – 2016-09-30 (×2): 8.6 mg via ORAL
  Filled 2016-09-29 (×2): qty 1

## 2016-09-29 MED ORDER — MORPHINE SULFATE (PF) 2 MG/ML IV SOLN: 1.0000 mg | INTRAVENOUS | Status: DC | PRN

## 2016-09-29 MED ORDER — 0.9 % SODIUM CHLORIDE (POUR BTL) OPTIME
TOPICAL | Status: DC | PRN
  Administered 2016-09-29: 1000 mL

## 2016-09-29 MED ORDER — PHENOL 1.4 % MT LIQD: 1.0000 | OROMUCOSAL | Status: DC | PRN

## 2016-09-29 MED ORDER — EPHEDRINE SULFATE 50 MG/ML IJ SOLN
INTRAMUSCULAR | Status: DC | PRN
  Administered 2016-09-29: 5 mg via INTRAVENOUS

## 2016-09-29 MED ORDER — KETOROLAC TROMETHAMINE 30 MG/ML IJ SOLN
INTRAMUSCULAR | Status: AC
  Filled 2016-09-29: qty 1

## 2016-09-29 MED ORDER — METHOCARBAMOL 1000 MG/10ML IJ SOLN
500.0000 mg | Freq: Four times a day (QID) | INTRAMUSCULAR | Status: DC | PRN
  Filled 2016-09-29: qty 5

## 2016-09-29 MED ORDER — EPHEDRINE 5 MG/ML INJ
INTRAVENOUS | Status: AC
  Filled 2016-09-29: qty 10

## 2016-09-29 MED ORDER — METFORMIN HCL 500 MG PO TABS
500.0000 mg | ORAL_TABLET | Freq: Two times a day (BID) | ORAL | Status: DC
  Administered 2016-09-29 – 2016-09-30 (×2): 500 mg via ORAL
  Filled 2016-09-29 (×2): qty 1

## 2016-09-29 MED ORDER — OXYCODONE-ACETAMINOPHEN 5-325 MG PO TABS: 1.0000 | ORAL_TABLET | ORAL | 0 refills | Status: DC | PRN

## 2016-09-29 MED ORDER — SORBITOL 70 % SOLN: 30.0000 mL | Freq: Every day | Status: DC | PRN

## 2016-09-29 MED ORDER — ONDANSETRON HCL 4 MG PO TABS: 4.0000 mg | ORAL_TABLET | Freq: Four times a day (QID) | ORAL | Status: DC | PRN

## 2016-09-29 MED ORDER — MEPERIDINE HCL 25 MG/ML IJ SOLN: 6.2500 mg | INTRAMUSCULAR | Status: DC | PRN

## 2016-09-29 MED ORDER — LISINOPRIL 10 MG PO TABS
10.0000 mg | ORAL_TABLET | Freq: Every day | ORAL | Status: DC
  Administered 2016-09-29: 10 mg via ORAL
  Filled 2016-09-29: qty 1

## 2016-09-29 MED ORDER — METOCLOPRAMIDE HCL 5 MG PO TABS: 5.0000 mg | ORAL_TABLET | Freq: Three times a day (TID) | ORAL | Status: DC | PRN

## 2016-09-29 MED ORDER — PHENYLEPHRINE 40 MCG/ML (10ML) SYRINGE FOR IV PUSH (FOR BLOOD PRESSURE SUPPORT)
PREFILLED_SYRINGE | INTRAVENOUS | Status: AC
  Filled 2016-09-29: qty 10

## 2016-09-29 MED ORDER — MENTHOL 3 MG MT LOZG: 1.0000 | LOZENGE | OROMUCOSAL | Status: DC | PRN

## 2016-09-29 MED ORDER — SODIUM CHLORIDE 0.9 % IR SOLN
Status: DC | PRN
  Administered 2016-09-29: 3000 mL

## 2016-09-29 MED ORDER — BACLOFEN 10 MG PO TABS: 10.0000 mg | ORAL_TABLET | Freq: Three times a day (TID) | ORAL | 0 refills | Status: AC | PRN

## 2016-09-29 MED ORDER — PROMETHAZINE HCL 25 MG/ML IJ SOLN: 6.2500 mg | INTRAMUSCULAR | Status: DC | PRN

## 2016-09-29 MED ORDER — RIVAROXABAN 10 MG PO TABS
10.0000 mg | ORAL_TABLET | Freq: Every day | ORAL | Status: DC
  Administered 2016-09-30: 10 mg via ORAL
  Filled 2016-09-29: qty 1

## 2016-09-29 MED ORDER — PIOGLITAZONE HCL 30 MG PO TABS
30.0000 mg | ORAL_TABLET | Freq: Every day | ORAL | Status: DC
  Administered 2016-09-29: 30 mg via ORAL
  Filled 2016-09-29: qty 1

## 2016-09-29 MED ORDER — ONDANSETRON HCL 4 MG/2ML IJ SOLN
4.0000 mg | Freq: Four times a day (QID) | INTRAMUSCULAR | Status: DC | PRN
Start: 2016-09-29 — End: 2016-09-30

## 2016-09-29 MED ORDER — CEFAZOLIN SODIUM-DEXTROSE 2-4 GM/100ML-% IV SOLN
2.0000 g | Freq: Four times a day (QID) | INTRAVENOUS | Status: AC
  Administered 2016-09-29 (×2): 2 g via INTRAVENOUS
  Filled 2016-09-29 (×2): qty 100

## 2016-09-29 MED ORDER — SODIUM CHLORIDE FLUSH 0.9 % IV SOLN
INTRAVENOUS | Status: DC | PRN
  Administered 2016-09-29 (×2): 10 mL

## 2016-09-29 MED ORDER — LACTATED RINGERS IV SOLN
INTRAVENOUS | Status: DC
Start: 2016-09-29 — End: 2016-09-30

## 2016-09-29 MED ORDER — BUPIVACAINE IN DEXTROSE 0.75-8.25 % IT SOLN
INTRATHECAL | Status: DC | PRN
  Administered 2016-09-29: 2 mL via INTRATHECAL

## 2016-09-29 MED ORDER — FENTANYL CITRATE (PF) 100 MCG/2ML IJ SOLN
INTRAMUSCULAR | Status: DC | PRN
  Administered 2016-09-29 (×2): 50 ug via INTRAVENOUS

## 2016-09-29 MED ORDER — FENTANYL CITRATE (PF) 100 MCG/2ML IJ SOLN
25.0000 ug | INTRAMUSCULAR | Status: DC | PRN
  Administered 2016-09-29 (×4): 25 ug via INTRAVENOUS

## 2016-09-29 MED ORDER — OXYCODONE HCL 5 MG PO TABS
5.0000 mg | ORAL_TABLET | ORAL | Status: DC | PRN
  Administered 2016-09-29 – 2016-09-30 (×2): 5 mg via ORAL
  Filled 2016-09-29 (×2): qty 1

## 2016-09-29 MED ORDER — KETOROLAC TROMETHAMINE 15 MG/ML IJ SOLN
7.5000 mg | Freq: Four times a day (QID) | INTRAMUSCULAR | Status: AC
  Administered 2016-09-29 – 2016-09-30 (×4): 7.5 mg via INTRAVENOUS
  Filled 2016-09-29 (×4): qty 1

## 2016-09-29 MED ORDER — POLYETHYLENE GLYCOL 3350 17 G PO PACK: 17.0000 g | PACK | Freq: Every day | ORAL | Status: DC | PRN

## 2016-09-29 MED ORDER — BUPIVACAINE HCL (PF) 0.25 % IJ SOLN
INTRAMUSCULAR | Status: DC | PRN
  Administered 2016-09-29: 30 mL

## 2016-09-29 MED ORDER — RIVAROXABAN 10 MG PO TABS: 10.0000 mg | ORAL_TABLET | Freq: Every day | ORAL | 0 refills | Status: AC

## 2016-09-29 MED ORDER — ATORVASTATIN CALCIUM 40 MG PO TABS
40.0000 mg | ORAL_TABLET | Freq: Every day | ORAL | Status: DC
  Administered 2016-09-29 – 2016-09-30 (×2): 40 mg via ORAL
  Filled 2016-09-29 (×2): qty 1

## 2016-09-29 MED ORDER — ACETAMINOPHEN 325 MG PO TABS
650.0000 mg | ORAL_TABLET | Freq: Four times a day (QID) | ORAL | Status: AC
  Administered 2016-09-29 – 2016-09-30 (×4): 650 mg via ORAL
  Filled 2016-09-29 (×4): qty 2

## 2016-09-29 MED ORDER — METOCLOPRAMIDE HCL 5 MG/ML IJ SOLN: 5.0000 mg | Freq: Three times a day (TID) | INTRAMUSCULAR | Status: DC | PRN

## 2016-09-29 MED ORDER — MIDAZOLAM HCL 5 MG/5ML IJ SOLN
INTRAMUSCULAR | Status: DC | PRN
  Administered 2016-09-29: 2 mg via INTRAVENOUS

## 2016-09-29 MED ORDER — PROPOFOL 500 MG/50ML IV EMUL
INTRAVENOUS | Status: DC | PRN
  Administered 2016-09-29: 50 ug/kg/min via INTRAVENOUS

## 2016-09-29 MED ORDER — INSULIN ASPART 100 UNIT/ML ~~LOC~~ SOLN
0.0000 [IU] | SUBCUTANEOUS | Status: DC
  Administered 2016-09-29: 4 [IU] via SUBCUTANEOUS
  Administered 2016-09-29: 8 [IU] via SUBCUTANEOUS
  Administered 2016-09-30 (×3): 4 [IU] via SUBCUTANEOUS

## 2016-09-29 MED ORDER — MIDAZOLAM HCL 2 MG/2ML IJ SOLN
INTRAMUSCULAR | Status: AC
  Filled 2016-09-29: qty 2

## 2016-09-29 MED ORDER — PROPOFOL 10 MG/ML IV BOLUS
INTRAVENOUS | Status: AC
  Filled 2016-09-29: qty 20

## 2016-09-29 MED ORDER — PHENYLEPHRINE HCL 10 MG/ML IJ SOLN
INTRAMUSCULAR | Status: DC | PRN
  Administered 2016-09-29: 20 ug/min via INTRAVENOUS

## 2016-09-29 MED ORDER — PROPOFOL 10 MG/ML IV BOLUS
INTRAVENOUS | Status: AC
Start: 2016-09-29 — End: 2016-09-29
  Filled 2016-09-29: qty 20

## 2016-09-29 MED ORDER — LACTATED RINGERS IV SOLN
INTRAVENOUS | Status: DC | PRN
  Administered 2016-09-29 (×2): via INTRAVENOUS

## 2016-09-29 SURGICAL SUPPLY — 64 items
ADH SKN CLS APL DERMABOND .7 (GAUZE/BANDAGES/DRESSINGS)
BANDAGE ACE 6X5 VEL STRL LF (GAUZE/BANDAGES/DRESSINGS) ×1 IMPLANT
BANDAGE ESMARK 6X9 LF (GAUZE/BANDAGES/DRESSINGS) ×1 IMPLANT
BLADE CLIPPER SURG (BLADE) ×1 IMPLANT
BLADE SAG 18X100X1.27 (BLADE) ×2 IMPLANT
BNDG CMPR 9X6 STRL LF SNTH (GAUZE/BANDAGES/DRESSINGS) ×1
BNDG COHESIVE 6X5 TAN STRL LF (GAUZE/BANDAGES/DRESSINGS) ×2 IMPLANT
BNDG ESMARK 6X9 LF (GAUZE/BANDAGES/DRESSINGS) ×2
BOWL SMART MIX CTS (DISPOSABLE) ×2 IMPLANT
CAPT KNEE TOTAL 3 ×1 IMPLANT
CEMENT BONE SIMPLEX SPEEDSET (Cement) ×4 IMPLANT
CLSR STERI-STRIP ANTIMIC 1/2X4 (GAUZE/BANDAGES/DRESSINGS) ×3 IMPLANT
COVER SURGICAL LIGHT HANDLE (MISCELLANEOUS) ×2 IMPLANT
CUFF TOURNIQUET SINGLE 34IN LL (TOURNIQUET CUFF) ×2 IMPLANT
DERMABOND ADVANCED (GAUZE/BANDAGES/DRESSINGS)
DERMABOND ADVANCED .7 DNX12 (GAUZE/BANDAGES/DRESSINGS) IMPLANT
DRAPE EXTREMITY T 121X128X90 (DRAPE) ×1 IMPLANT
DRAPE HALF SHEET 40X57 (DRAPES) ×2 IMPLANT
DRAPE U-SHAPE 47X51 STRL (DRAPES) ×2 IMPLANT
DRSG ADAPTIC 3X8 NADH LF (GAUZE/BANDAGES/DRESSINGS) ×2 IMPLANT
DRSG MEPILEX BORDER 4X8 (GAUZE/BANDAGES/DRESSINGS) ×2 IMPLANT
DURAPREP 26ML APPLICATOR (WOUND CARE) ×4 IMPLANT
ELECT CAUTERY BLADE 6.4 (BLADE) ×2 IMPLANT
ELECT REM PT RETURN 9FT ADLT (ELECTROSURGICAL) ×2
ELECTRODE REM PT RTRN 9FT ADLT (ELECTROSURGICAL) ×1 IMPLANT
FACESHIELD WRAPAROUND (MASK) ×4 IMPLANT
FACESHIELD WRAPAROUND OR TEAM (MASK) ×2 IMPLANT
GAUZE SPONGE 4X4 12PLY STRL (GAUZE/BANDAGES/DRESSINGS) ×2 IMPLANT
GLOVE BIO SURGEON STRL SZ7.5 (GLOVE) ×4 IMPLANT
GLOVE BIOGEL PI IND STRL 8 (GLOVE) ×2 IMPLANT
GLOVE BIOGEL PI INDICATOR 8 (GLOVE) ×2
GOWN STRL REUS W/ TWL LRG LVL3 (GOWN DISPOSABLE) ×2 IMPLANT
GOWN STRL REUS W/ TWL XL LVL3 (GOWN DISPOSABLE) ×1 IMPLANT
GOWN STRL REUS W/TWL LRG LVL3 (GOWN DISPOSABLE) ×4
GOWN STRL REUS W/TWL XL LVL3 (GOWN DISPOSABLE) ×2
HANDPIECE INTERPULSE COAX TIP (DISPOSABLE) ×2
IMMOBILIZER KNEE 22 (SOFTGOODS) ×1 IMPLANT
IMMOBILIZER KNEE 22 UNIV (SOFTGOODS) ×2 IMPLANT
IMMOBILIZER KNEE 24 THIGH 36 (MISCELLANEOUS) IMPLANT
IMMOBILIZER KNEE 24 UNIV (MISCELLANEOUS)
KIT BASIN OR (CUSTOM PROCEDURE TRAY) ×2 IMPLANT
KIT ROOM TURNOVER OR (KITS) ×2 IMPLANT
MANIFOLD NEPTUNE II (INSTRUMENTS) ×2 IMPLANT
NDL 18GX1X1/2 (RX/OR ONLY) (NEEDLE) ×1 IMPLANT
NEEDLE 18GX1X1/2 (RX/OR ONLY) (NEEDLE) ×2 IMPLANT
NS IRRIG 1000ML POUR BTL (IV SOLUTION) ×2 IMPLANT
PACK TOTAL JOINT (CUSTOM PROCEDURE TRAY) ×2 IMPLANT
PACK UNIVERSAL I (CUSTOM PROCEDURE TRAY) ×2 IMPLANT
PAD ARMBOARD 7.5X6 YLW CONV (MISCELLANEOUS) ×2 IMPLANT
SET HNDPC FAN SPRY TIP SCT (DISPOSABLE) IMPLANT
STAPLER VISISTAT 35W (STAPLE) IMPLANT
SUCTION FRAZIER HANDLE 10FR (MISCELLANEOUS) ×1
SUCTION TUBE FRAZIER 10FR DISP (MISCELLANEOUS) ×1 IMPLANT
SUT MNCRL AB 4-0 PS2 18 (SUTURE) ×2 IMPLANT
SUT MON AB 2-0 CT1 27 (SUTURE) ×4 IMPLANT
SUT VIC AB 0 CT1 27 (SUTURE) ×2
SUT VIC AB 0 CT1 27XBRD ANBCTR (SUTURE) ×1 IMPLANT
SUT VIC AB 1 CT1 27 (SUTURE) ×4
SUT VIC AB 1 CT1 27XBRD ANBCTR (SUTURE) ×2 IMPLANT
SYR 50ML LL SCALE MARK (SYRINGE) ×2 IMPLANT
TOWEL OR 17X24 6PK STRL BLUE (TOWEL DISPOSABLE) ×2 IMPLANT
TOWEL OR 17X26 10 PK STRL BLUE (TOWEL DISPOSABLE) ×2 IMPLANT
TRAY CATH 16FR W/PLASTIC CATH (SET/KITS/TRAYS/PACK) ×1 IMPLANT
TRAY FOLEY BAG SILVER LF 14FR (CATHETERS) IMPLANT

## 2016-09-29 NOTE — Progress Notes (Signed)
Pt up to side of bed for lunch. And remains up on side of bed after meal.

## 2016-09-29 NOTE — Op Note (Signed)
DATE OF SURGERY:  09/29/2016 TIME: 9:29 AM  PATIENT NAME:  Brad Day   AGE: 76 y.o.    PRE-OPERATIVE DIAGNOSIS:  OA RIGHT KNEE  POST-OPERATIVE DIAGNOSIS:  Same  PROCEDURE:  Procedure(s): TOTAL KNEE ARTHROPLASTY   SURGEON:  Wilburn Keir D, MD   ASSISTANT:  Roxan Hockey, PA-C, he was present and scrubbed throughout the case, critical for completion in a timely fashion, and for retraction, instrumentation, and closure.    OPERATIVE IMPLANTS: Stryker Triathlon Posterior Stabilized.  Femur size 7, Tibia size 6, Patella size 35 3-peg oval button, with a 9 mm polyethylene insert.   PREOPERATIVE INDICATIONS:  Brad Day is a 76 y.o. year old male with end stage bone on bone degenerative arthritis of the knee who failed conservative treatment, including injections, antiinflammatories, activity modification, and assistive devices, and had significant impairment of their activities of daily living, and elected for Total Knee Arthroplasty.   The risks, benefits, and alternatives were discussed at length including but not limited to the risks of infection, bleeding, nerve injury, stiffness, blood clots, the need for revision surgery, cardiopulmonary complications, among others, and they were willing to proceed.   OPERATIVE DESCRIPTION:  The patient was brought to the operative room and placed in a supine position.  General anesthesia was administered.  IV antibiotics were given.  The lower extremity was prepped and draped in the usual sterile fashion.  Time out was performed.  The leg was elevated and exsanguinated and the tourniquet was inflated.  Anterior approach was performed.  The patella was everted and osteophytes were removed.  The anterior horn of the medial and lateral meniscus was removed.   The distal femur was opened with the drill and the intramedullary distal femoral cutting jig was utilized, set at 5 degrees resecting 10 mm off the distal femur.  Care was taken to  protect the collateral ligaments.  The distal femoral sizing jig was applied, taking care to avoid notching.  Then the 4-in-1 cutting jig was applied and the anterior and posterior femur was cut, along with the chamfer cuts.  All posterior osteophytes were removed.  The flexion gap was then measured and was symmetric with the extension gap.  Then the extramedullary tibial cutting jig was utilized making the appropriate cut using the anterior tibial crest as a reference building in appropriate posterior slope.  Care was taken during the cut to protect the medial and collateral ligaments.  The proximal tibia was removed along with the posterior horns of the menisci.  The PCL was sacrificed.    The extensor gap was measured and was approximately 66mm.    I completed the distal femoral preparation using the appropriate jig to prepare the box.  The patella was then measured, and cut with the saw.    The proximal tibia sized and prepared accordingly with the reamer and the punch, and then all components were trialed with the 75mm poly insert.  The knee was found to have excellent balance and full motion.    The above named components were then cemented into place and all excess cement was removed. Poly tibial piece and patella were inserted.  I was very happy with his stability and ROM  I performed a periarticular injection with marcaine and toradol  The knee was easily taken through a range of motion and the patella tracked well and the knee irrigated copiously and the parapatellar and subcutaneous tissue closed with vicryl, and monocryl with steri strips for the skin.  The incision was dressed with sterile gauze and the tourniquet released and the patient was awakened and returned to the PACU in stable and satisfactory condition.  There were no complications.  Total tourniquet time was roughly 80 minutes.   POSTOPERATIVE PLAN: post op Abx, DVT px: SCD's, TED's, Early ambulation and chemical  px

## 2016-09-29 NOTE — Anesthesia Procedure Notes (Signed)
Anesthesia Regional Block: Adductor canal block   Pre-Anesthetic Checklist: ,, timeout performed, Correct Patient, Correct Site, Correct Laterality, Correct Procedure, Correct Position, site marked, Risks and benefits discussed,  Surgical consent,  Pre-op evaluation,  At surgeon's request and post-op pain management  Laterality: Right  Prep: chloraprep       Needles:  Injection technique: Single-shot  Needle Type: Echogenic Needle     Needle Length: 9cm  Needle Gauge: 21     Additional Needles:   Procedures: ultrasound guided,,,,,,,,  Narrative:  Start time: 09/29/2016 7:10 AM End time: 09/29/2016 7:15 AM Injection made incrementally with aspirations every 5 mL.  Performed by: Personally  Anesthesiologist: Suella Broad D  Additional Notes: Tolerated well.

## 2016-09-29 NOTE — Discharge Instructions (Signed)
INSTRUCTIONS AFTER JOINT REPLACEMENT  ° °o Remove items at home which could result in a fall. This includes throw rugs or furniture in walking pathways °o ICE to the affected joint every three hours while awake for 30 minutes at a time, for at least the first 3-5 days, and then as needed for pain and swelling.  Continue to use ice for pain and swelling. You may notice swelling that will progress down to the foot and ankle.  This is normal after surgery.  Elevate your leg when you are not up walking on it.   °o Continue to use the breathing machine you got in the hospital (incentive spirometer) which will help keep your temperature down.  It is common for your temperature to cycle up and down following surgery, especially at night when you are not up moving around and exerting yourself.  The breathing machine keeps your lungs expanded and your temperature down. ° ° °DIET:  As you were doing prior to hospitalization, we recommend a well-balanced diet. ° °DRESSING / WOUND CARE / SHOWERING ° °Keep the surgical dressing until follow up. IF THE DRESSING FALLS OFF or the wound gets wet inside, change the dressing with sterile gauze.  Please use good hand washing techniques before changing the dressing.  Do not use any lotions or creams on the incision until instructed by your surgeon.   ° °ACTIVITY ° °o Increase activity slowly as tolerated, but follow the weight bearing instructions below.   °o No driving for 6 weeks or until further direction given by your physician.  You cannot drive while taking narcotics.  °o No lifting or carrying greater than 10 lbs. until further directed by your surgeon. °o Avoid periods of inactivity such as sitting longer than an hour when not asleep. This helps prevent blood clots.  °o You may return to work once you are authorized by your doctor.  ° ° ° °WEIGHT BEARING  ° °Weight bearing as tolerated with assist device (walker, cane, etc) as directed, use it as long as suggested by your  surgeon or therapist, typically at least 4-6 weeks. ° ° °EXERCISES ° °Results after joint replacement surgery are often greatly improved when you follow the exercise, range of motion and muscle strengthening exercises prescribed by your doctor. Safety measures are also important to protect the joint from further injury. Any time any of these exercises cause you to have increased pain or swelling, decrease what you are doing until you are comfortable again and then slowly increase them. If you have problems or questions, call your caregiver or physical therapist for advice.  ° °Rehabilitation is important following a joint replacement. After just a few days of immobilization, the muscles of the leg can become weakened and shrink (atrophy).  These exercises are designed to build up the tone and strength of the thigh and leg muscles and to improve motion. Often times heat used for twenty to thirty minutes before working out will loosen up your tissues and help with improving the range of motion but do not use heat for the first two weeks following surgery (sometimes heat can increase post-operative swelling).  ° °These exercises can be done on a training (exercise) mat, on the floor, on a table or on a bed. Use whatever works the best and is most comfortable for you.    Use music or television while you are exercising so that the exercises are a pleasant break in your day. This will make your life better   with the exercises acting as a break in your routine that you can look forward to.   Perform all exercises about fifteen times, three times per day or as directed.  You should exercise both the operative leg and the other leg as well. ° °Exercises include: °  °• Quad Sets - Tighten up the muscle on the front of the thigh (Quad) and hold for 5-10 seconds.   °• Straight Leg Raises - With your knee straight (if you were given a brace, keep it on), lift the leg to 60 degrees, hold for 3 seconds, and slowly lower the leg.   Perform this exercise against resistance later as your leg gets stronger.  °• Leg Slides: Lying on your back, slowly slide your foot toward your buttocks, bending your knee up off the floor (only go as far as is comfortable). Then slowly slide your foot back down until your leg is flat on the floor again.  °• Angel Wings: Lying on your back spread your legs to the side as far apart as you can without causing discomfort.  °• Hamstring Strength:  Lying on your back, push your heel against the floor with your leg straight by tightening up the muscles of your buttocks.  Repeat, but this time bend your knee to a comfortable angle, and push your heel against the floor.  You may put a pillow under the heel to make it more comfortable if necessary.  ° °A rehabilitation program following joint replacement surgery can speed recovery and prevent re-injury in the future due to weakened muscles. Contact your doctor or a physical therapist for more information on knee rehabilitation.  ° ° °CONSTIPATION ° °Constipation is defined medically as fewer than three stools per week and severe constipation as less than one stool per week.  Even if you have a regular bowel pattern at home, your normal regimen is likely to be disrupted due to multiple reasons following surgery.  Combination of anesthesia, postoperative narcotics, change in appetite and fluid intake all can affect your bowels.  ° °YOU MUST use at least one of the following options; they are listed in order of increasing strength to get the job done.  They are all available over the counter, and you may need to use some, POSSIBLY even all of these options:   ° °Drink plenty of fluids (prune juice may be helpful) and high fiber foods °Colace 100 mg by mouth twice a day  °Senokot for constipation as directed and as needed Dulcolax (bisacodyl), take with full glass of water  °Miralax (polyethylene glycol) once or twice a day as needed. ° °If you have tried all these things and  are unable to have a bowel movement in the first 3-4 days after surgery call either your surgeon or your primary doctor.   ° °If you experience loose stools or diarrhea, hold the medications until you stool forms back up.  If your symptoms do not get better within 1 week or if they get worse, check with your doctor.  If you experience "the worst abdominal pain ever" or develop nausea or vomiting, please contact the office immediately for further recommendations for treatment. ° ° °ITCHING:  If you experience itching with your medications, try taking only a single pain pill, or even half a pain pill at a time.  You can also use Benadryl over the counter for itching or also to help with sleep.  ° °TED HOSE STOCKINGS:  Use stockings on both legs until   for at least 2 weeks or as directed by physician office. They may be removed at night for sleeping.  MEDICATIONS:  See your medication summary on the After Visit Summary that nursing will review with you.  You may have some home medications which will be placed on hold until you complete the course of blood thinner medication.  It is important for you to complete the blood thinner medication as prescribed.  PRECAUTIONS:  If you experience chest pain or shortness of breath - call 911 immediately for transfer to the hospital emergency department.   If you develop a fever greater that 101 F, purulent drainage from wound, increased redness or drainage from wound, foul odor from the wound/dressing, or calf pain - CONTACT YOUR SURGEON.                                                   FOLLOW-UP APPOINTMENTS:  If you do not already have a post-op appointment, please call the office for an appointment to be seen by your surgeon.  Guidelines for how soon to be seen are listed in your After Visit Summary, but are typically between 1-4 weeks after surgery.  OTHER INSTRUCTIONS:   Knee Replacement:  Do not place pillow under knee, focus on keeping the knee straight  while resting. CPM instructions: 0-90 degrees, 2 hours in the morning, 2 hours in the afternoon, and 2 hours in the evening. Place foam block, curve side up under heel at all times except when in CPM or when walking.  DO NOT modify, tear, cut, or change the foam block in any way.  MAKE SURE YOU:   Understand these instructions.   Get help right away if you are not doing well or get worse.    Thank you for letting us be a part of your medical care team.  It is a privilege we respect greatly.  We hope these instructions will help you stay on track for a fast and full recovery!      Information on my medicine - XARELTO (Rivaroxaban)  This medication education was reviewed with me or my healthcare representative as part of my discharge preparation.  The pharmacist that spoke with me during my hospital stay was:  Saundra Shelling, [facility]  Why was Xarelto prescribed for you? Xarelto was prescribed for you to reduce the risk of blood clots forming after orthopedic surgery. The medical term for these abnormal blood clots is venous thromboembolism (VTE).  What do you need to know about xarelto ? Take your Xarelto ONCE DAILY at the same time every day. You may take it either with or without food.  If you have difficulty swallowing the tablet whole, you may crush it and mix in applesauce just prior to taking your dose.  Take Xarelto exactly as prescribed by your doctor and DO NOT stop taking Xarelto without talking to the doctor who prescribed the medication.  Stopping without other VTE prevention medication to take the place of Xarelto may increase your risk of developing a clot.  After discharge, you should have regular check-up appointments with your healthcare provider that is prescribing your Xarelto.    What do you do if you miss a dose? If you miss a dose, take it as soon as you remember on the same day then continue your regularly scheduled once daily  regimen the next day. Do not  take two doses of Xarelto on the same day.   Important Safety Information A possible side effect of Xarelto is bleeding. You should call your healthcare provider right away if you experience any of the following: ? Bleeding from an injury or your nose that does not stop. ? Unusual colored urine (red or dark brown) or unusual colored stools (red or black). ? Unusual bruising for unknown reasons. ? A serious fall or if you hit your head (even if there is no bleeding).  Some medicines may interact with Xarelto and might increase your risk of bleeding while on Xarelto. To help avoid this, consult your healthcare provider or pharmacist prior to using any new prescription or non-prescription medications, including herbals, vitamins, non-steroidal anti-inflammatory drugs (NSAIDs) and supplements.  This website has more information on Xarelto: [email or web address]

## 2016-09-29 NOTE — Interval H&P Note (Signed)
History and Physical Interval Note:  09/29/2016 7:16 AM  Brad Day  has presented today for surgery, with the diagnosis of OA RIGHT KNEE  The various methods of treatment have been discussed with the patient and family. After consideration of risks, benefits and other options for treatment, the patient has consented to  Procedure(s): TOTAL KNEE ARTHROPLASTY (Right) as a surgical intervention .  The patient's history has been reviewed, patient examined, no change in status, stable for surgery.  I have reviewed the patient's chart and labs.  Questions were answered to the patient's satisfaction.     Kipp Shank D

## 2016-09-29 NOTE — Transfer of Care (Signed)
Immediate Anesthesia Transfer of Care Note  Patient: Navon BEAUMONT AUSTAD  Procedure(s) Performed: Procedure(s): TOTAL KNEE ARTHROPLASTY (Right)  Patient Location: PACU  Anesthesia Type:MAC, Regional and Spinal  Level of Consciousness: awake, alert , oriented and patient cooperative  Airway & Oxygen Therapy: Patient Spontanous Breathing and Patient connected to nasal cannula oxygen  Post-op Assessment: Report given to RN and Post -op Vital signs reviewed and stable  Post vital signs: Reviewed and stable  Last Vitals:  Vitals:   09/29/16 0549  BP: (!) 124/58  Pulse: 60  Resp: 20  Temp: 37 C    Last Pain:  Vitals:   09/29/16 0549  TempSrc: Oral      Patients Stated Pain Goal: 3 (90/30/09 2330)  Complications: No apparent anesthesia complications

## 2016-09-29 NOTE — Evaluation (Signed)
Physical Therapy Evaluation Patient Details Name: Brad Day MRN: [id number] DOB: 1940-08-01 Today's Date: 09/29/2016   History of Present Illness  76 y.o. male admitted to [facility] on 09/29/16 for elective R TKA.  Pt with significant PMHx of L TKA, stroke, SOB, HTN, DM, fall, CAD, carotid artery occlusion s/p CEA L, and CABG.  Clinical Impression  Pt is POD #0 and mobilizing very well.  He was min guard assist overall and was able to walk an impressive distance down the hallway.  He will likely progress quickly.   PT to follow acutely for deficits listed below.       Follow Up Recommendations Home health PT;Supervision for mobility/OOB    Equipment Recommendations  None recommended by PT    Recommendations for Other Services   NA    Precautions / Restrictions Precautions Precautions: Knee Precaution Booklet Issued: Yes (comment) Precaution Comments: knee exercise handout given Restrictions Weight Bearing Restrictions: Yes RLE Weight Bearing: Weight bearing as tolerated      Mobility  Bed Mobility               General bed mobility comments: Pt is seated EOB when PT entered the room.   Transfers Overall transfer level: Needs assistance Equipment used: Rolling walker (2 wheeled) Transfers: Sit to/from Stand Sit to Stand: Min guard         General transfer comment: Min guard assist for safety, verbal cues for safe hand placement.   Ambulation/Gait Ambulation/Gait assistance: Min guard Ambulation Distance (Feet): 200 Feet Assistive device: Rolling walker (2 wheeled) Gait Pattern/deviations: Step-through pattern;Antalgic Gait velocity: decreased Gait velocity interpretation: Below normal speed for age/gender General Gait Details: Very mildly antalgic gait pattern with good right knee stability (no buckling).  Pt needed cues to not park RW and take a few steps away and to stay inside of RW handles during gait.          Balance Overall balance assessment: Needs  assistance Sitting-balance support: Feet supported;No upper extremity supported Sitting balance-[name]: Good     Standing balance support: Bilateral upper extremity supported Standing balance-[name]: Fair                               Pertinent Vitals/Pain Pain Assessment: 0-10 Pain Score: 3  Pain Location: right knee Pain Descriptors / Indicators: Aching;Burning Pain Intervention(s): Limited activity within patient's tolerance;Monitored during session;Repositioned    Home Living Family/patient expects to be discharged to:: Private residence Living Arrangements: Spouse/significant other Available Help at Discharge: Family;Available 24 hours/day Type of Home: House Home Access: Stairs to enter Entrance Stairs-Rails: Can reach both Entrance Stairs-Number of Steps: 3 Home Layout: One level Home Equipment: Walker - 2 wheels;Cane - single point;Bedside commode;Shower seat      Prior Function Level of Independence: Independent               Hand Dominance   Dominant Hand: Right    Extremity/Trunk Assessment   Upper Extremity Assessment Upper Extremity Assessment: Defer to OT evaluation    Lower Extremity Assessment Lower Extremity Assessment: RLE deficits/detail RLE Deficits / Details: right leg with normal post op pain and weakness, ankle at least 3/5, knee 2-/5, hip flexion 3-/5 RLE Sensation:  (intact to LT)    Cervical / Trunk Assessment Cervical / Trunk Assessment: Other exceptions Cervical / Trunk Exceptions: pt reports h/o lumbar spine issues after a fall and has had injections, but no surgery.  Communication   Communication: No difficulties  Cognition Arousal/Alertness: Awake/alert Behavior During Therapy: WFL for tasks assessed/performed Overall Cognitive Status: Within Functional Limits for tasks assessed                                           Exercises Total Joint Exercises Ankle Circles/Pumps:  AROM;Both;20 reps Quad Sets: AROM;Right;10 reps Towel Squeeze: AROM;Both;10 reps Heel Slides: AAROM;Right;10 reps   Assessment/Plan    PT Assessment Patient needs continued PT services  PT Problem List Decreased strength;Decreased range of motion;Decreased activity tolerance;Decreased balance;Decreased mobility;Decreased knowledge of use of DME;Pain;Decreased knowledge of precautions       PT Treatment Interventions DME instruction;Gait training;Stair training;Functional mobility training;Therapeutic activities;Therapeutic exercise;Balance training;Patient/family education;Manual techniques;Modalities    PT Goals (Current goals can be found in the Care Plan section)  Acute Rehab PT Goals Patient Stated Goal: 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  PT Goal Formulation: With patient Time For Goal Achievement: 10/06/16 Potential to Achieve Goals: Good    Frequency 7X/week           End of Session   Activity Tolerance: Patient tolerated treatment well Patient left: in chair;with call bell/phone within reach;with family/visitor present Nurse Communication: Mobility status PT Visit Diagnosis: Difficulty in walking, not elsewhere classified (R26.2);Pain;Muscle weakness (generalized) (M62.81) Pain - Right/Left: Right Pain - part of body: Knee    Time: 1631-1700 PT Time Calculation (min) (ACUTE ONLY): 29 min   Charges:   PT Evaluation $PT Eval Moderate Complexity: 1 Procedure PT Treatments $Gait Training: 8-22 mins         Annica Marinello B. [place], [place], DPT [phone]   09/29/2016, 5:09 PM

## 2016-09-29 NOTE — Anesthesia Procedure Notes (Signed)
Spinal  Patient location during procedure: OR Start time: 09/29/2016 7:44 AM End time: 09/29/2016 7:48 AM Staffing Anesthesiologist: Suella Broad D Preanesthetic Checklist Completed: patient identified, site marked, surgical consent, pre-op evaluation, timeout performed, IV checked, risks and benefits discussed and monitors and equipment checked Spinal Block Patient position: sitting Prep: Betadine Patient monitoring: heart rate, continuous pulse ox, blood pressure and cardiac monitor Approach: midline Location: L4-5 Injection technique: single-shot Needle Needle type: Whitacre and Introducer  Needle gauge: 24 G Needle length: 9 cm Additional Notes Negative paresthesia. Negative blood return. Positive free-flowing CSF. Expiration date of kit checked and confirmed. Patient tolerated procedure well, without complications.

## 2016-09-29 NOTE — Anesthesia Postprocedure Evaluation (Addendum)
Anesthesia Post Note  Patient: Brad Day  Procedure(s) Performed: Procedure(s) (LRB): TOTAL KNEE ARTHROPLASTY (Right)  Patient location during evaluation: PACU Anesthesia Type: Regional Level of consciousness: oriented and awake and alert Pain management: pain level controlled Vital Signs Assessment: post-procedure vital signs reviewed and stable Respiratory status: spontaneous breathing, respiratory function stable and patient connected to nasal cannula oxygen Cardiovascular status: blood pressure returned to baseline and stable Postop Assessment: no headache and no backache Anesthetic complications: no       Last Vitals:  Vitals:   09/29/16 1315 09/29/16 1321  BP: 134/70   Pulse: 64   Resp: 15   Temp:  36.3 C    Last Pain:  Vitals:   09/29/16 1020  TempSrc:   PainSc: 0-No pain                 Effie Berkshire

## 2016-09-29 NOTE — Progress Notes (Signed)
Orthopedic Tech Progress Note Patient Details:  Brad Day 20-Jan-1941 [id number]  CPM Right Knee Right Knee Flexion (Degrees): 90 Right Knee Extension (Degrees): 0   [name] 09/29/2016, 12:02 PM ohf not applied because pt's weight exceeds durability of frame; RN notified

## 2016-09-30 ENCOUNTER — Encounter (HOSPITAL_COMMUNITY): Payer: Self-pay | Admitting: Orthopedic Surgery

## 2016-09-30 LAB — GLUCOSE, CAPILLARY
Glucose-Capillary: 170 mg/dL — ABNORMAL HIGH (ref 65–99)
Glucose-Capillary: 195 mg/dL — ABNORMAL HIGH (ref 65–99)

## 2016-09-30 NOTE — Progress Notes (Signed)
Reviewed discharged instructions and RX with pt and wife verb understanding. Pt left via wheelchair by volunteer services. Pt in NAD on discharge. Pain minimal.

## 2016-09-30 NOTE — Progress Notes (Signed)
Occupational Therapy Evaluation Patient Details Name: Brad Day MRN: [id number] DOB: 1940/11/16 Today's Date: 09/30/2016    History of Present Illness 76 y.o. male admitted to [facility] on 09/29/16 for elective R TKA.  Pt with significant PMHx of L TKA, stroke, SOB, HTN, DM, fall, CAD, carotid artery occlusion s/p CEA L, and CABG.   Clinical Impression   All education regarding compensatory techniques for ADL, reducing risk of falls and shower transfer technique completed. Pt/wfie demonstrated understanding.     Follow Up Recommendations  No OT follow up;Supervision - Intermittent (S initially with mobility and ADL)    Equipment Recommendations  None recommended by OT    Recommendations for Other Services       Precautions / Restrictions Precautions Precautions: Knee Precaution Booklet Issued: Yes (comment) Restrictions Weight Bearing Restrictions: Yes RLE Weight Bearing: Weight bearing as tolerated      Mobility Bed Mobility               General bed mobility comments: Pt is seated EOB when PT entered the room.   Transfers Overall transfer level: Needs assistance Equipment used: Rolling walker (2 wheeled) Transfers: Sit to/from Stand Sit to Stand: Min guard         General transfer comment: Min guard assist for safety, verbal cues for safe hand placement.     Balance Overall balance assessment: Needs assistance Sitting-balance support: Feet supported;No upper extremity supported Sitting balance-[name]: Good     Standing balance support: Bilateral upper extremity supported Standing balance-[name]: Fair                             ADL either performed or assessed with clinical judgement   ADL Overall ADL's : Needs assistance/impaired                                     Functional mobility during ADLs: Min guard;Rolling walker;Cueing for safety General ADL Comments: Pt overall min A for LB ADL. Educated pt/wife on  compensatory techniques for ADL. Educated pt/wife on walk in shower transfer technique. Pt able to return demonstrate with min vc. Recommend S with all ADL after DC. Wife verbalized understnading. Educated on reducing risk of falls.      Vision         Perception     Praxis      Pertinent Vitals/Pain Pain Assessment: 0-10 Pain Score: 5  Pain Location: right knee Pain Descriptors / Indicators: Aching;Burning Pain Intervention(s): Limited activity within patient's tolerance;Ice applied     Hand Dominance Right   Extremity/Trunk Assessment Upper Extremity Assessment Upper Extremity Assessment: Overall WFL for tasks assessed   Lower Extremity Assessment Lower Extremity Assessment: Defer to PT evaluation RLE Sensation:  (intact to LT)   Cervical / Trunk Assessment Cervical / Trunk Assessment: Other exceptions Cervical / Trunk Exceptions: pt reports h/o lumbar spine issues after a fall and has had injections, but no surgery.     Communication Communication Communication: No difficulties   Cognition Arousal/Alertness: Awake/alert Behavior During Therapy: WFL for tasks assessed/performed Overall Cognitive Status: Within Functional Limits for tasks assessed                                     General Comments  Exercises     Shoulder Instructions      Home Living Family/patient expects to be discharged to:: Private residence Living Arrangements: Spouse/significant other Available Help at Discharge: Family;Available 24 hours/day Type of Home: House Home Access: Stairs to enter [employer] of Steps: 3 Entrance Stairs-Rails: Can reach both Home Layout: One level     Bathroom Shower/Tub: Walk-in shower;Tub/shower unit   Bathroom Toilet: Standard Bathroom Accessibility: Yes How Accessible: Accessible via walker Home Equipment: [place] - 2 wheels;Cane - single point;Bedside commode;Shower seat          Prior Functioning/Environment  Level of Independence: Independent        Comments: pt occasionally using SPC during ambulation        OT Problem List:        OT Treatment/Interventions:      OT Goals(Current goals can be found in the care plan section) Acute Rehab OT Goals Patient Stated Goal: 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  OT Goal Formulation: All assessment and education complete, DC therapy  OT Frequency:     Barriers to D/C:            Co-evaluation              End of Session Equipment Utilized During Treatment: Gait belt;Rolling walker CPM Right Knee CPM Right Knee: Off Nurse Communication: Mobility status  Activity Tolerance: Patient tolerated treatment well Patient left: in chair;with call bell/phone within reach;with family/visitor present  OT Visit Diagnosis: Unsteadiness on feet (R26.81);Pain Pain - Right/Left: Right Pain - part of body: Knee                Time: 7517-0017 OT Time Calculation (min): 12 min Charges:  OT General Charges $OT Visit: 1 Procedure OT Evaluation $OT Eval Low Complexity: 1 Procedure G-Codes:     Ashlynd Michna, OT/L  [phone] 09/30/2016  Brad Day,HILLARY 09/30/2016, 11:30 AM

## 2016-09-30 NOTE — Progress Notes (Signed)
   Assessment: 1 Day Post-Op  S/P Procedure(s) (LRB): TOTAL KNEE ARTHROPLASTY (Right) by Dr. Ernesta Amble. Percell Miller on 09/29/16  Principal Problem:   Primary osteoarthritis of right knee Active Problems:   Occlusion and stenosis of carotid artery   Bilateral carotid artery disease (HCC)   History of CVA (cerebrovascular accident) without residual deficits   History of coronary artery bypass, five   Diabetes mellitus type 2, controlled ([place])   CKD (chronic kidney disease) stage 3, GFR 30-59 ml/min  Progressing quickly.  OOB walking in hall several times with and w/o therapy.  Desires discharge this morning.  Plan: Up with therapy D/C IV fluids Discharge home with home health  Weight Bearing: Weight Bearing as Tolerated (WBAT)  Dressings: Mepilex.  VTE prophylaxis: Xarelto, SCDs, ambulation Dispo: Home  Subjective: Patient reports pain as mild to moderate. Pain controlled with PO meds.  Tolerating diet.  Urinating.  +Flatus.  No CP, SOB.   OOB several times.  Objective:   VITALS:   Vitals:   09/29/16 1400 09/29/16 1900 09/30/16 0020 09/30/16 0407  BP: 132/69 114/61 118/60 (!) 107/53  Pulse: 65 60 68 69  Resp: 20 20 20 20   Temp: 97.7 F (36.5 C) 98.1 F (36.7 C) 98 F (36.7 C) 99 F (37.2 C)  TempSrc: Oral Oral Oral Oral  SpO2: 100% 99% 99% 96%  Weight:       CBC Latest Ref Rng & Units 09/18/2016 03/23/2016 03/22/2016  WBC 4.0 - 10.5 K/uL 9.2 11.1(H) 14.3(H)  Hemoglobin 13.0 - 17.0 g/dL 13.1 10.7(L) 10.8(L)  Hematocrit 39.0 - 52.0 % 41.0 34.2(L) 33.1(L)  Platelets 150 - 400 K/uL 189 272 308   BMP Latest Ref Rng & Units 09/18/2016 03/24/2016 03/23/2016  Glucose 65 - 99 mg/dL 133(H) 293(H) 292(H)  BUN 6 - 20 mg/dL 22(H) 21(H) 16  Creatinine 0.61 - 1.24 mg/dL 1.63(H) 1.57(H) 1.54(H)  Sodium 135 - 145 mmol/L 142 137 137  Potassium 3.5 - 5.1 mmol/L 4.5 4.3 4.2  Chloride 101 - 111 mmol/L 110 103 103  CO2 22 - 32 mmol/L 25 24 24   Calcium 8.9 - 10.3 mg/dL 9.8 9.4 9.6    Intake/Output      03/27 0701 - 03/28 0700 03/28 0701 - 03/29 0700   P.O. 660    I.V. (mL/kg) 1910 (16.1)    Total Intake(mL/kg) 2570 (21.6)    Urine (mL/kg/hr) 750 (0.3)    Blood 100 (0)    Total Output 850     Net +1720          Urine Occurrence 5 x      Physical Exam: General: NAD.  Upright in chair.  Wife in room. Resp: No increased wob Cardio: regular rate and rhythm ABD soft Neurologically intact MSK Moderate expected LE swelling.  Ace wrap in place. Neurovascularly intact Sensation intact distally Intact pulses distally Dorsiflexion/Plantar flexion intact Incision: dressing C/D/I   Prudencio Burly III, PA-C 09/30/2016, 7:23 AM

## 2016-09-30 NOTE — Discharge Summary (Signed)
Discharge Summary  Patient ID: ROMAIN ERION MRN: [id number] DOB/AGE: January 18, 1941 76 y.o.  Admit date: 09/29/2016 Discharge date: 09/30/2016  Admission Diagnoses:  Primary osteoarthritis of right knee  Discharge Diagnoses:  Principal Problem:   Primary osteoarthritis of right knee Active Problems:   Occlusion and stenosis of carotid artery   Bilateral carotid artery disease (HCC)   History of CVA (cerebrovascular accident) without residual deficits   History of coronary artery bypass, five   Diabetes mellitus type 2, controlled (HCC)   CKD (chronic kidney disease) stage 3, GFR 30-59 ml/min   Past Medical History:  Diagnosis Date  . Arthritis   . Carotid artery occlusion   . Coronary artery disease   . Diabetes mellitus without complication ([place])   . Fall down embankment Oct. 26, 2016   Hurt Right shoulder  . GERD (gastroesophageal reflux disease)   . History of bleeding ulcers   . Hypertension   . Shortness of breath dyspnea    with exertion  . Stroke [facility])    4 mini strokes    Surgeries: Procedure(s): TOTAL KNEE ARTHROPLASTY on 09/29/2016   Consultants (if any):   Discharged Condition: Improved  Hospital Course: Tomio GAVINN COLLARD is an 76 y.o. male who was admitted 09/29/2016 with a diagnosis of Primary osteoarthritis of right knee and went to the operating room on 09/29/2016 and underwent the above named procedures.    He was given perioperative antibiotics:  Anti-infectives    Start     Dose/Rate Route Frequency Ordered Stop   09/29/16 1430  ceFAZolin (ANCEF) IVPB 2g/100 mL premix     2 g 200 mL/hr over 30 Minutes Intravenous Every 6 hours 09/29/16 1351 09/29/16 2250   09/29/16 0700  ceFAZolin (ANCEF) 3 g in dextrose 5 % 50 mL IVPB     3 g 130 mL/hr over 30 Minutes Intravenous To [facility]Stay [facility] 09/28/16 1201 09/29/16 0750    .  He was given sequential compression devices, early ambulation, and Xarelto for DVT prophylaxis.  He benefited maximally from the  hospital stay and there were no complications.    Recent vital signs:  Vitals:   09/30/16 0020 09/30/16 0407  BP: 118/60 (!) 107/53  Pulse: 68 69  Resp: 20 20  Temp: 98 F (36.7 C) 99 F (37.2 C)    Recent laboratory studies:  Lab Results  Component Value Date   HGB 13.1 09/18/2016   HGB 10.7 (L) 03/23/2016   HGB 10.8 (L) 03/22/2016   Lab Results  Component Value Date   WBC 9.2 09/18/2016   PLT 189 09/18/2016   Lab Results  Component Value Date   INR 1.21 03/22/2016   Lab Results  Component Value Date   NA 142 09/18/2016   K 4.5 09/18/2016   CL 110 09/18/2016   CO2 25 09/18/2016   BUN 22 (H) 09/18/2016   CREATININE 1.63 (H) 09/18/2016   GLUCOSE 133 (H) 09/18/2016    Discharge Medications:   Allergies as of 09/30/2016      Reactions   Asa [aspirin] Other (See Comments)   Causes bleeding Ulcer      Medication List    STOP taking these medications   acetaminophen 500 MG tablet Commonly known as:  TYLENOL   cefpodoxime 200 MG tablet Commonly known as:  VANTIN   cyclobenzaprine 5 MG tablet Commonly known as:  FLEXERIL   methylPREDNISolone 4 MG Tbpk tablet Commonly known as:  MEDROL DOSEPAK     TAKE these medications  atorvastatin 40 MG tablet Commonly known as:  LIPITOR Take 40 mg by mouth daily.   baclofen 10 MG tablet Commonly known as:  LIORESAL Take 1 tablet (10 mg total) by mouth 3 (three) times daily as needed for muscle spasms.   docusate sodium 100 MG capsule Commonly known as:  COLACE Take 1 capsule (100 mg total) by mouth 2 (two) times daily. To prevent constipation while taking pain medication.   Fish Oil 1200 MG Caps Take 1,200 mg by mouth 2 (two) times daily.   lisinopril 10 MG tablet Commonly known as:  PRINIVIL,ZESTRIL Take 10 mg by mouth daily at 8 pm.   metFORMIN 500 MG tablet Commonly known as:  GLUCOPHAGE Take 1 tablet (500 mg total) by mouth 2 (two) times daily with a meal.   metoprolol 50 MG tablet Commonly known  as:  LOPRESSOR Take 50 mg by mouth 2 (two) times daily.   ondansetron 4 MG tablet Commonly known as:  ZOFRAN Take 1 tablet (4 mg total) by mouth every 8 (eight) hours as needed for nausea or vomiting.   oxyCODONE-acetaminophen 5-325 MG tablet Commonly known as:  ROXICET Take 1-2 tablets by mouth every 4 (four) hours as needed for severe pain. What changed:  how much to take   pantoprazole 40 MG tablet Commonly known as:  PROTONIX Take 40 mg by mouth daily.   pioglitazone 15 MG tablet Commonly known as:  ACTOS Take 30 mg by mouth daily at 8 pm.   rivaroxaban 10 MG Tabs tablet Commonly known as:  XARELTO Take 1 tablet (10 mg total) by mouth daily. For 30 days for DVT prophylaxis What changed:  additional instructions       Diagnostic Studies: Dg Knee Right Port  Result Date: 09/29/2016 CLINICAL DATA:  Status post right knee replacement EXAM: PORTABLE RIGHT KNEE - 1-2 VIEW COMPARISON:  None. FINDINGS: Right knee replacement is noted. No acute bony or soft tissue abnormality is noted. IMPRESSION: Status post right knee replacement. Electronically Signed   By: Inez Catalina M.D.   On: 09/29/2016 11:24    Disposition: 01-Home or Self Care    Follow-up Information    MURPHY, TIMOTHY D, MD Follow up.   Specialty:  Orthopedic Surgery Contact information: [address] [phone]            Signed: Prudencio Burly III PA-C 09/30/2016, 7:28 AM

## 2016-10-02 DIAGNOSIS — J449 Chronic obstructive pulmonary disease, unspecified: Secondary | ICD-10-CM | POA: Diagnosis not present

## 2016-10-02 DIAGNOSIS — M4302 Spondylolysis, cervical region: Secondary | ICD-10-CM | POA: Diagnosis not present

## 2016-10-02 DIAGNOSIS — I129 Hypertensive chronic kidney disease with stage 1 through stage 4 chronic kidney disease, or unspecified chronic kidney disease: Secondary | ICD-10-CM | POA: Diagnosis not present

## 2016-10-02 DIAGNOSIS — E1122 Type 2 diabetes mellitus with diabetic chronic kidney disease: Secondary | ICD-10-CM | POA: Diagnosis not present

## 2016-10-02 DIAGNOSIS — N183 Chronic kidney disease, stage 3 (moderate): Secondary | ICD-10-CM | POA: Diagnosis not present

## 2016-10-02 DIAGNOSIS — Z471 Aftercare following joint replacement surgery: Secondary | ICD-10-CM | POA: Diagnosis not present

## 2016-10-05 DIAGNOSIS — N183 Chronic kidney disease, stage 3 (moderate): Secondary | ICD-10-CM | POA: Diagnosis not present

## 2016-10-05 DIAGNOSIS — M4302 Spondylolysis, cervical region: Secondary | ICD-10-CM | POA: Diagnosis not present

## 2016-10-05 DIAGNOSIS — J449 Chronic obstructive pulmonary disease, unspecified: Secondary | ICD-10-CM | POA: Diagnosis not present

## 2016-10-05 DIAGNOSIS — I129 Hypertensive chronic kidney disease with stage 1 through stage 4 chronic kidney disease, or unspecified chronic kidney disease: Secondary | ICD-10-CM | POA: Diagnosis not present

## 2016-10-05 DIAGNOSIS — Z471 Aftercare following joint replacement surgery: Secondary | ICD-10-CM | POA: Diagnosis not present

## 2016-10-05 DIAGNOSIS — E1122 Type 2 diabetes mellitus with diabetic chronic kidney disease: Secondary | ICD-10-CM | POA: Diagnosis not present

## 2016-10-08 DIAGNOSIS — M4302 Spondylolysis, cervical region: Secondary | ICD-10-CM | POA: Diagnosis not present

## 2016-10-08 DIAGNOSIS — I129 Hypertensive chronic kidney disease with stage 1 through stage 4 chronic kidney disease, or unspecified chronic kidney disease: Secondary | ICD-10-CM | POA: Diagnosis not present

## 2016-10-08 DIAGNOSIS — J449 Chronic obstructive pulmonary disease, unspecified: Secondary | ICD-10-CM | POA: Diagnosis not present

## 2016-10-08 DIAGNOSIS — N183 Chronic kidney disease, stage 3 (moderate): Secondary | ICD-10-CM | POA: Diagnosis not present

## 2016-10-08 DIAGNOSIS — E1122 Type 2 diabetes mellitus with diabetic chronic kidney disease: Secondary | ICD-10-CM | POA: Diagnosis not present

## 2016-10-08 DIAGNOSIS — Z471 Aftercare following joint replacement surgery: Secondary | ICD-10-CM | POA: Diagnosis not present

## 2016-10-09 DIAGNOSIS — E1122 Type 2 diabetes mellitus with diabetic chronic kidney disease: Secondary | ICD-10-CM | POA: Diagnosis not present

## 2016-10-09 DIAGNOSIS — I129 Hypertensive chronic kidney disease with stage 1 through stage 4 chronic kidney disease, or unspecified chronic kidney disease: Secondary | ICD-10-CM | POA: Diagnosis not present

## 2016-10-09 DIAGNOSIS — N183 Chronic kidney disease, stage 3 (moderate): Secondary | ICD-10-CM | POA: Diagnosis not present

## 2016-10-09 DIAGNOSIS — M4302 Spondylolysis, cervical region: Secondary | ICD-10-CM | POA: Diagnosis not present

## 2016-10-09 DIAGNOSIS — Z471 Aftercare following joint replacement surgery: Secondary | ICD-10-CM | POA: Diagnosis not present

## 2016-10-09 DIAGNOSIS — J449 Chronic obstructive pulmonary disease, unspecified: Secondary | ICD-10-CM | POA: Diagnosis not present

## 2016-10-12 DIAGNOSIS — J449 Chronic obstructive pulmonary disease, unspecified: Secondary | ICD-10-CM | POA: Diagnosis not present

## 2016-10-12 DIAGNOSIS — M4302 Spondylolysis, cervical region: Secondary | ICD-10-CM | POA: Diagnosis not present

## 2016-10-12 DIAGNOSIS — I129 Hypertensive chronic kidney disease with stage 1 through stage 4 chronic kidney disease, or unspecified chronic kidney disease: Secondary | ICD-10-CM | POA: Diagnosis not present

## 2016-10-12 DIAGNOSIS — N183 Chronic kidney disease, stage 3 (moderate): Secondary | ICD-10-CM | POA: Diagnosis not present

## 2016-10-12 DIAGNOSIS — Z471 Aftercare following joint replacement surgery: Secondary | ICD-10-CM | POA: Diagnosis not present

## 2016-10-12 DIAGNOSIS — E1122 Type 2 diabetes mellitus with diabetic chronic kidney disease: Secondary | ICD-10-CM | POA: Diagnosis not present

## 2016-10-14 DIAGNOSIS — M1711 Unilateral primary osteoarthritis, right knee: Secondary | ICD-10-CM | POA: Diagnosis not present

## 2016-10-15 DIAGNOSIS — Z96651 Presence of right artificial knee joint: Secondary | ICD-10-CM | POA: Diagnosis not present

## 2016-10-15 DIAGNOSIS — M25661 Stiffness of right knee, not elsewhere classified: Secondary | ICD-10-CM | POA: Diagnosis not present

## 2016-10-15 DIAGNOSIS — M25561 Pain in right knee: Secondary | ICD-10-CM | POA: Diagnosis not present

## 2016-10-15 DIAGNOSIS — M1711 Unilateral primary osteoarthritis, right knee: Secondary | ICD-10-CM | POA: Diagnosis not present

## 2016-10-20 DIAGNOSIS — M1711 Unilateral primary osteoarthritis, right knee: Secondary | ICD-10-CM | POA: Diagnosis not present

## 2016-10-20 DIAGNOSIS — M25661 Stiffness of right knee, not elsewhere classified: Secondary | ICD-10-CM | POA: Diagnosis not present

## 2016-10-20 DIAGNOSIS — M25561 Pain in right knee: Secondary | ICD-10-CM | POA: Diagnosis not present

## 2016-10-20 DIAGNOSIS — Z96651 Presence of right artificial knee joint: Secondary | ICD-10-CM | POA: Diagnosis not present

## 2016-10-22 DIAGNOSIS — Z96651 Presence of right artificial knee joint: Secondary | ICD-10-CM | POA: Diagnosis not present

## 2016-10-22 DIAGNOSIS — M1711 Unilateral primary osteoarthritis, right knee: Secondary | ICD-10-CM | POA: Diagnosis not present

## 2016-10-22 DIAGNOSIS — M25661 Stiffness of right knee, not elsewhere classified: Secondary | ICD-10-CM | POA: Diagnosis not present

## 2016-10-22 DIAGNOSIS — M25561 Pain in right knee: Secondary | ICD-10-CM | POA: Diagnosis not present

## 2016-10-25 ENCOUNTER — Emergency Department (HOSPITAL_COMMUNITY): Payer: Medicare Other

## 2016-10-25 ENCOUNTER — Emergency Department (HOSPITAL_COMMUNITY)
Admission: EM | Admit: 2016-10-25 | Discharge: 2016-10-25 | Disposition: A | Discharge status: Home / Self Care | Payer: Medicare Other | Attending: Emergency Medicine | Admitting: Emergency Medicine

## 2016-10-25 ENCOUNTER — Encounter (HOSPITAL_COMMUNITY): Payer: Self-pay | Admitting: Emergency Medicine

## 2016-10-25 DIAGNOSIS — R030 Elevated blood-pressure reading, without diagnosis of hypertension: Secondary | ICD-10-CM | POA: Diagnosis not present

## 2016-10-25 DIAGNOSIS — Z7984 Long term (current) use of oral hypoglycemic drugs: Secondary | ICD-10-CM | POA: Insufficient documentation

## 2016-10-25 DIAGNOSIS — M25562 Pain in left knee: Secondary | ICD-10-CM | POA: Diagnosis not present

## 2016-10-25 DIAGNOSIS — N183 Chronic kidney disease, stage 3 (moderate): Secondary | ICD-10-CM | POA: Diagnosis not present

## 2016-10-25 DIAGNOSIS — T1490XA Injury, unspecified, initial encounter: Secondary | ICD-10-CM | POA: Diagnosis not present

## 2016-10-25 DIAGNOSIS — Z951 Presence of aortocoronary bypass graft: Secondary | ICD-10-CM | POA: Insufficient documentation

## 2016-10-25 DIAGNOSIS — Z7901 Long term (current) use of anticoagulants: Secondary | ICD-10-CM | POA: Insufficient documentation

## 2016-10-25 DIAGNOSIS — M25462 Effusion, left knee: Secondary | ICD-10-CM | POA: Diagnosis not present

## 2016-10-25 DIAGNOSIS — Z96653 Presence of artificial knee joint, bilateral: Secondary | ICD-10-CM | POA: Diagnosis not present

## 2016-10-25 DIAGNOSIS — Z87891 Personal history of nicotine dependence: Secondary | ICD-10-CM | POA: Diagnosis not present

## 2016-10-25 DIAGNOSIS — Z8673 Personal history of transient ischemic attack (TIA), and cerebral infarction without residual deficits: Secondary | ICD-10-CM | POA: Insufficient documentation

## 2016-10-25 DIAGNOSIS — E1122 Type 2 diabetes mellitus with diabetic chronic kidney disease: Secondary | ICD-10-CM | POA: Insufficient documentation

## 2016-10-25 DIAGNOSIS — I129 Hypertensive chronic kidney disease with stage 1 through stage 4 chronic kidney disease, or unspecified chronic kidney disease: Secondary | ICD-10-CM | POA: Diagnosis not present

## 2016-10-25 DIAGNOSIS — I2510 Atherosclerotic heart disease of native coronary artery without angina pectoris: Secondary | ICD-10-CM | POA: Diagnosis not present

## 2016-10-25 NOTE — ED Notes (Signed)
Ortho paged. 

## 2016-10-25 NOTE — ED Triage Notes (Signed)
Report from [facility]> Pt got up from sitting in chair and started walking approx 1 hour ago and L knee gave out.  Reports L knee replacement in October.  R knee replacement 4 weeks ago.  C/o ? Deformity and swelling to L knee that is worse when bending.

## 2016-10-25 NOTE — Progress Notes (Signed)
Orthopedic Tech Progress Note Patient Details:  Brad Day [birth year] [id number]  Ortho Devices Type of Ortho Device: Knee Immobilizer, Crutches Ortho Device/Splint Interventions: Application   [name] 10/25/2016, 7:42 PM

## 2016-10-25 NOTE — Discharge Instructions (Signed)
Use Moist heat therapy by taking a dish rag and soaking in water then ringing out excess water then microwave for 10-15 seconds until hot but not so hot that it would burn and heat to areas of soreness for 15 minutes, multiple times a day.  Wear knee immobilizer for at least 2 weeks for stabilization of knee. Use crutches as needed for comfort. Ice and elevate knee throughout the day. Alternate between tylenol and pain medication for pain relief. Do not drive or operate machinery with pain medication use. Call orthopedic follow up today or tomorrow to schedule followup appointment for recheck of ongoing knee pain in one to two weeks that can be canceled with a 24-48 hour notice if complete resolution of pain.

## 2016-10-25 NOTE — ED Notes (Signed)
Pt verbalized understanding discharge instructions and denies any further needs or questions at this time. VS stable, ambulatory and steady gait.   

## 2016-10-25 NOTE — ED Provider Notes (Signed)
[facility] DEPT Provider Note   CSN: [id number] Arrival date & time: 10/25/16  1721     History   Chief Complaint Chief Complaint  Patient presents with  . Knee Pain    HPI Brad Day is a 76 y.o. male  Who presents today with chief complaint acute onset intermittent left knee pain and swelling which began earlier today and does not radiate. He states he got up from a seated position and his left knee gave out and he fell forward but caught himself on the kitchen table. He has a history of left knee replacement in October, and a right knee replacement 4 weeks ago, both of which have been healing well up until now. He is on Xarelto due to right knee replacement.  He has not been able to bear weight on his left lower extremity since the accident. He denies hitting his head or loss of consciousness. Denies neck or back pain. He denies numbness or tingling. He states pain is nonexistent at rest, but aggravated by movement, bending, and weight-bearing, and is 10/10 sharp pain in those moments. Pain is generalized to the entire knee, but states particularly painful in the posterior knee. He denies headache, chest pain, shortness of breath, abdominal pain, nausea, vomiting,  Or any other complaints at this time.  The history is provided by the patient and the spouse.    Past Medical History:  Diagnosis Date  . Arthritis   . Carotid artery occlusion   . Coronary artery disease   . Diabetes mellitus without complication ([place])   . Fall down embankment Oct. 26, 2016   Hurt Right shoulder  . GERD (gastroesophageal reflux disease)   . History of bleeding ulcers   . Hypertension   . Shortness of breath dyspnea    with exertion  . Stroke [facility])    4 mini strokes    Patient Active Problem List   Diagnosis Date Noted  . Primary osteoarthritis of right knee 09/07/2016  . Headache 03/22/2016  . Meningitis 03/22/2016  . Diabetes mellitus type 2, controlled ([place]) 03/22/2016  . HTN  (hypertension) 03/22/2016  . HLD (hyperlipidemia) 03/22/2016  . CKD (chronic kidney disease) stage 3, GFR 30-59 ml/min 03/22/2016  . History of left knee surgery 03/22/2016  . Cephalalgia   . Controlled diabetes mellitus type 2 with complications ([place])   . Neck pain   . Primary osteoarthritis of left knee 02/04/2016  . History of CVA (cerebrovascular accident) without residual deficits 02/04/2016  . History of coronary artery bypass, five 02/04/2016  . Bilateral carotid artery disease ([place]) 04/26/2014  . Aftercare following surgery of the circulatory system, [place] 04/15/2012  . Occlusion and stenosis of carotid artery 04/15/2012    Past Surgical History:  Procedure Laterality Date  . CAROTID ENDARTERECTOMY  2007   Left CEA  . CORONARY ARTERY BYPASS GRAFT  2001   Dr. Cyndia Bent  . EYE SURGERY Right Sept.  1, 2014   Cataract  . EYE SURGERY Left Sept.   27, 2014   Cataract  . JOINT REPLACEMENT    . TOTAL KNEE ARTHROPLASTY Left 02/25/2016   Procedure: TOTAL KNEE ARTHROPLASTY;  Surgeon: Renette Butters, MD;  Location: [place];  Service: Orthopedics;  Laterality: Left;  . TOTAL KNEE ARTHROPLASTY Right 09/29/2016   Procedure: TOTAL KNEE ARTHROPLASTY;  Surgeon: Renette Butters, MD;  Location: [place];  Service: Orthopedics;  Laterality: Right;       Home Medications    Prior to Admission  medications   Medication Sig Start Date End Date Taking? Authorizing Provider  atorvastatin (LIPITOR) 40 MG tablet Take 40 mg by mouth daily.  03/30/15   Historical Provider, MD  baclofen (LIORESAL) 10 MG tablet Take 1 tablet (10 mg total) by mouth 3 (three) times daily as needed for muscle spasms. 09/29/16   Charna Elizabeth Martensen III, PA-C  docusate sodium (COLACE) 100 MG capsule Take 1 capsule (100 mg total) by mouth 2 (two) times daily. To prevent constipation while taking pain medication. 09/29/16   Charna Elizabeth Martensen III, PA-C  lisinopril (PRINIVIL,ZESTRIL) 10 MG tablet Take 10 mg by mouth daily at 8  pm.     Historical Provider, MD  metFORMIN (GLUCOPHAGE) 500 MG tablet Take 1 tablet (500 mg total) by mouth 2 (two) times daily with a meal. 03/24/16   Thurnell Lose, MD  metoprolol (LOPRESSOR) 50 MG tablet Take 50 mg by mouth 2 (two) times daily.    Historical Provider, MD  Omega-3 Fatty Acids (FISH OIL) 1200 MG CAPS Take 1,200 mg by mouth 2 (two) times daily.    Historical Provider, MD  ondansetron (ZOFRAN) 4 MG tablet Take 1 tablet (4 mg total) by mouth every 8 (eight) hours as needed for nausea or vomiting. 09/29/16   Prudencio Burly III, PA-C  oxyCODONE-acetaminophen (ROXICET) 5-325 MG tablet Take 1-2 tablets by mouth every 4 (four) hours as needed for severe pain. 09/29/16   Charna Elizabeth Martensen III, PA-C  pantoprazole (PROTONIX) 40 MG tablet Take 40 mg by mouth daily.    Historical Provider, MD  pioglitazone (ACTOS) 15 MG tablet Take 30 mg by mouth daily at 8 pm.  04/02/15   Historical Provider, MD  rivaroxaban (XARELTO) 10 MG TABS tablet Take 1 tablet (10 mg total) by mouth daily. For 30 days for DVT prophylaxis 09/29/16   Prudencio Burly III, PA-C    Family History Family History  Problem Relation Age of Onset  . Stroke Mother   . Heart disease Father     Heart Disease before age 38  . Heart attack Father     X's 41  . Emphysema Sister   . Other Brother     train accident   . Cancer Brother   . Alzheimer's disease Brother     Social History Social History  Substance Use Topics  . Smoking status: Former Smoker    Quit date: 07/06/1976  . Smokeless tobacco: Former Systems developer    Types: Chew  . Alcohol use No     Allergies   Asa [aspirin]   Review of Systems Review of Systems  Constitutional: Negative for chills and fever.  Respiratory: Negative for shortness of breath.   Cardiovascular: Negative for chest pain.  Gastrointestinal: Negative for abdominal pain, nausea and vomiting.  Musculoskeletal: Positive for arthralgias. Negative for joint swelling and neck  pain.       Left knee  Neurological: Negative for dizziness, syncope and headaches.     Physical Exam Updated Vital Signs BP 113/66 (BP Location: Right Arm)   Pulse 68   Temp 98.1 F (36.7 C) (Oral)   Resp 18   SpO2 97%   Physical Exam  Constitutional: He is oriented to person, place, and time. He appears well-developed and well-nourished.  HENT:  Head: Normocephalic and atraumatic.  Right Ear: External ear normal.  Left Ear: External ear normal.  No ttp of skull  Eyes: Conjunctivae and EOM are normal. Right eye exhibits no discharge. Left eye exhibits  no discharge. No scleral icterus.  Neck: Neck supple. No JVD present. No tracheal deviation present.  No midline CSP ttp  Cardiovascular: Normal rate.   2+ dp/pt pulses bl with good cap refill, negative Homan's b/l  Pulmonary/Chest: Effort normal.  Abdominal: He exhibits no distension.  Musculoskeletal: He exhibits edema and tenderness.  Well-healed surgical incision along bilateral knees. Full range of motion of bilateral ankles. 5/5 strength with hip flexion and extension, plantarflexion and dorsiflexion of foot. Pain elicited with any ROM of left knee. Maximally tender laterally and posteriorly. Negative anterior drawer/posterior drawer. There is moderate swelling to the left knee laterally and posteriorly.   Neurological: He is alert and oriented to person, place, and time. No cranial nerve deficit or sensory deficit.  Fluent speech, no facial droop, sensation intact globally  Skin: Skin is warm and dry. Capillary refill takes less than 2 seconds.  No ecchymosis overlying left knee  Psychiatric: He has a normal mood and affect. His behavior is normal.     ED Treatments / Results  Labs (all labs ordered are listed, but only abnormal results are displayed) Labs Reviewed - No data to display  EKG  EKG Interpretation Day       Radiology Dg Knee Complete 4 Views Left  Result Date: 10/25/2016 CLINICAL DATA:  Left  knee weakness and instability while walking. Status post left knee replacement in August 2007. EXAM: LEFT KNEE - COMPLETE 4+ VIEW COMPARISON:  02/25/2016. FINDINGS: Stable appearance of a left total knee prosthesis without fracture or dislocation. Moderate to large-sized effusion. No evidence of prosthetic loosening. IMPRESSION: Stable left knee prosthesis with an interval moderate to large sized effusion. Electronically Signed   By: Claudie Revering M.D.   On: 10/25/2016 18:44    Procedures Procedures (including critical care time)  Medications Ordered in ED Medications - No data to display   Initial Impression / Assessment and Plan / ED Course  I have reviewed the triage vital signs and the nursing notes.  Pertinent labs & imaging results that were available during my care of the patient were reviewed by me and considered in my medical decision making (see chart for details).     Pt on xarelto s/p right knee replacement 4 weeks ago presents with acute onset left knee pain and swelling after knee "gave out" earlier this evening. Pt afebrile, VSS while in ED. Limited ROM due to pain, neurovascularly intact with good capillary refill, soft compartments, and no calf tenderness. Low suspicion DVT, hemarthrosis, or compartment syndrome. Xrays negative for fracture or dislocation, stable prosthesis, and moderate effusion seen. Discussed supportive care with tylenol, elevation, ice, heat, and brace. Knee immobilizer applied in ED, crutches given, and recommend f/u with orthopedics within 1 week for re-evaluation. Discussed strict ED return precautions. Pt verbalized understanding of and agreement with plan and is safe for discharge home at this time.   Final Clinical Impressions(s) / ED Diagnoses   Final diagnoses:  Acute pain of left knee    New Prescriptions Discharge Medication List as of 10/25/2016  7:04 PM       Renita Papa, PA-C 10/26/16 1447    Leo Grosser, MD 10/27/16 [phone]

## 2016-10-30 DIAGNOSIS — M1712 Unilateral primary osteoarthritis, left knee: Secondary | ICD-10-CM | POA: Diagnosis not present

## 2016-11-02 DIAGNOSIS — M25661 Stiffness of right knee, not elsewhere classified: Secondary | ICD-10-CM | POA: Diagnosis not present

## 2016-11-02 DIAGNOSIS — M25561 Pain in right knee: Secondary | ICD-10-CM | POA: Diagnosis not present

## 2016-11-02 DIAGNOSIS — M1711 Unilateral primary osteoarthritis, right knee: Secondary | ICD-10-CM | POA: Diagnosis not present

## 2016-11-02 DIAGNOSIS — Z96651 Presence of right artificial knee joint: Secondary | ICD-10-CM | POA: Diagnosis not present

## 2016-11-05 DIAGNOSIS — Z96651 Presence of right artificial knee joint: Secondary | ICD-10-CM | POA: Diagnosis not present

## 2016-11-05 DIAGNOSIS — M1711 Unilateral primary osteoarthritis, right knee: Secondary | ICD-10-CM | POA: Diagnosis not present

## 2016-11-05 DIAGNOSIS — M25561 Pain in right knee: Secondary | ICD-10-CM | POA: Diagnosis not present

## 2016-11-05 DIAGNOSIS — M25661 Stiffness of right knee, not elsewhere classified: Secondary | ICD-10-CM | POA: Diagnosis not present

## 2016-11-11 DIAGNOSIS — M1711 Unilateral primary osteoarthritis, right knee: Secondary | ICD-10-CM | POA: Diagnosis not present

## 2016-11-16 DIAGNOSIS — M25661 Stiffness of right knee, not elsewhere classified: Secondary | ICD-10-CM | POA: Diagnosis not present

## 2016-11-16 DIAGNOSIS — M1711 Unilateral primary osteoarthritis, right knee: Secondary | ICD-10-CM | POA: Diagnosis not present

## 2016-11-16 DIAGNOSIS — M25561 Pain in right knee: Secondary | ICD-10-CM | POA: Diagnosis not present

## 2016-11-16 DIAGNOSIS — Z96651 Presence of right artificial knee joint: Secondary | ICD-10-CM | POA: Diagnosis not present

## 2016-11-19 DIAGNOSIS — Z96651 Presence of right artificial knee joint: Secondary | ICD-10-CM | POA: Diagnosis not present

## 2016-11-19 DIAGNOSIS — M25561 Pain in right knee: Secondary | ICD-10-CM | POA: Diagnosis not present

## 2016-11-19 DIAGNOSIS — M1711 Unilateral primary osteoarthritis, right knee: Secondary | ICD-10-CM | POA: Diagnosis not present

## 2016-11-19 DIAGNOSIS — M25661 Stiffness of right knee, not elsewhere classified: Secondary | ICD-10-CM | POA: Diagnosis not present

## 2016-12-04 ENCOUNTER — Encounter (HOSPITAL_COMMUNITY): Payer: Self-pay | Admitting: Orthopedic Surgery

## 2016-12-04 NOTE — Addendum Note (Signed)
Addendum  created 12/04/16 1126 by Effie Berkshire, MD   Sign clinical note

## 2016-12-11 DIAGNOSIS — E1122 Type 2 diabetes mellitus with diabetic chronic kidney disease: Secondary | ICD-10-CM | POA: Diagnosis not present

## 2016-12-11 DIAGNOSIS — K279 Peptic ulcer, site unspecified, unspecified as acute or chronic, without hemorrhage or perforation: Secondary | ICD-10-CM | POA: Diagnosis not present

## 2016-12-11 DIAGNOSIS — I10 Essential (primary) hypertension: Secondary | ICD-10-CM | POA: Diagnosis not present

## 2016-12-11 DIAGNOSIS — E782 Mixed hyperlipidemia: Secondary | ICD-10-CM | POA: Diagnosis not present

## 2016-12-11 DIAGNOSIS — Z0000 Encounter for general adult medical examination without abnormal findings: Secondary | ICD-10-CM | POA: Diagnosis not present

## 2016-12-11 DIAGNOSIS — Z6838 Body mass index (BMI) 38.0-38.9, adult: Secondary | ICD-10-CM | POA: Diagnosis not present

## 2016-12-11 DIAGNOSIS — Z7984 Long term (current) use of oral hypoglycemic drugs: Secondary | ICD-10-CM | POA: Diagnosis not present

## 2017-04-15 DIAGNOSIS — Z23 Encounter for immunization: Secondary | ICD-10-CM | POA: Diagnosis not present

## 2017-05-02 IMAGING — MR MR CERVICAL SPINE WO/W CM
4 of 9 series · 24 of 48 positions shown · IV contrast (multihance)
Comparison: CTA neck 03/22/2016

CLINICAL DATA: Neck pain

EXAM:
MRI CERVICAL SPINE WITHOUT AND WITH CONTRAST
TECHNIQUE: Multiplanar and multiecho pulse sequences of the cervical spine, to
include the craniocervical junction and cervicothoracic junction,
were obtained according to standard protocol without and with
intravenous contrast.
CONTRAST:  10mL MULTIHANCE GADOBENATE DIMEGLUMINE 529 MG/ML IV SOLN

[Series 2: T2 · sagittal · 3.0mm · 0.43mm/px · 3 of 15 slices shown (1 of 2)]
[im 1/15]
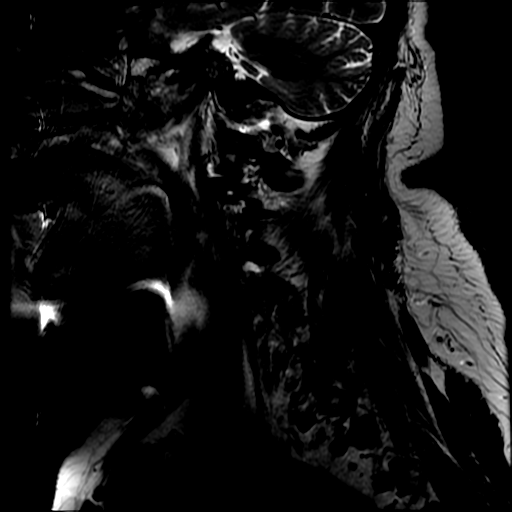
[im 8/15]
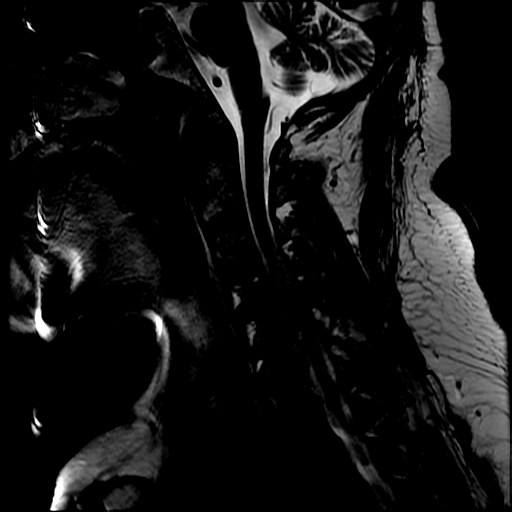
[im 15/15]
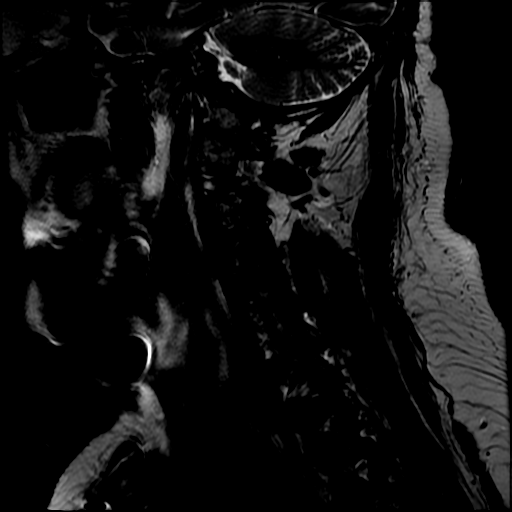

[Series 8: T2 · axial · 3.0mm · 0.39mm/px · z∈[-126,-14]mm · 7 of 36 slices shown (2 of 2)]
[im 1/36]
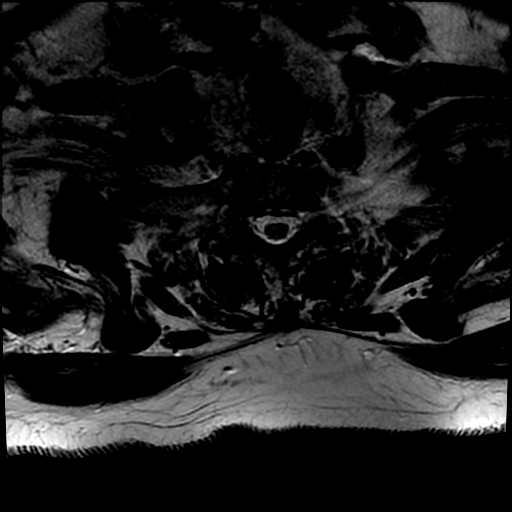
[im 6/36]
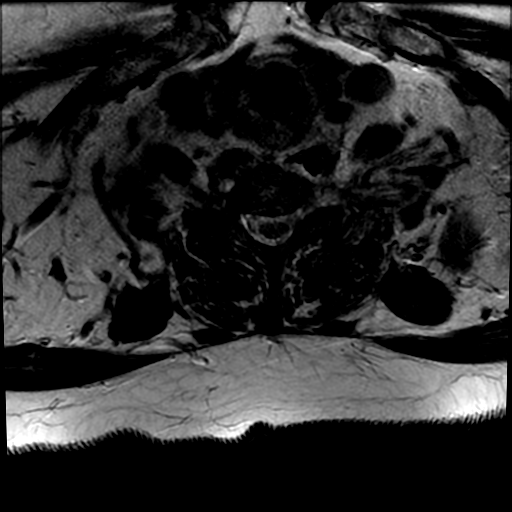
[im 12/36]
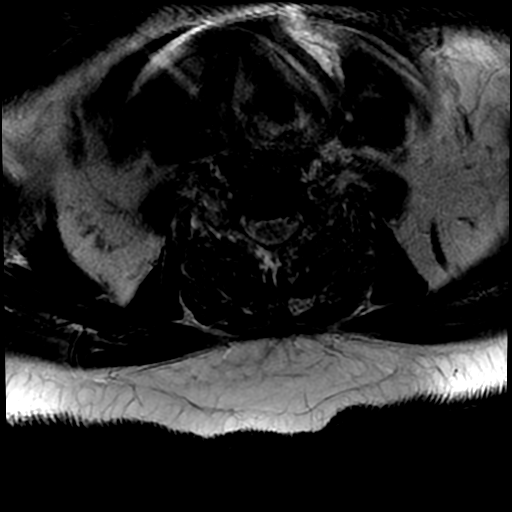
[im 18/36]
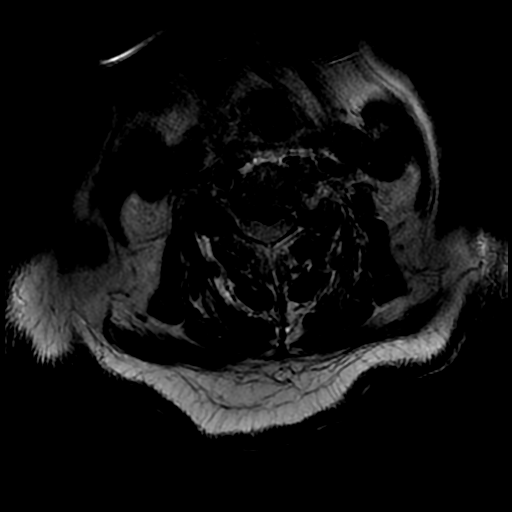
[im 24/36]
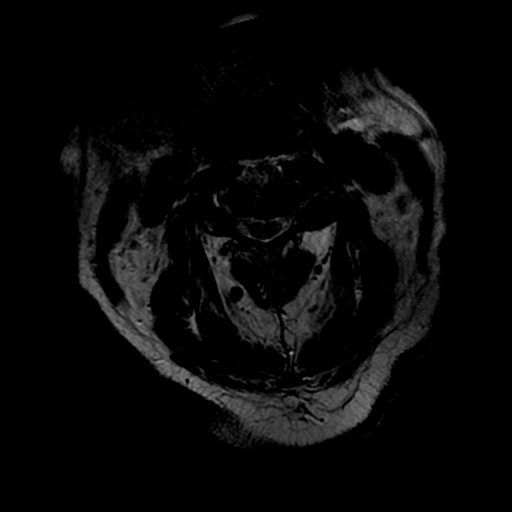
[im 30/36]
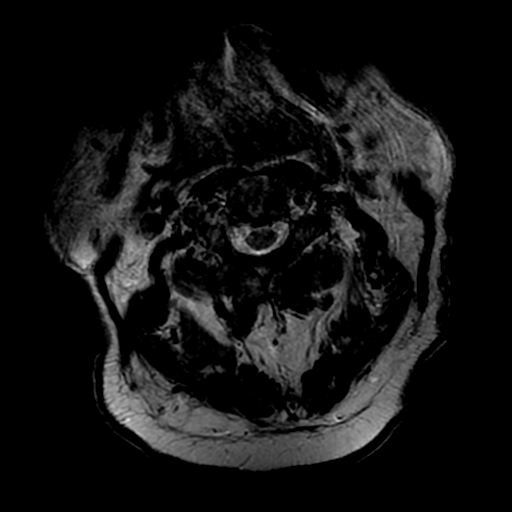
[im 36/36]
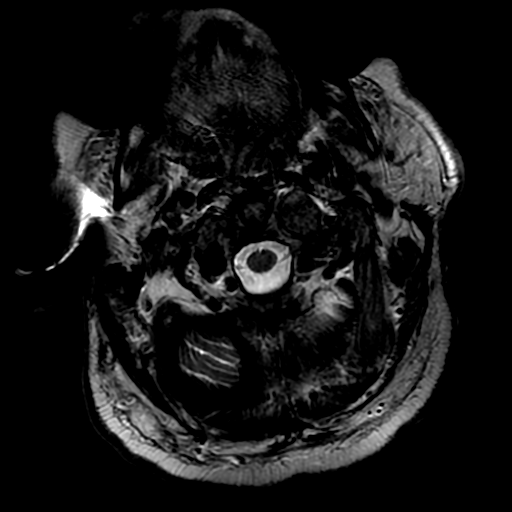

[Series 9: T1 · axial · non-contrast · 3.0mm · 0.39mm/px · z∈[-126,-14]mm · 7 of 36 slices shown (1 of 2)]
[im 1/36]
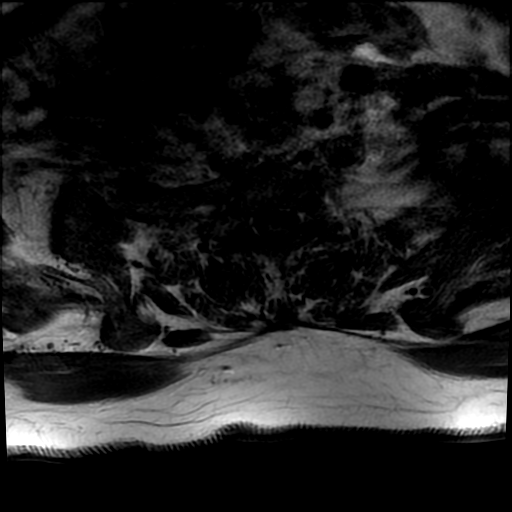
[im 6/36]
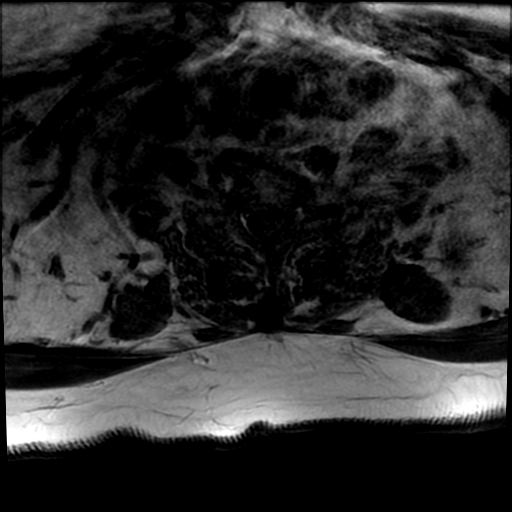
[im 12/36]
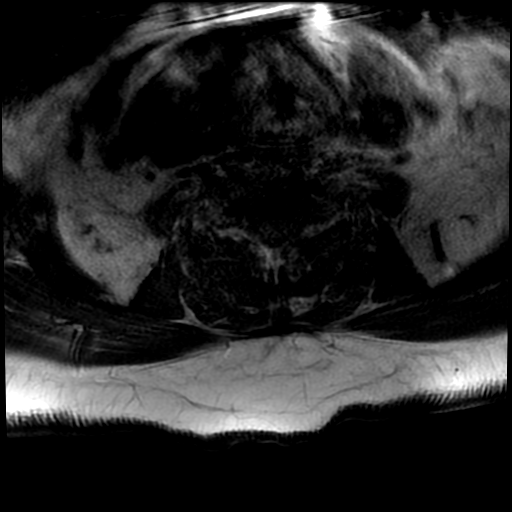
[im 18/36]
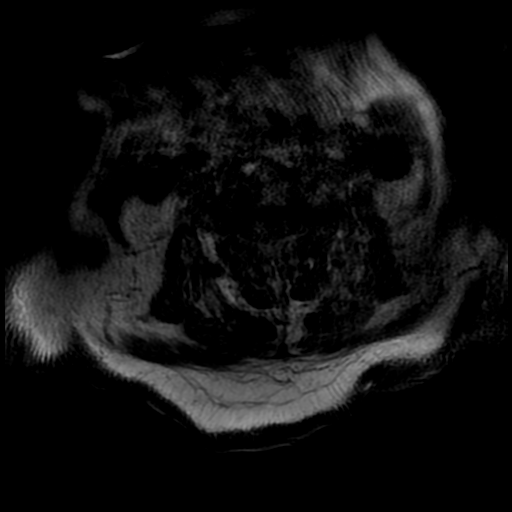
[im 24/36]
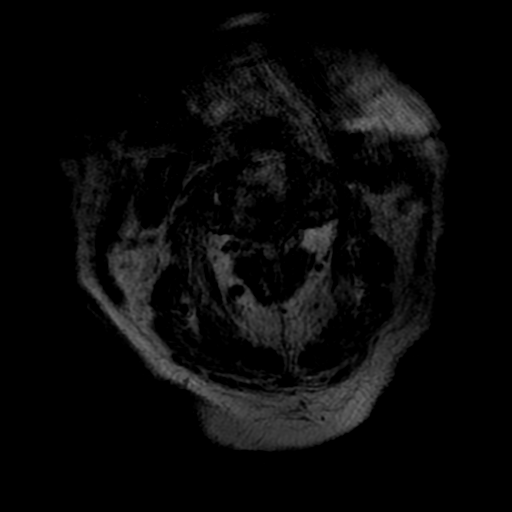
[im 30/36]
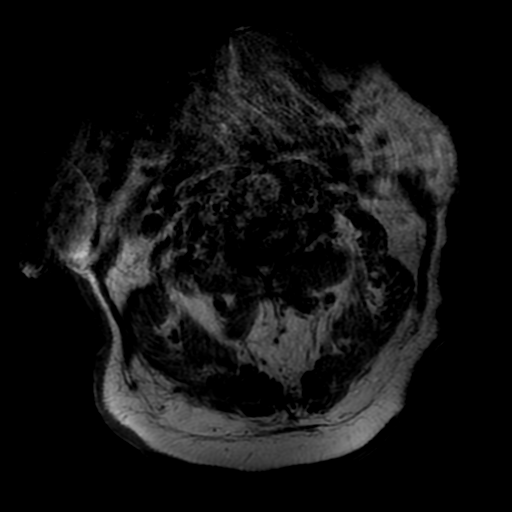
[im 36/36]
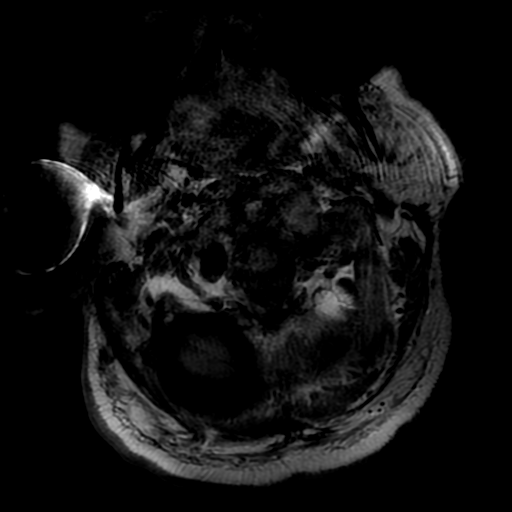

[Series 12: T1 · axial · 3.0mm · 0.78mm/px · z∈[-126,-14]mm · 7 of 36 slices shown (2 of 2)]
[im 1/36]
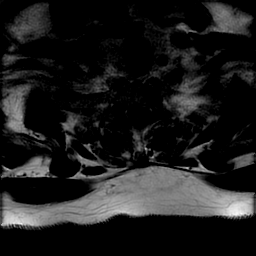
[im 6/36]
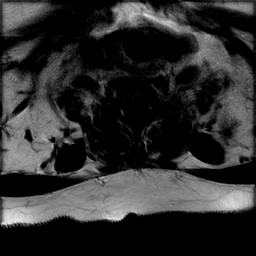
[im 12/36]
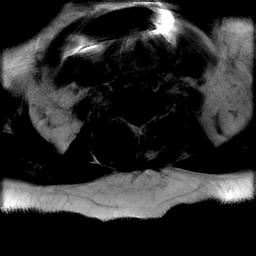
[im 18/36]
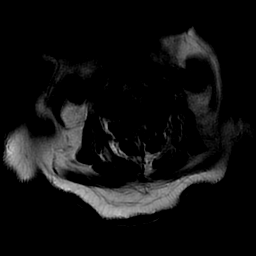
[im 24/36]
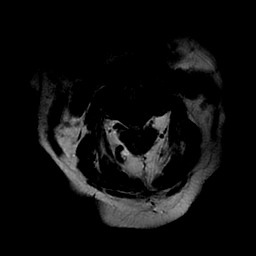
[im 30/36]
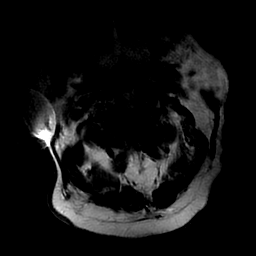
[im 36/36]
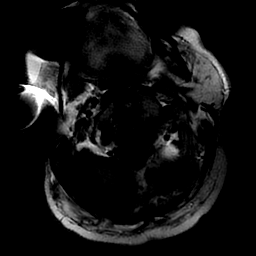

[24 of 48 positions shown; findings below may reference images not displayed]

FINDINGS: Image quality degraded by mild motion. There is also artifact from
dental hardware which degrades image quality most notably on the
postcontrast fat suppressed images.

Normal alignment with straightening of the cervical lordosis.

Negative for fracture or mass

Spinal cord signal normal.  No cord lesion.

Postcontrast imaging limited by artifact however no abnormal
enhancement identified.

Brainstem and posterior fossa within normal limits.

C2-3:  Negative

C3-4:  Mild disc degeneration and disc bulging.

C4-5: Disc bulging and spurring. Central disc protrusion flattening
the cord with mild to moderate spinal stenosis.

C5-6: Disc degeneration and spondylosis right greater than left.
Moderate right foraminal encroachment due to spurring. Mild spinal
stenosis

C6-7: Mild disc degeneration and spurring. Mild foraminal narrowing
bilaterally

C7-T1:  Negative
IMPRESSION: Multifocal cervical spondylosis.  Negative for fracture or mass

Central disc protrusion with mild-to-moderate spinal stenosis C4-5

Moderate right foraminal encroachment due to spurring and disc
material. Mild spinal stenosis at C5-6.

## 2017-05-06 ENCOUNTER — Ambulatory Visit: Payer: Medicare Other | Admitting: Family

## 2017-05-06 ENCOUNTER — Encounter (HOSPITAL_COMMUNITY): Payer: Medicare Other

## 2017-05-10 ENCOUNTER — Ambulatory Visit (INDEPENDENT_AMBULATORY_CARE_PROVIDER_SITE_OTHER): Payer: Medicare Other | Admitting: Family

## 2017-05-10 ENCOUNTER — Ambulatory Visit (HOSPITAL_COMMUNITY)
Admission: RE | Admit: 2017-05-10 | Discharge: 2017-05-10 | Disposition: A | Discharge status: Home / Self Care | Payer: Medicare Other | Source: Ambulatory Visit | Attending: Family | Admitting: Family

## 2017-05-10 ENCOUNTER — Encounter: Payer: Self-pay | Admitting: Family

## 2017-05-10 VITALS — BP 135/78 | HR 53 | Temp 97.3°F | Resp 18 | Ht 70.0 in | Wt 270.0 lb

## 2017-05-10 DIAGNOSIS — Z9889 Other specified postprocedural states: Secondary | ICD-10-CM | POA: Diagnosis not present

## 2017-05-10 DIAGNOSIS — I6529 Occlusion and stenosis of unspecified carotid artery: Secondary | ICD-10-CM

## 2017-05-10 DIAGNOSIS — I6523 Occlusion and stenosis of bilateral carotid arteries: Secondary | ICD-10-CM

## 2017-05-10 LAB — VAS US CAROTID
LCCAPSYS: 66 cm/s
LEFT ECA DIAS: -24 cm/s
LEFT VERTEBRAL DIAS: 15 cm/s
LICADDIAS: -34 cm/s
LICADSYS: -93 cm/s
LICAPDIAS: -27 cm/s
LICAPSYS: -74 cm/s
Left CCA dist dias: 22 cm/s
Left CCA dist sys: 61 cm/s
Left CCA prox dias: 22 cm/s
RIGHT CCA MID DIAS: 19 cm/s
RIGHT ECA DIAS: -20 cm/s
Right CCA prox dias: 19 cm/s
Right CCA prox sys: 84 cm/s
Right cca dist sys: -105 cm/s

## 2017-05-10 NOTE — Patient Instructions (Signed)
Stroke Prevention Some medical conditions and behaviors are associated with an increased chance of having a stroke. You may prevent a stroke by making healthy choices and managing medical conditions. How can I reduce my risk of having a stroke?  Stay physically active. Get at least 30 minutes of activity on most or all days.  Do not smoke. It may also be helpful to avoid exposure to secondhand smoke.  Limit alcohol use. Moderate alcohol use is considered to be: ? No more than 2 drinks per day for men. ? No more than 1 drink per day for nonpregnant women.  Eat healthy foods. This involves: ? Eating 5 or more servings of fruits and vegetables a day. ? Making dietary changes that address high blood pressure (hypertension), high cholesterol, diabetes, or obesity.  Manage your cholesterol levels. ? Making food choices that are high in fiber and low in saturated fat, trans fat, and cholesterol may control cholesterol levels. ? Take any prescribed medicines to control cholesterol as directed by your health care provider.  Manage your diabetes. ? Controlling your carbohydrate and sugar intake is recommended to manage diabetes. ? Take any prescribed medicines to control diabetes as directed by your health care provider.  Control your hypertension. ? Making food choices that are low in salt (sodium), saturated fat, trans fat, and cholesterol is recommended to manage hypertension. ? Ask your health care provider if you need treatment to lower your blood pressure. Take any prescribed medicines to control hypertension as directed by your health care provider. ? If you are 18-39 years of age, have your blood pressure checked every 3-5 years. If you are 40 years of age or older, have your blood pressure checked every year.  Maintain a healthy weight. ? Reducing calorie intake and making food choices that are low in sodium, saturated fat, trans fat, and cholesterol are recommended to manage  weight.  Stop drug abuse.  Avoid taking birth control pills. ? Talk to your health care provider about the risks of taking birth control pills if you are over 35 years old, smoke, get migraines, or have ever had a blood clot.  Get evaluated for sleep disorders (sleep apnea). ? Talk to your health care provider about getting a sleep evaluation if you snore a lot or have excessive sleepiness.  Take medicines only as directed by your health care provider. ? For some people, aspirin or blood thinners (anticoagulants) are helpful in reducing the risk of forming abnormal blood clots that can lead to stroke. If you have the irregular heart rhythm of atrial fibrillation, you should be on a blood thinner unless there is a good reason you cannot take them. ? Understand all your medicine instructions.  Make sure that other conditions (such as anemia or atherosclerosis) are addressed. Get help right away if:  You have sudden weakness or numbness of the face, arm, or leg, especially on one side of the body.  Your face or eyelid droops to one side.  You have sudden confusion.  You have trouble speaking (aphasia) or understanding.  You have sudden trouble seeing in one or both eyes.  You have sudden trouble walking.  You have dizziness.  You have a loss of balance or coordination.  You have a sudden, severe headache with no known cause.  You have new chest pain or an irregular heartbeat. Any of these symptoms may represent a serious problem that is an emergency. Do not wait to see if the symptoms will go away.   Get medical help at once. Call your local emergency services (911 in [place] Do not drive yourself to the hospital. This information is not intended to replace advice given to you by your health care provider. Make sure you discuss any questions you have with your health care provider. Document Released: 07/30/2004 Document Revised: 11/28/2015 Document Reviewed: 12/23/2012 Elsevier  Interactive Patient Education  2017 [employer].     Stroke Prevention Some health problems and behaviors may make it more likely for you to have a stroke. Below are ways to lessen your risk of having a stroke.  Be active for at least 30 minutes on most or all days.  Do not smoke. Try not to be around others who smoke.  Do not drink too much alcohol. ? Do not have more than 2 drinks a day if you are a man. ? Do not have more than 1 drink a day if you are a woman and are not pregnant.  Eat healthy foods, such as fruits and vegetables. If you were put on a specific diet, follow the diet as told.  Keep your cholesterol levels under control through diet and medicines. Look for foods that are low in saturated fat, trans fat, cholesterol, and are high in fiber.  If you have diabetes, follow all diet plans and take your medicine as told.  Ask your doctor if you need treatment to lower your blood pressure. If you have high blood pressure (hypertension), follow all diet plans and take your medicine as told by your doctor.  If you are 3-19 years old, have your blood pressure checked every 3-5 years. If you are age 82 or older, have your blood pressure checked every year.  Keep a healthy weight. Eat foods that are low in calories, salt, saturated fat, trans fat, and cholesterol.  Do not take drugs.  Avoid birth control pills, if this applies. Talk to your doctor about the risks of taking birth control pills.  Talk to your doctor if you have sleep problems (sleep apnea).  Take all medicine as told by your doctor. ? You may be told to take aspirin or blood thinner medicine. Take this medicine as told by your doctor. ? Understand your medicine instructions.  Make sure any other conditions you have are being taken care of.  Get help right away if:  You suddenly lose feeling (you feel numb) or have weakness in your face, arm, or leg.  Your face or eyelid hangs down to one side.  You  suddenly feel confused.  You have trouble talking (aphasia) or understanding what people are saying.  You suddenly have trouble seeing in one or both eyes.  You suddenly have trouble walking.  You are dizzy.  You lose your balance or your movements are clumsy (uncoordinated).  You suddenly have a very bad headache and you do not know the cause.  You have new chest pain.  Your heart feels like it is fluttering or skipping a beat (irregular heartbeat). Do not wait to see if the symptoms above go away. Get help right away. Call your local emergency services (911 in [place] Do not drive yourself to the hospital. This information is not intended to replace advice given to you by your health care provider. Make sure you discuss any questions you have with your health care provider. Document Released: 12/22/2011 Document Revised: 11/28/2015 Document Reviewed: 12/23/2012 Elsevier Interactive Patient Education  [employer].

## 2017-05-10 NOTE — Progress Notes (Signed)
Chief Complaint: Follow up Extracranial Carotid Artery Stenosis   History of Present Illness  Brad Day is a 76 y.o. male who is status post left CEA in 2007 by Dr. Oneida Alar and returns today for follow up.  He had 4 preoperative TIA's as manifested by expressive aphasia, none since the CEA.  Patient denies cardiac problems.   The patient denies any history of amaurosis fugax or monocular blindness, unilateral facial drooping, or hemiplegia. He denies claudication symptoms with walking, denies non healing wounds.  He denies any steal symptoms in either arm/hand, denies dizziness.  He has had ESI's for spine issues which have helped his pain.   Pt Diabetic: Yes, 7.7 A1C on 09-18-16 (review of records) Pt smoker: former smoker, quit 1978  Pt meds include:  Statin : Yes  Betablocker: Yes  ASA: No: due to history of GI ulcers with bleeding  Other anticoagulants/antiplatelets: Xarelto started after the left knee replacement, was stopped a month after his last knee replacement   Past Medical History:  Diagnosis Date  . Arthritis   . Carotid artery occlusion   . Coronary artery disease   . Diabetes mellitus without complication ([place])   . Fall down embankment Oct. 26, 2016   Hurt Right shoulder  . GERD (gastroesophageal reflux disease)   . History of bleeding ulcers   . Hypertension   . Shortness of breath dyspnea    with exertion  . Stroke [facility])    4 mini strokes    Social History Social History   Tobacco Use  . Smoking status: Former Smoker    Types: Cigarettes    Last attempt to quit: 07/06/1976    Years since quitting: 40.8  . Smokeless tobacco: Former Systems developer    Types: Chew  Substance Use Topics  . Alcohol use: No  . Drug use: No    Family History Family History  Problem Relation Age of Onset  . Stroke Mother   . Heart disease Father        Heart Disease before age 80  . Heart attack Father        X's 9  . Emphysema Sister   . Other Brother         train accident   . Cancer Brother   . Alzheimer's disease Brother     Surgical History Past Surgical History:  Procedure Laterality Date  . CAROTID ENDARTERECTOMY  2007   Left CEA  . CORONARY ARTERY BYPASS GRAFT  2001   Dr. Cyndia Bent  . EYE SURGERY Right Sept.  1, 2014   Cataract  . EYE SURGERY Left Sept.   27, 2014   Cataract  . JOINT REPLACEMENT      Allergies  Allergen Reactions  . Asa [Aspirin] Other (See Comments)    Causes bleeding Ulcer    Current Outpatient Medications  Medication Sig Dispense Refill  . atorvastatin (LIPITOR) 40 MG tablet Take 40 mg by mouth daily.     . baclofen (LIORESAL) 10 MG tablet Take 1 tablet (10 mg total) by mouth 3 (three) times daily as needed for muscle spasms. 40 each 0  . docusate sodium (COLACE) 100 MG capsule Take 1 capsule (100 mg total) by mouth 2 (two) times daily. To prevent constipation while taking pain medication. 60 capsule 0  . lisinopril (PRINIVIL,ZESTRIL) 10 MG tablet Take 10 mg by mouth daily at 8 pm.     . metFORMIN (GLUCOPHAGE) 500 MG tablet Take 1 tablet (500 mg total) by  mouth 2 (two) times daily with a meal.    . metoprolol (LOPRESSOR) 50 MG tablet Take 50 mg by mouth 2 (two) times daily.    . Omega-3 Fatty Acids (FISH OIL) 1200 MG CAPS Take 1,200 mg by mouth 2 (two) times daily.    . pantoprazole (PROTONIX) 40 MG tablet Take 40 mg by mouth daily.    . pioglitazone (ACTOS) 15 MG tablet Take 30 mg by mouth daily at 8 pm.     . rivaroxaban (XARELTO) 10 MG TABS tablet Take 1 tablet (10 mg total) by mouth daily. For 30 days for DVT prophylaxis 30 tablet 0  . ondansetron (ZOFRAN) 4 MG tablet Take 1 tablet (4 mg total) by mouth every 8 (eight) hours as needed for nausea or vomiting. (Patient not taking: Reported on 05/10/2017) 40 tablet 0  . oxyCODONE-acetaminophen (ROXICET) 5-325 MG tablet Take 1-2 tablets by mouth every 4 (four) hours as needed for severe pain. (Patient not taking: Reported on 05/10/2017) 60 tablet 0    No current facility-administered medications for this visit.     Review of Systems : See HPI for pertinent positives and negatives.  Physical Examination  Vitals:   05/10/17 0842 05/10/17 0848  BP: 137/68 135/78  Pulse: (!) 53 (!) 53  Resp: 18   Temp: (!) 97.3 F (36.3 C)   SpO2: 97%   Weight: 270 lb (122.5 kg)   Height: 5\' 10"  (1.778 m)    Body mass index is 38.74 kg/m.  General: WDWN obese male in NAD  GAIT:normal  Eyes: PERRLA  Pulmonary: Respirations are non labored, CTAB, no rales, no rhonchi, &no wheezing.  Cardiac: regular rhythm, no detected murmur.  VASCULAR EXAM Carotid Bruits Left Right   Negative  Negative    Abdominal aortic pulse is not palpable.  Radial pulses are 2+ palpable and equal.   LE Pulses  LEFT  RIGHT   POPLITEAL  not palpable  not palpable  POSTERIOR TIBIAL  faintly palpable  Faintly palpable   DORSALIS PEDIS ANTERIOR TIBIAL  faintly palpable  Faintly palpable    Gastrointestinal: soft, nontender, BS WNL, no r/g, no palpated masses.  Musculoskeletal: No muscle atrophy/wasting. M/S 5/5 throughout, Extremities without ischemic changes. 1+ pitting and non pitting edema in both ankles. Neurologic: A&O X 3; Appropriate Affect, normal sensation; speech is normal, CN 2-12 intact except, Pain and light touch intact in extremities, Motor exam as listed above.  Assessment: Brad Day is a 76 y.o. male whois status post left CEA in 2007. He had 4 preoperative TIA's, none subsequently.  DATA Carotid Duplex (05/10/17): Right ICA: 1-39% stenosis Left internal carotid artery is patent with history of carotid endarterectomy, no hyperplasia or hemodynamically significant changes present. Essentially unchanged since exams on 05/02/15 and 05/04/16.    Plan: Follow-up in 18 months with Carotid Duplex scan.     I discussed in depth with the patient the nature of atherosclerosis, and emphasized the  importance of maximal medical management including strict control of blood pressure, blood glucose, and lipid levels, obtaining regular exercise, and continued cessation of smoking.  The patient is aware that without maximal medical management the underlying atherosclerotic disease process will progress, limiting the benefit of any interventions. The patient was given information about stroke prevention and what symptoms should prompt the patient to seek immediate medical care. Thank you for allowing [place] to participate in this patient's care.  Clemon Chambers, RN, MSN, FNP-C [facility] [place] Office: [phone]  Clinic Physician: Trula Slade  05/10/17 8:50 AM

## 2017-05-18 NOTE — Addendum Note (Signed)
Addended by: Lianne Cure A on: 05/18/2017 04:48 PM   Modules accepted: Orders

## 2017-06-18 DIAGNOSIS — K279 Peptic ulcer, site unspecified, unspecified as acute or chronic, without hemorrhage or perforation: Secondary | ICD-10-CM | POA: Diagnosis not present

## 2017-06-18 DIAGNOSIS — I10 Essential (primary) hypertension: Secondary | ICD-10-CM | POA: Diagnosis not present

## 2017-06-18 DIAGNOSIS — E782 Mixed hyperlipidemia: Secondary | ICD-10-CM | POA: Diagnosis not present

## 2017-06-18 DIAGNOSIS — E1122 Type 2 diabetes mellitus with diabetic chronic kidney disease: Secondary | ICD-10-CM | POA: Diagnosis not present

## 2017-09-15 DIAGNOSIS — Z961 Presence of intraocular lens: Secondary | ICD-10-CM | POA: Diagnosis not present

## 2017-09-15 DIAGNOSIS — E119 Type 2 diabetes mellitus without complications: Secondary | ICD-10-CM | POA: Diagnosis not present

## 2017-09-15 DIAGNOSIS — H52203 Unspecified astigmatism, bilateral: Secondary | ICD-10-CM | POA: Diagnosis not present

## 2017-12-27 DIAGNOSIS — M1990 Unspecified osteoarthritis, unspecified site: Secondary | ICD-10-CM | POA: Diagnosis not present

## 2017-12-27 DIAGNOSIS — I779 Disorder of arteries and arterioles, unspecified: Secondary | ICD-10-CM | POA: Diagnosis not present

## 2017-12-27 DIAGNOSIS — I10 Essential (primary) hypertension: Secondary | ICD-10-CM | POA: Diagnosis not present

## 2017-12-27 DIAGNOSIS — I2510 Atherosclerotic heart disease of native coronary artery without angina pectoris: Secondary | ICD-10-CM | POA: Diagnosis not present

## 2017-12-27 DIAGNOSIS — J449 Chronic obstructive pulmonary disease, unspecified: Secondary | ICD-10-CM | POA: Diagnosis not present

## 2017-12-27 DIAGNOSIS — Z1211 Encounter for screening for malignant neoplasm of colon: Secondary | ICD-10-CM | POA: Diagnosis not present

## 2017-12-27 DIAGNOSIS — M4800 Spinal stenosis, site unspecified: Secondary | ICD-10-CM | POA: Diagnosis not present

## 2017-12-27 DIAGNOSIS — E1122 Type 2 diabetes mellitus with diabetic chronic kidney disease: Secondary | ICD-10-CM | POA: Diagnosis not present

## 2017-12-27 DIAGNOSIS — E782 Mixed hyperlipidemia: Secondary | ICD-10-CM | POA: Diagnosis not present

## 2017-12-27 DIAGNOSIS — K279 Peptic ulcer, site unspecified, unspecified as acute or chronic, without hemorrhage or perforation: Secondary | ICD-10-CM | POA: Diagnosis not present

## 2017-12-27 DIAGNOSIS — Z0000 Encounter for general adult medical examination without abnormal findings: Secondary | ICD-10-CM | POA: Diagnosis not present

## 2017-12-27 DIAGNOSIS — I714 Abdominal aortic aneurysm, without rupture: Secondary | ICD-10-CM | POA: Diagnosis not present

## 2018-03-22 ENCOUNTER — Other Ambulatory Visit: Payer: Self-pay | Admitting: Family Medicine

## 2018-03-22 DIAGNOSIS — I719 Aortic aneurysm of unspecified site, without rupture: Secondary | ICD-10-CM

## 2018-04-28 DIAGNOSIS — Z23 Encounter for immunization: Secondary | ICD-10-CM | POA: Diagnosis not present

## 2018-05-23 ENCOUNTER — Ambulatory Visit (INDEPENDENT_AMBULATORY_CARE_PROVIDER_SITE_OTHER): Payer: Medicare Other | Admitting: Family

## 2018-05-23 ENCOUNTER — Encounter: Payer: Self-pay | Admitting: Family

## 2018-05-23 ENCOUNTER — Other Ambulatory Visit: Payer: Self-pay

## 2018-05-23 ENCOUNTER — Ambulatory Visit (HOSPITAL_COMMUNITY)
Admission: RE | Admit: 2018-05-23 | Discharge: 2018-05-23 | Disposition: A | Discharge status: Home / Self Care | Payer: Medicare Other | Source: Ambulatory Visit | Attending: Vascular Surgery | Admitting: Vascular Surgery

## 2018-05-23 VITALS — BP 142/76 | HR 59 | Temp 97.4°F | Resp 18 | Ht 70.0 in | Wt 266.0 lb

## 2018-05-23 DIAGNOSIS — Z9889 Other specified postprocedural states: Secondary | ICD-10-CM | POA: Insufficient documentation

## 2018-05-23 DIAGNOSIS — I6523 Occlusion and stenosis of bilateral carotid arteries: Secondary | ICD-10-CM | POA: Insufficient documentation

## 2018-05-23 NOTE — Patient Instructions (Signed)

## 2018-05-23 NOTE — Progress Notes (Signed)
Chief Complaint: Follow up Extracranial Carotid Artery Stenosis   History of Present Illness  Brad Day is a 77 y.o. male who is status post left CEA in 2007 by Dr. Oneida Alar and returns today for follow up.  He had 4 preoperative TIA's as manifested by expressive aphasia, none since the CEA.  Patient denies cardiac problems.   The patient denies any history of amaurosis fugax or monocular blindness, unilateral facial drooping,orhemiplegia. He denies claudication type symptoms with walking, denies non healing wounds.  He denies any steal symptoms in either arm/hand, denies dizziness.  He has had ESI's for spine issues which have helped his pain.   Diabetic: Yes, 7.7 is most recent A1C on file, 09-18-16 (review of records) Tobacco use: former smoker, quit 1978  Pt meds include:  Statin : Yes  Betablocker: Yes  ASA: No: due to history of GI ulcers with bleeding  Other anticoagulants/antiplatelets:Xarelto started after the left knee replacement, was stopped a month after his last knee replacement, Xarelto is still on his list, states he is still taking a blood thinner, but does not know what it is.    Past Medical History:  Diagnosis Date  . Arthritis   . Carotid artery occlusion   . Coronary artery disease   . Diabetes mellitus without complication ([place])   . Fall down embankment Oct. 26, 2016   Hurt Right shoulder  . GERD (gastroesophageal reflux disease)   . History of bleeding ulcers   . Hypertension   . Shortness of breath dyspnea    with exertion  . Stroke [facility])    4 mini strokes    Social History Social History   Tobacco Use  . Smoking status: Former Smoker    Types: Cigarettes    Last attempt to quit: 07/06/1976    Years since quitting: 41.9  . Smokeless tobacco: Former Systems developer    Types: Chew  Substance Use Topics  . Alcohol use: No  . Drug use: No    Family History Family History  Problem Relation Age of Onset  . Stroke Mother   .  Heart disease Father        Heart Disease before age 22  . Heart attack Father        X's 52  . Emphysema Sister   . Other Brother        train accident   . Cancer Brother   . Alzheimer's disease Brother     Surgical History Past Surgical History:  Procedure Laterality Date  . CAROTID ENDARTERECTOMY  2007   Left CEA  . CORONARY ARTERY BYPASS GRAFT  2001   Dr. Cyndia Bent  . EYE SURGERY Right Sept.  1, 2014   Cataract  . EYE SURGERY Left Sept.   27, 2014   Cataract  . JOINT REPLACEMENT    . TOTAL KNEE ARTHROPLASTY Left 02/25/2016   Procedure: TOTAL KNEE ARTHROPLASTY;  Surgeon: Renette Butters, MD;  Location: [place];  Service: Orthopedics;  Laterality: Left;  . TOTAL KNEE ARTHROPLASTY Right 09/29/2016   Procedure: TOTAL KNEE ARTHROPLASTY;  Surgeon: Renette Butters, MD;  Location: [place];  Service: Orthopedics;  Laterality: Right;    Allergies  Allergen Reactions  . Asa [Aspirin] Other (See Comments)    Causes bleeding Ulcer    Current Outpatient Medications  Medication Sig Dispense Refill  . atorvastatin (LIPITOR) 40 MG tablet Take 40 mg by mouth daily.     . baclofen (LIORESAL) 10 MG tablet Take 1  tablet (10 mg total) by mouth 3 (three) times daily as needed for muscle spasms. 40 each 0  . lisinopril (PRINIVIL,ZESTRIL) 10 MG tablet Take 10 mg by mouth daily at 8 pm.     . metFORMIN (GLUCOPHAGE) 500 MG tablet Take 1 tablet (500 mg total) by mouth 2 (two) times daily with a meal.    . metoprolol (LOPRESSOR) 50 MG tablet Take 50 mg by mouth 2 (two) times daily.    . Omega-3 Fatty Acids (FISH OIL) 1200 MG CAPS Take 1,200 mg by mouth 2 (two) times daily.    . pantoprazole (PROTONIX) 40 MG tablet Take 40 mg by mouth daily.    . pioglitazone (ACTOS) 15 MG tablet Take 30 mg by mouth daily at 8 pm.     . docusate sodium (COLACE) 100 MG capsule Take 1 capsule (100 mg total) by mouth 2 (two) times daily. To prevent constipation while taking pain medication. (Patient not taking: Reported on  05/23/2018) 60 capsule 0  . ondansetron (ZOFRAN) 4 MG tablet Take 1 tablet (4 mg total) by mouth every 8 (eight) hours as needed for nausea or vomiting. (Patient not taking: Reported on 05/23/2018) 40 tablet 0  . oxyCODONE-acetaminophen (ROXICET) 5-325 MG tablet Take 1-2 tablets by mouth every 4 (four) hours as needed for severe pain. (Patient not taking: Reported on 05/23/2018) 60 tablet 0  . rivaroxaban (XARELTO) 10 MG TABS tablet Take 1 tablet (10 mg total) by mouth daily. For 30 days for DVT prophylaxis (Patient not taking: Reported on 05/23/2018) 30 tablet 0   No current facility-administered medications for this visit.     Review of Systems : See HPI for pertinent positives and negatives.  Physical Examination  Vitals:   05/23/18 0932 05/23/18 0936  BP: 131/73 (!) 142/76  Pulse: 61 (!) 59  Resp: 18   Temp: (!) 97.4 F (36.3 C)   TempSrc: Oral   Weight: 266 lb (120.7 kg)   Height: 5\' 10"  (1.778 m)    Body mass index is 38.17 kg/m.  General: WDWN obese male in NAD GAIT: normal Eyes: PERRLA HENT: No gross abnormalities.  Pulmonary:  Respirations are non-labored, good air movement in all fields, CTAB, no rales, rhonchi, or wheezes. Cardiac: regular rhythm, no detected murmur.  VASCULAR EXAM Carotid Bruits Right Left   Negative Negative     Abdominal aortic pulse is not palpable. Radial pulses are 2+ palpable and equal.                                                                                                                            LE Pulses Right Left       POPLITEAL  not palpable   not palpable       POSTERIOR TIBIAL  faintly palpable   faintly palpable        DORSALIS PEDIS      ANTERIOR TIBIAL  not palpable  not palpable     Gastrointestinal:  soft, nontender, BS WNL, no r/g, no palpable masses. Musculoskeletal: no muscle atrophy/wasting. M/S 5/5 throughout, extremities without ischemic changes. Trace pitting and non pitting edema in both  ankles. Skin: No rashes, no ulcers, no cellulitis.   Neurologic:  A&O X 3; appropriate affect, sensation is normal; speech is normal, CN 2-12 intact, pain and light touch intact in extremities, motor exam as listed above. Psychiatric: Normal thought content, mood appropriate to clinical situation.    Assessment: Brad Day is a 77 y.o. male whois status post left CEA in 2007. He had 4 preoperative TIA's, none subsequently.    DATA Carotid Duplex (05-23-18): Right ICA: 1-39% stenosis Left internal carotid artery is patent with history of carotid endarterectomy, no hyperplasia or hemodynamically significant changes present. Bilateral vertebral artery flow is antegrade.  Bilateral subclavian artery waveforms are normal.  Essentially unchanged since exams on 05/02/15, 05/04/16, and 05-10-17.    Follow-up in 18 monthswith Carotid Duplex scan.  I discussed in depth with the patient the nature of atherosclerosis, and emphasized the importance of maximal medical management including strict control of blood pressure, blood glucose, and lipid levels, obtaining regular exercise, and continued cessation of smoking.  The patient is aware that without maximal medical management the underlying atherosclerotic disease process will progress, limiting the benefit of any interventions. The patient was given information about stroke prevention and what symptoms should prompt the patient to seek immediate medical care. Thank you for allowing [place] to participate in this patient's care.  Clemon Chambers, RN, MSN, FNP-C [facility] [place] Office: [phone]  Clinic Physician: Trula Slade  05/23/18 9:49 AM

## 2018-05-24 ENCOUNTER — Ambulatory Visit: Payer: Medicare Other | Admitting: Family

## 2018-05-24 ENCOUNTER — Encounter (HOSPITAL_COMMUNITY): Payer: Medicare Other

## 2018-08-01 DIAGNOSIS — M1990 Unspecified osteoarthritis, unspecified site: Secondary | ICD-10-CM | POA: Diagnosis not present

## 2018-08-01 DIAGNOSIS — K279 Peptic ulcer, site unspecified, unspecified as acute or chronic, without hemorrhage or perforation: Secondary | ICD-10-CM | POA: Diagnosis not present

## 2018-08-01 DIAGNOSIS — I10 Essential (primary) hypertension: Secondary | ICD-10-CM | POA: Diagnosis not present

## 2018-08-01 DIAGNOSIS — I779 Disorder of arteries and arterioles, unspecified: Secondary | ICD-10-CM | POA: Diagnosis not present

## 2018-08-01 DIAGNOSIS — I2510 Atherosclerotic heart disease of native coronary artery without angina pectoris: Secondary | ICD-10-CM | POA: Diagnosis not present

## 2018-08-01 DIAGNOSIS — E1122 Type 2 diabetes mellitus with diabetic chronic kidney disease: Secondary | ICD-10-CM | POA: Diagnosis not present

## 2018-08-01 DIAGNOSIS — I714 Abdominal aortic aneurysm, without rupture: Secondary | ICD-10-CM | POA: Diagnosis not present

## 2018-08-01 DIAGNOSIS — E782 Mixed hyperlipidemia: Secondary | ICD-10-CM | POA: Diagnosis not present

## 2018-08-01 DIAGNOSIS — M4800 Spinal stenosis, site unspecified: Secondary | ICD-10-CM | POA: Diagnosis not present

## 2018-12-20 DIAGNOSIS — Z961 Presence of intraocular lens: Secondary | ICD-10-CM | POA: Diagnosis not present

## 2018-12-20 DIAGNOSIS — E119 Type 2 diabetes mellitus without complications: Secondary | ICD-10-CM | POA: Diagnosis not present

## 2018-12-20 DIAGNOSIS — H52203 Unspecified astigmatism, bilateral: Secondary | ICD-10-CM | POA: Diagnosis not present

## 2019-01-30 DIAGNOSIS — E782 Mixed hyperlipidemia: Secondary | ICD-10-CM | POA: Diagnosis not present

## 2019-01-30 DIAGNOSIS — I2510 Atherosclerotic heart disease of native coronary artery without angina pectoris: Secondary | ICD-10-CM | POA: Diagnosis not present

## 2019-01-30 DIAGNOSIS — N184 Chronic kidney disease, stage 4 (severe): Secondary | ICD-10-CM | POA: Diagnosis not present

## 2019-01-30 DIAGNOSIS — E1122 Type 2 diabetes mellitus with diabetic chronic kidney disease: Secondary | ICD-10-CM | POA: Diagnosis not present

## 2019-01-30 DIAGNOSIS — J449 Chronic obstructive pulmonary disease, unspecified: Secondary | ICD-10-CM | POA: Diagnosis not present

## 2019-01-30 DIAGNOSIS — M1990 Unspecified osteoarthritis, unspecified site: Secondary | ICD-10-CM | POA: Diagnosis not present

## 2019-01-30 DIAGNOSIS — Z1211 Encounter for screening for malignant neoplasm of colon: Secondary | ICD-10-CM | POA: Diagnosis not present

## 2019-01-30 DIAGNOSIS — I10 Essential (primary) hypertension: Secondary | ICD-10-CM | POA: Diagnosis not present

## 2019-01-30 DIAGNOSIS — I714 Abdominal aortic aneurysm, without rupture: Secondary | ICD-10-CM | POA: Diagnosis not present

## 2019-01-30 DIAGNOSIS — I779 Disorder of arteries and arterioles, unspecified: Secondary | ICD-10-CM | POA: Diagnosis not present

## 2019-04-17 DIAGNOSIS — Z23 Encounter for immunization: Secondary | ICD-10-CM | POA: Diagnosis not present

## 2019-06-16 DIAGNOSIS — L03031 Cellulitis of right toe: Secondary | ICD-10-CM | POA: Diagnosis not present

## 2019-07-31 DIAGNOSIS — I10 Essential (primary) hypertension: Secondary | ICD-10-CM | POA: Diagnosis not present

## 2019-07-31 DIAGNOSIS — I779 Disorder of arteries and arterioles, unspecified: Secondary | ICD-10-CM | POA: Diagnosis not present

## 2019-07-31 DIAGNOSIS — E782 Mixed hyperlipidemia: Secondary | ICD-10-CM | POA: Diagnosis not present

## 2019-07-31 DIAGNOSIS — M4800 Spinal stenosis, site unspecified: Secondary | ICD-10-CM | POA: Diagnosis not present

## 2019-07-31 DIAGNOSIS — K279 Peptic ulcer, site unspecified, unspecified as acute or chronic, without hemorrhage or perforation: Secondary | ICD-10-CM | POA: Diagnosis not present

## 2019-07-31 DIAGNOSIS — I714 Abdominal aortic aneurysm, without rupture: Secondary | ICD-10-CM | POA: Diagnosis not present

## 2019-07-31 DIAGNOSIS — E1122 Type 2 diabetes mellitus with diabetic chronic kidney disease: Secondary | ICD-10-CM | POA: Diagnosis not present

## 2019-07-31 DIAGNOSIS — I2510 Atherosclerotic heart disease of native coronary artery without angina pectoris: Secondary | ICD-10-CM | POA: Diagnosis not present

## 2019-07-31 DIAGNOSIS — M1990 Unspecified osteoarthritis, unspecified site: Secondary | ICD-10-CM | POA: Diagnosis not present

## 2019-11-22 ENCOUNTER — Other Ambulatory Visit: Payer: Self-pay | Admitting: *Deleted

## 2019-11-22 DIAGNOSIS — I6523 Occlusion and stenosis of bilateral carotid arteries: Secondary | ICD-10-CM

## 2019-11-30 ENCOUNTER — Other Ambulatory Visit: Payer: Self-pay

## 2019-11-30 ENCOUNTER — Ambulatory Visit (HOSPITAL_COMMUNITY)
Admission: RE | Admit: 2019-11-30 | Discharge: 2019-11-30 | Disposition: A | Discharge status: Home / Self Care | Payer: Medicare HMO | Source: Ambulatory Visit | Attending: Vascular Surgery | Admitting: Vascular Surgery

## 2019-11-30 ENCOUNTER — Ambulatory Visit (INDEPENDENT_AMBULATORY_CARE_PROVIDER_SITE_OTHER): Payer: Medicare HMO | Admitting: Physician Assistant

## 2019-11-30 VITALS — BP 141/77 | HR 52 | Temp 98.0°F | Resp 20 | Ht 70.0 in | Wt 267.0 lb

## 2019-11-30 DIAGNOSIS — I6523 Occlusion and stenosis of bilateral carotid arteries: Secondary | ICD-10-CM

## 2019-11-30 NOTE — Progress Notes (Signed)
History of Present Illness:  Patient is a 79 y.o. year old male who presents for evaluation of carotid stenosis.  S/P post symptomatic left CEA 2007 by Dr. Oneida Alar.   Symptoms related to this stenosis include history of TIA with expressive aphasia.  The patient denies symptoms of TIA, amaurosis, or stroke.    Past Medical History:  Diagnosis Date  . Arthritis   . Carotid artery occlusion   . Coronary artery disease   . Diabetes mellitus without complication ([place])   . Fall down embankment Oct. 26, 2016   Hurt Right shoulder  . GERD (gastroesophageal reflux disease)   . History of bleeding ulcers   . Hypertension   . Shortness of breath dyspnea    with exertion  . Stroke [facility])    4 mini strokes    Past Surgical History:  Procedure Laterality Date  . CAROTID ENDARTERECTOMY  2007   Left CEA  . CORONARY ARTERY BYPASS GRAFT  2001   Dr. Cyndia Bent  . EYE SURGERY Right Sept.  1, 2014   Cataract  . EYE SURGERY Left Sept.   27, 2014   Cataract  . JOINT REPLACEMENT    . TOTAL KNEE ARTHROPLASTY Left 02/25/2016   Procedure: TOTAL KNEE ARTHROPLASTY;  Surgeon: Renette Butters, MD;  Location: [facility];  Service: Orthopedics;  Laterality: Left;  . TOTAL KNEE ARTHROPLASTY Right 09/29/2016   Procedure: TOTAL KNEE ARTHROPLASTY;  Surgeon: Renette Butters, MD;  Location: [place];  Service: Orthopedics;  Laterality: Right;     Social History Social History   Tobacco Use  . Smoking status: Former Smoker    Types: Cigarettes    Quit date: 07/06/1976    Years since quitting: 43.4  . Smokeless tobacco: Former Systems developer    Types: Chew  Substance Use Topics  . Alcohol use: No  . Drug use: No    Family History Family History  Problem Relation Age of Onset  . Stroke Mother   . Heart disease Father        Heart Disease before age 2  . Heart attack Father        X's 35  . Emphysema Sister   . Other Brother        train accident   . Cancer Brother   . Alzheimer's disease Brother      Allergies  Allergies  Allergen Reactions  . Asa [Aspirin] Other (See Comments)    Causes bleeding Ulcer     Current Outpatient Medications  Medication Sig Dispense Refill  . atorvastatin (LIPITOR) 40 MG tablet Take 40 mg by mouth daily.     . baclofen (LIORESAL) 10 MG tablet Take 1 tablet (10 mg total) by mouth 3 (three) times daily as needed for muscle spasms. 40 each 0  . lisinopril (PRINIVIL,ZESTRIL) 10 MG tablet Take 10 mg by mouth daily at 8 pm.     . metFORMIN (GLUCOPHAGE) 500 MG tablet Take 1 tablet (500 mg total) by mouth 2 (two) times daily with a meal.    . metoprolol (LOPRESSOR) 50 MG tablet Take 50 mg by mouth 2 (two) times daily.    . Omega-3 Fatty Acids (FISH OIL) 1200 MG CAPS Take 1,200 mg by mouth 2 (two) times daily.    . pantoprazole (PROTONIX) 40 MG tablet Take 40 mg by mouth daily.    . pioglitazone (ACTOS) 15 MG tablet Take 30 mg by mouth daily at 8 pm.     .  rivaroxaban (XARELTO) 10 MG TABS tablet Take 1 tablet (10 mg total) by mouth daily. For 30 days for DVT prophylaxis (Patient not taking: Reported on 05/23/2018) 30 tablet 0   No current facility-administered medications for this visit.    ROS:   General:  No weight loss, Fever, chills  HEENT: No recent headaches, no nasal bleeding, no visual changes, no sore throat  Neurologic: No dizziness, blackouts, seizures. No recent symptoms of stroke or mini- stroke. No recent episodes of slurred speech, or temporary blindness.  Cardiac: No recent episodes of chest pain/pressure, no shortness of breath at rest.  No shortness of breath with exertion.  Denies history of atrial fibrillation or irregular heartbeat  Vascular: No history of rest pain in feet.  No history of claudication.  No history of non-healing ulcer, No history of DVT   Pulmonary: No home oxygen, no productive cough, no hemoptysis,  No asthma or wheezing  Musculoskeletal:  [ ]  Arthritis, [ ]  Low back pain,  [x ] Joint pain  Hematologic:No  history of hypercoagulable state.  No history of easy bleeding.  No history of anemia  Gastrointestinal: No hematochezia or melena,  No gastroesophageal reflux, no trouble swallowing  Urinary: [ ]  chronic Kidney disease, [ ]  on HD - [ ]  MWF or [ ]  TTHS, [ ]  Burning with urination, [ ]  Frequent urination, [ ]  Difficulty urinating;   Skin: No rashes  Psychological: No history of anxiety,  No history of depression   Physical Examination  Vitals:   11/30/19 1055 11/30/19 1059  BP: 138/75 (!) 141/77  Pulse: (!) 52   Resp: 20   Temp: 98 F (36.7 C)   SpO2: 97%   Weight: 267 lb (121.1 kg)   Height: 5\' 10"  (1.778 m)     Body mass index is 38.31 kg/m.  General:  Alert and oriented, no acute distress HEENT: Normal Neck: No bruit or JVD Pulmonary: Clear to auscultation bilaterally Cardiac: Regular Rate and Rhythm without murmur Gastrointestinal: Soft, non-tender, non-distended, no mass, no scars Skin: No rash Extremity Pulses:  2+ radial, brachial B UE Musculoskeletal: No deformity or edema  Neurologic: Upper and lower extremity motor 5/5 and symmetric  DATA:    Right Carotid Findings:  +----------+--------+-------+--------+----------------------+--------------  ----+       PSV cm/sEDV  StenosisPlaque Description  Comments                 cm/s                             +----------+--------+-------+--------+----------------------+--------------  ----+  CCA Prox 78   13                  intimal  thickening  +----------+--------+-------+--------+----------------------+--------------  ----+  CCA Mid  71   17                              +----------+--------+-------+--------+----------------------+--------------  ----+  CCA Distal73   18       smooth                      +----------+--------+-------+--------+----------------------+--------------  ----+  ICA Prox 69   21   1-39%  smooth and  heterogenous                 +----------+--------+-------+--------+----------------------+--------------  ----+  ICA Mid  86   31                              +----------+--------+-------+--------+----------------------+--------------  ----+  ICA Distal81   22                              +----------+--------+-------+--------+----------------------+--------------  ----+  ECA    113   0                              +----------+--------+-------+--------+----------------------+--------------  ----+   +----------+--------+-------+----------------+-------------------+       PSV cm/sEDV cmsDescribe    Arm Pressure (mmHG)  +----------+--------+-------+----------------+-------------------+  [id number]   0   Multiphasic, WNL            +----------+--------+-------+----------------+-------------------+   +---------+--------+--+--------+-+---------+  VertebralPSV cm/s21EDV cm/s6Antegrade  +---------+--------+--+--------+-+---------+      Left Carotid Findings:  +----------+--------+--------+--------+----------------------+--------+       PSV cm/sEDV cm/sStenosisPlaque Description  Comments  +----------+--------+--------+--------+----------------------+--------+  CCA Prox 80   19                        +----------+--------+--------+--------+----------------------+--------+  CCA Mid  84   18                        +----------+--------+--------+--------+----------------------+--------+  CCA Distal60   15        homogeneous and smooth      +----------+--------+--------+--------+----------------------+--------+  ICA Prox 59   15   1-39%  heterogenous           +----------+--------+--------+--------+----------------------+--------+  ICA Mid  64   26                        +----------+--------+--------+--------+----------------------+--------+  ICA Distal56   21                        +----------+--------+--------+--------+----------------------+--------+  ECA    135   6    >50%                   +----------+--------+--------+--------+----------------------+--------+   +----------+--------+--------+----------------+-------------------+       PSV cm/sEDV cm/sDescribe    Arm Pressure (mmHG)  +----------+--------+--------+----------------+-------------------+  [id number]   0    Multiphasic, WNL            +----------+--------+--------+----------------+-------------------+   +---------+--------+--+--------+--+---------+  VertebralPSV cm/s47EDV cm/s13Antegrade  +---------+--------+--+--------+--+---------+        Summary:  Right Carotid: Velocities in the right ICA are consistent with a 1-39%  stenosis.   Left Carotid: Velocities in the left ICA are consistent with a 1-39%  stenosis.        The ECA appears >50% stenosed.   ASSESSMENT:  Symptomatic carotid stenosis s/p left CEA He denise new symptoms of stroke/TIA.     PLAN:  His duplex shows no progression of carotid stenosis.  We reviewed symptoms of stroke and TIA.  If he has these he will call 911.  He will f/u with our office in 2 years for repeat carotid duplex studies.     Roxy Horseman PA-C [facility] [place] Office: [phone]  MD in clinic [place]

## 2019-12-19 DIAGNOSIS — I10 Essential (primary) hypertension: Secondary | ICD-10-CM | POA: Diagnosis not present

## 2019-12-19 DIAGNOSIS — Z6836 Body mass index (BMI) 36.0-36.9, adult: Secondary | ICD-10-CM | POA: Diagnosis not present

## 2019-12-19 DIAGNOSIS — Z7984 Long term (current) use of oral hypoglycemic drugs: Secondary | ICD-10-CM | POA: Diagnosis not present

## 2019-12-19 DIAGNOSIS — E785 Hyperlipidemia, unspecified: Secondary | ICD-10-CM | POA: Diagnosis not present

## 2019-12-19 DIAGNOSIS — I2510 Atherosclerotic heart disease of native coronary artery without angina pectoris: Secondary | ICD-10-CM | POA: Diagnosis not present

## 2019-12-19 DIAGNOSIS — E119 Type 2 diabetes mellitus without complications: Secondary | ICD-10-CM | POA: Diagnosis not present

## 2019-12-19 DIAGNOSIS — K219 Gastro-esophageal reflux disease without esophagitis: Secondary | ICD-10-CM | POA: Diagnosis not present

## 2019-12-19 DIAGNOSIS — M1990 Unspecified osteoarthritis, unspecified site: Secondary | ICD-10-CM | POA: Diagnosis not present

## 2019-12-19 DIAGNOSIS — Z7722 Contact with and (suspected) exposure to environmental tobacco smoke (acute) (chronic): Secondary | ICD-10-CM | POA: Diagnosis not present

## 2019-12-19 DIAGNOSIS — E669 Obesity, unspecified: Secondary | ICD-10-CM | POA: Diagnosis not present

## 2020-01-31 DIAGNOSIS — I1310 Hypertensive heart and chronic kidney disease without heart failure, with stage 1 through stage 4 chronic kidney disease, or unspecified chronic kidney disease: Secondary | ICD-10-CM | POA: Diagnosis not present

## 2020-01-31 DIAGNOSIS — N183 Chronic kidney disease, stage 3 unspecified: Secondary | ICD-10-CM | POA: Diagnosis not present

## 2020-01-31 DIAGNOSIS — N184 Chronic kidney disease, stage 4 (severe): Secondary | ICD-10-CM | POA: Diagnosis not present

## 2020-01-31 DIAGNOSIS — J449 Chronic obstructive pulmonary disease, unspecified: Secondary | ICD-10-CM | POA: Diagnosis not present

## 2020-01-31 DIAGNOSIS — M1990 Unspecified osteoarthritis, unspecified site: Secondary | ICD-10-CM | POA: Diagnosis not present

## 2020-01-31 DIAGNOSIS — I10 Essential (primary) hypertension: Secondary | ICD-10-CM | POA: Diagnosis not present

## 2020-01-31 DIAGNOSIS — E782 Mixed hyperlipidemia: Secondary | ICD-10-CM | POA: Diagnosis not present

## 2020-01-31 DIAGNOSIS — I2510 Atherosclerotic heart disease of native coronary artery without angina pectoris: Secondary | ICD-10-CM | POA: Diagnosis not present

## 2020-01-31 DIAGNOSIS — E1122 Type 2 diabetes mellitus with diabetic chronic kidney disease: Secondary | ICD-10-CM | POA: Diagnosis not present

## 2020-02-01 DIAGNOSIS — I10 Essential (primary) hypertension: Secondary | ICD-10-CM | POA: Diagnosis not present

## 2020-02-01 DIAGNOSIS — Z1211 Encounter for screening for malignant neoplasm of colon: Secondary | ICD-10-CM | POA: Diagnosis not present

## 2020-02-01 DIAGNOSIS — E1122 Type 2 diabetes mellitus with diabetic chronic kidney disease: Secondary | ICD-10-CM | POA: Diagnosis not present

## 2020-02-01 DIAGNOSIS — E782 Mixed hyperlipidemia: Secondary | ICD-10-CM | POA: Diagnosis not present

## 2020-02-01 DIAGNOSIS — I1310 Hypertensive heart and chronic kidney disease without heart failure, with stage 1 through stage 4 chronic kidney disease, or unspecified chronic kidney disease: Secondary | ICD-10-CM | POA: Diagnosis not present

## 2020-02-01 DIAGNOSIS — N184 Chronic kidney disease, stage 4 (severe): Secondary | ICD-10-CM | POA: Diagnosis not present

## 2020-02-01 DIAGNOSIS — I2510 Atherosclerotic heart disease of native coronary artery without angina pectoris: Secondary | ICD-10-CM | POA: Diagnosis not present

## 2020-04-09 DIAGNOSIS — R69 Illness, unspecified: Secondary | ICD-10-CM | POA: Diagnosis not present

## 2020-05-05 DIAGNOSIS — M1990 Unspecified osteoarthritis, unspecified site: Secondary | ICD-10-CM | POA: Diagnosis not present

## 2020-05-05 DIAGNOSIS — I1310 Hypertensive heart and chronic kidney disease without heart failure, with stage 1 through stage 4 chronic kidney disease, or unspecified chronic kidney disease: Secondary | ICD-10-CM | POA: Diagnosis not present

## 2020-05-05 DIAGNOSIS — I10 Essential (primary) hypertension: Secondary | ICD-10-CM | POA: Diagnosis not present

## 2020-05-05 DIAGNOSIS — E1122 Type 2 diabetes mellitus with diabetic chronic kidney disease: Secondary | ICD-10-CM | POA: Diagnosis not present

## 2020-05-05 DIAGNOSIS — N184 Chronic kidney disease, stage 4 (severe): Secondary | ICD-10-CM | POA: Diagnosis not present

## 2020-05-05 DIAGNOSIS — I2510 Atherosclerotic heart disease of native coronary artery without angina pectoris: Secondary | ICD-10-CM | POA: Diagnosis not present

## 2020-05-05 DIAGNOSIS — J449 Chronic obstructive pulmonary disease, unspecified: Secondary | ICD-10-CM | POA: Diagnosis not present

## 2020-05-05 DIAGNOSIS — E782 Mixed hyperlipidemia: Secondary | ICD-10-CM | POA: Diagnosis not present

## 2020-06-03 DIAGNOSIS — I10 Essential (primary) hypertension: Secondary | ICD-10-CM | POA: Diagnosis not present

## 2020-06-03 DIAGNOSIS — E782 Mixed hyperlipidemia: Secondary | ICD-10-CM | POA: Diagnosis not present

## 2020-06-03 DIAGNOSIS — N184 Chronic kidney disease, stage 4 (severe): Secondary | ICD-10-CM | POA: Diagnosis not present

## 2020-06-03 DIAGNOSIS — E1122 Type 2 diabetes mellitus with diabetic chronic kidney disease: Secondary | ICD-10-CM | POA: Diagnosis not present

## 2020-06-03 DIAGNOSIS — I1310 Hypertensive heart and chronic kidney disease without heart failure, with stage 1 through stage 4 chronic kidney disease, or unspecified chronic kidney disease: Secondary | ICD-10-CM | POA: Diagnosis not present

## 2020-06-03 DIAGNOSIS — N183 Chronic kidney disease, stage 3 unspecified: Secondary | ICD-10-CM | POA: Diagnosis not present

## 2020-06-03 DIAGNOSIS — M1990 Unspecified osteoarthritis, unspecified site: Secondary | ICD-10-CM | POA: Diagnosis not present

## 2020-06-03 DIAGNOSIS — J449 Chronic obstructive pulmonary disease, unspecified: Secondary | ICD-10-CM | POA: Diagnosis not present

## 2020-06-03 DIAGNOSIS — I2510 Atherosclerotic heart disease of native coronary artery without angina pectoris: Secondary | ICD-10-CM | POA: Diagnosis not present

## 2020-06-26 DIAGNOSIS — E1122 Type 2 diabetes mellitus with diabetic chronic kidney disease: Secondary | ICD-10-CM | POA: Diagnosis not present

## 2020-06-26 DIAGNOSIS — I2510 Atherosclerotic heart disease of native coronary artery without angina pectoris: Secondary | ICD-10-CM | POA: Diagnosis not present

## 2020-06-26 DIAGNOSIS — E782 Mixed hyperlipidemia: Secondary | ICD-10-CM | POA: Diagnosis not present

## 2020-06-26 DIAGNOSIS — I1310 Hypertensive heart and chronic kidney disease without heart failure, with stage 1 through stage 4 chronic kidney disease, or unspecified chronic kidney disease: Secondary | ICD-10-CM | POA: Diagnosis not present

## 2020-06-26 DIAGNOSIS — J449 Chronic obstructive pulmonary disease, unspecified: Secondary | ICD-10-CM | POA: Diagnosis not present

## 2020-06-26 DIAGNOSIS — N184 Chronic kidney disease, stage 4 (severe): Secondary | ICD-10-CM | POA: Diagnosis not present

## 2020-06-26 DIAGNOSIS — I10 Essential (primary) hypertension: Secondary | ICD-10-CM | POA: Diagnosis not present

## 2020-06-26 DIAGNOSIS — M1990 Unspecified osteoarthritis, unspecified site: Secondary | ICD-10-CM | POA: Diagnosis not present

## 2020-08-01 DIAGNOSIS — Z0184 Encounter for antibody response examination: Secondary | ICD-10-CM | POA: Diagnosis not present

## 2020-08-01 DIAGNOSIS — K279 Peptic ulcer, site unspecified, unspecified as acute or chronic, without hemorrhage or perforation: Secondary | ICD-10-CM | POA: Diagnosis not present

## 2020-08-01 DIAGNOSIS — Z0000 Encounter for general adult medical examination without abnormal findings: Secondary | ICD-10-CM | POA: Diagnosis not present

## 2020-08-01 DIAGNOSIS — N184 Chronic kidney disease, stage 4 (severe): Secondary | ICD-10-CM | POA: Diagnosis not present

## 2020-08-01 DIAGNOSIS — Z7984 Long term (current) use of oral hypoglycemic drugs: Secondary | ICD-10-CM | POA: Diagnosis not present

## 2020-08-01 DIAGNOSIS — Z1159 Encounter for screening for other viral diseases: Secondary | ICD-10-CM | POA: Diagnosis not present

## 2020-08-01 DIAGNOSIS — I10 Essential (primary) hypertension: Secondary | ICD-10-CM | POA: Diagnosis not present

## 2020-08-01 DIAGNOSIS — E1122 Type 2 diabetes mellitus with diabetic chronic kidney disease: Secondary | ICD-10-CM | POA: Diagnosis not present

## 2020-08-01 DIAGNOSIS — E782 Mixed hyperlipidemia: Secondary | ICD-10-CM | POA: Diagnosis not present

## 2020-08-27 DIAGNOSIS — I1310 Hypertensive heart and chronic kidney disease without heart failure, with stage 1 through stage 4 chronic kidney disease, or unspecified chronic kidney disease: Secondary | ICD-10-CM | POA: Diagnosis not present

## 2020-08-27 DIAGNOSIS — I2510 Atherosclerotic heart disease of native coronary artery without angina pectoris: Secondary | ICD-10-CM | POA: Diagnosis not present

## 2020-08-27 DIAGNOSIS — J449 Chronic obstructive pulmonary disease, unspecified: Secondary | ICD-10-CM | POA: Diagnosis not present

## 2020-08-27 DIAGNOSIS — N184 Chronic kidney disease, stage 4 (severe): Secondary | ICD-10-CM | POA: Diagnosis not present

## 2020-08-27 DIAGNOSIS — E782 Mixed hyperlipidemia: Secondary | ICD-10-CM | POA: Diagnosis not present

## 2020-08-27 DIAGNOSIS — M1990 Unspecified osteoarthritis, unspecified site: Secondary | ICD-10-CM | POA: Diagnosis not present

## 2020-08-27 DIAGNOSIS — I10 Essential (primary) hypertension: Secondary | ICD-10-CM | POA: Diagnosis not present

## 2020-08-27 DIAGNOSIS — E1122 Type 2 diabetes mellitus with diabetic chronic kidney disease: Secondary | ICD-10-CM | POA: Diagnosis not present

## 2020-10-01 DIAGNOSIS — I10 Essential (primary) hypertension: Secondary | ICD-10-CM | POA: Diagnosis not present

## 2020-10-01 DIAGNOSIS — J449 Chronic obstructive pulmonary disease, unspecified: Secondary | ICD-10-CM | POA: Diagnosis not present

## 2020-10-01 DIAGNOSIS — E1122 Type 2 diabetes mellitus with diabetic chronic kidney disease: Secondary | ICD-10-CM | POA: Diagnosis not present

## 2020-10-01 DIAGNOSIS — I1310 Hypertensive heart and chronic kidney disease without heart failure, with stage 1 through stage 4 chronic kidney disease, or unspecified chronic kidney disease: Secondary | ICD-10-CM | POA: Diagnosis not present

## 2020-10-01 DIAGNOSIS — E782 Mixed hyperlipidemia: Secondary | ICD-10-CM | POA: Diagnosis not present

## 2020-10-01 DIAGNOSIS — N184 Chronic kidney disease, stage 4 (severe): Secondary | ICD-10-CM | POA: Diagnosis not present

## 2020-10-01 DIAGNOSIS — M1990 Unspecified osteoarthritis, unspecified site: Secondary | ICD-10-CM | POA: Diagnosis not present

## 2020-10-01 DIAGNOSIS — I2510 Atherosclerotic heart disease of native coronary artery without angina pectoris: Secondary | ICD-10-CM | POA: Diagnosis not present

## 2020-10-01 DIAGNOSIS — N183 Chronic kidney disease, stage 3 unspecified: Secondary | ICD-10-CM | POA: Diagnosis not present

## 2020-10-31 DIAGNOSIS — E1122 Type 2 diabetes mellitus with diabetic chronic kidney disease: Secondary | ICD-10-CM | POA: Diagnosis not present

## 2020-10-31 DIAGNOSIS — I10 Essential (primary) hypertension: Secondary | ICD-10-CM | POA: Diagnosis not present

## 2020-10-31 DIAGNOSIS — J449 Chronic obstructive pulmonary disease, unspecified: Secondary | ICD-10-CM | POA: Diagnosis not present

## 2020-10-31 DIAGNOSIS — N184 Chronic kidney disease, stage 4 (severe): Secondary | ICD-10-CM | POA: Diagnosis not present

## 2020-10-31 DIAGNOSIS — I1310 Hypertensive heart and chronic kidney disease without heart failure, with stage 1 through stage 4 chronic kidney disease, or unspecified chronic kidney disease: Secondary | ICD-10-CM | POA: Diagnosis not present

## 2020-10-31 DIAGNOSIS — E782 Mixed hyperlipidemia: Secondary | ICD-10-CM | POA: Diagnosis not present

## 2020-10-31 DIAGNOSIS — I2510 Atherosclerotic heart disease of native coronary artery without angina pectoris: Secondary | ICD-10-CM | POA: Diagnosis not present

## 2020-10-31 DIAGNOSIS — M1990 Unspecified osteoarthritis, unspecified site: Secondary | ICD-10-CM | POA: Diagnosis not present

## 2020-12-18 DIAGNOSIS — E782 Mixed hyperlipidemia: Secondary | ICD-10-CM | POA: Diagnosis not present

## 2020-12-18 DIAGNOSIS — I2510 Atherosclerotic heart disease of native coronary artery without angina pectoris: Secondary | ICD-10-CM | POA: Diagnosis not present

## 2020-12-18 DIAGNOSIS — M1990 Unspecified osteoarthritis, unspecified site: Secondary | ICD-10-CM | POA: Diagnosis not present

## 2020-12-18 DIAGNOSIS — E1122 Type 2 diabetes mellitus with diabetic chronic kidney disease: Secondary | ICD-10-CM | POA: Diagnosis not present

## 2020-12-18 DIAGNOSIS — I1310 Hypertensive heart and chronic kidney disease without heart failure, with stage 1 through stage 4 chronic kidney disease, or unspecified chronic kidney disease: Secondary | ICD-10-CM | POA: Diagnosis not present

## 2020-12-18 DIAGNOSIS — N184 Chronic kidney disease, stage 4 (severe): Secondary | ICD-10-CM | POA: Diagnosis not present

## 2020-12-18 DIAGNOSIS — J449 Chronic obstructive pulmonary disease, unspecified: Secondary | ICD-10-CM | POA: Diagnosis not present

## 2020-12-18 DIAGNOSIS — I10 Essential (primary) hypertension: Secondary | ICD-10-CM | POA: Diagnosis not present

## 2021-01-15 DIAGNOSIS — E782 Mixed hyperlipidemia: Secondary | ICD-10-CM | POA: Diagnosis not present

## 2021-01-15 DIAGNOSIS — I10 Essential (primary) hypertension: Secondary | ICD-10-CM | POA: Diagnosis not present

## 2021-01-15 DIAGNOSIS — N184 Chronic kidney disease, stage 4 (severe): Secondary | ICD-10-CM | POA: Diagnosis not present

## 2021-01-15 DIAGNOSIS — J449 Chronic obstructive pulmonary disease, unspecified: Secondary | ICD-10-CM | POA: Diagnosis not present

## 2021-01-15 DIAGNOSIS — I1310 Hypertensive heart and chronic kidney disease without heart failure, with stage 1 through stage 4 chronic kidney disease, or unspecified chronic kidney disease: Secondary | ICD-10-CM | POA: Diagnosis not present

## 2021-01-15 DIAGNOSIS — M1990 Unspecified osteoarthritis, unspecified site: Secondary | ICD-10-CM | POA: Diagnosis not present

## 2021-01-15 DIAGNOSIS — E1122 Type 2 diabetes mellitus with diabetic chronic kidney disease: Secondary | ICD-10-CM | POA: Diagnosis not present

## 2021-01-15 DIAGNOSIS — I2510 Atherosclerotic heart disease of native coronary artery without angina pectoris: Secondary | ICD-10-CM | POA: Diagnosis not present

## 2021-01-29 DIAGNOSIS — K279 Peptic ulcer, site unspecified, unspecified as acute or chronic, without hemorrhage or perforation: Secondary | ICD-10-CM | POA: Diagnosis not present

## 2021-01-29 DIAGNOSIS — J449 Chronic obstructive pulmonary disease, unspecified: Secondary | ICD-10-CM | POA: Diagnosis not present

## 2021-01-29 DIAGNOSIS — Z7984 Long term (current) use of oral hypoglycemic drugs: Secondary | ICD-10-CM | POA: Diagnosis not present

## 2021-01-29 DIAGNOSIS — I10 Essential (primary) hypertension: Secondary | ICD-10-CM | POA: Diagnosis not present

## 2021-01-29 DIAGNOSIS — E782 Mixed hyperlipidemia: Secondary | ICD-10-CM | POA: Diagnosis not present

## 2021-01-29 DIAGNOSIS — E1122 Type 2 diabetes mellitus with diabetic chronic kidney disease: Secondary | ICD-10-CM | POA: Diagnosis not present

## 2021-02-05 DIAGNOSIS — E119 Type 2 diabetes mellitus without complications: Secondary | ICD-10-CM | POA: Diagnosis not present

## 2021-02-05 DIAGNOSIS — H52223 Regular astigmatism, bilateral: Secondary | ICD-10-CM | POA: Diagnosis not present

## 2021-02-05 DIAGNOSIS — Z961 Presence of intraocular lens: Secondary | ICD-10-CM | POA: Diagnosis not present

## 2021-02-16 DIAGNOSIS — N184 Chronic kidney disease, stage 4 (severe): Secondary | ICD-10-CM | POA: Diagnosis not present

## 2021-02-16 DIAGNOSIS — I10 Essential (primary) hypertension: Secondary | ICD-10-CM | POA: Diagnosis not present

## 2021-02-16 DIAGNOSIS — E782 Mixed hyperlipidemia: Secondary | ICD-10-CM | POA: Diagnosis not present

## 2021-02-16 DIAGNOSIS — I2510 Atherosclerotic heart disease of native coronary artery without angina pectoris: Secondary | ICD-10-CM | POA: Diagnosis not present

## 2021-02-16 DIAGNOSIS — J449 Chronic obstructive pulmonary disease, unspecified: Secondary | ICD-10-CM | POA: Diagnosis not present

## 2021-02-16 DIAGNOSIS — E1122 Type 2 diabetes mellitus with diabetic chronic kidney disease: Secondary | ICD-10-CM | POA: Diagnosis not present

## 2021-02-16 DIAGNOSIS — M1990 Unspecified osteoarthritis, unspecified site: Secondary | ICD-10-CM | POA: Diagnosis not present

## 2021-02-16 DIAGNOSIS — I1310 Hypertensive heart and chronic kidney disease without heart failure, with stage 1 through stage 4 chronic kidney disease, or unspecified chronic kidney disease: Secondary | ICD-10-CM | POA: Diagnosis not present

## 2021-03-12 DIAGNOSIS — J449 Chronic obstructive pulmonary disease, unspecified: Secondary | ICD-10-CM | POA: Diagnosis not present

## 2021-03-12 DIAGNOSIS — I1310 Hypertensive heart and chronic kidney disease without heart failure, with stage 1 through stage 4 chronic kidney disease, or unspecified chronic kidney disease: Secondary | ICD-10-CM | POA: Diagnosis not present

## 2021-03-12 DIAGNOSIS — I2510 Atherosclerotic heart disease of native coronary artery without angina pectoris: Secondary | ICD-10-CM | POA: Diagnosis not present

## 2021-03-12 DIAGNOSIS — E1122 Type 2 diabetes mellitus with diabetic chronic kidney disease: Secondary | ICD-10-CM | POA: Diagnosis not present

## 2021-03-12 DIAGNOSIS — E782 Mixed hyperlipidemia: Secondary | ICD-10-CM | POA: Diagnosis not present

## 2021-03-12 DIAGNOSIS — M1990 Unspecified osteoarthritis, unspecified site: Secondary | ICD-10-CM | POA: Diagnosis not present

## 2021-03-12 DIAGNOSIS — N184 Chronic kidney disease, stage 4 (severe): Secondary | ICD-10-CM | POA: Diagnosis not present

## 2021-03-12 DIAGNOSIS — I10 Essential (primary) hypertension: Secondary | ICD-10-CM | POA: Diagnosis not present

## 2021-07-31 DIAGNOSIS — N184 Chronic kidney disease, stage 4 (severe): Secondary | ICD-10-CM | POA: Diagnosis not present

## 2021-07-31 DIAGNOSIS — Z7984 Long term (current) use of oral hypoglycemic drugs: Secondary | ICD-10-CM | POA: Diagnosis not present

## 2021-07-31 DIAGNOSIS — I1310 Hypertensive heart and chronic kidney disease without heart failure, with stage 1 through stage 4 chronic kidney disease, or unspecified chronic kidney disease: Secondary | ICD-10-CM | POA: Diagnosis not present

## 2021-07-31 DIAGNOSIS — I2510 Atherosclerotic heart disease of native coronary artery without angina pectoris: Secondary | ICD-10-CM | POA: Diagnosis not present

## 2021-07-31 DIAGNOSIS — E782 Mixed hyperlipidemia: Secondary | ICD-10-CM | POA: Diagnosis not present

## 2021-07-31 DIAGNOSIS — J449 Chronic obstructive pulmonary disease, unspecified: Secondary | ICD-10-CM | POA: Diagnosis not present

## 2021-07-31 DIAGNOSIS — Z0000 Encounter for general adult medical examination without abnormal findings: Secondary | ICD-10-CM | POA: Diagnosis not present

## 2021-07-31 DIAGNOSIS — K279 Peptic ulcer, site unspecified, unspecified as acute or chronic, without hemorrhage or perforation: Secondary | ICD-10-CM | POA: Diagnosis not present

## 2021-07-31 DIAGNOSIS — I713 Abdominal aortic aneurysm, ruptured, unspecified: Secondary | ICD-10-CM | POA: Diagnosis not present

## 2021-07-31 DIAGNOSIS — I10 Essential (primary) hypertension: Secondary | ICD-10-CM | POA: Diagnosis not present

## 2021-07-31 DIAGNOSIS — E1122 Type 2 diabetes mellitus with diabetic chronic kidney disease: Secondary | ICD-10-CM | POA: Diagnosis not present

## 2022-01-29 DIAGNOSIS — I2510 Atherosclerotic heart disease of native coronary artery without angina pectoris: Secondary | ICD-10-CM | POA: Diagnosis not present

## 2022-01-29 DIAGNOSIS — J449 Chronic obstructive pulmonary disease, unspecified: Secondary | ICD-10-CM | POA: Diagnosis not present

## 2022-01-29 DIAGNOSIS — I10 Essential (primary) hypertension: Secondary | ICD-10-CM | POA: Diagnosis not present

## 2022-01-29 DIAGNOSIS — N184 Chronic kidney disease, stage 4 (severe): Secondary | ICD-10-CM | POA: Diagnosis not present

## 2022-01-29 DIAGNOSIS — M1990 Unspecified osteoarthritis, unspecified site: Secondary | ICD-10-CM | POA: Diagnosis not present

## 2022-01-29 DIAGNOSIS — I1310 Hypertensive heart and chronic kidney disease without heart failure, with stage 1 through stage 4 chronic kidney disease, or unspecified chronic kidney disease: Secondary | ICD-10-CM | POA: Diagnosis not present

## 2022-01-29 DIAGNOSIS — E1122 Type 2 diabetes mellitus with diabetic chronic kidney disease: Secondary | ICD-10-CM | POA: Diagnosis not present

## 2022-01-29 DIAGNOSIS — Z7984 Long term (current) use of oral hypoglycemic drugs: Secondary | ICD-10-CM | POA: Diagnosis not present

## 2022-01-29 DIAGNOSIS — K279 Peptic ulcer, site unspecified, unspecified as acute or chronic, without hemorrhage or perforation: Secondary | ICD-10-CM | POA: Diagnosis not present

## 2022-01-29 DIAGNOSIS — D631 Anemia in chronic kidney disease: Secondary | ICD-10-CM | POA: Diagnosis not present

## 2022-01-29 DIAGNOSIS — I713 Abdominal aortic aneurysm, ruptured, unspecified: Secondary | ICD-10-CM | POA: Diagnosis not present

## 2022-01-29 DIAGNOSIS — E782 Mixed hyperlipidemia: Secondary | ICD-10-CM | POA: Diagnosis not present

## 2022-01-29 DIAGNOSIS — D649 Anemia, unspecified: Secondary | ICD-10-CM | POA: Diagnosis not present

## 2022-02-10 DIAGNOSIS — E119 Type 2 diabetes mellitus without complications: Secondary | ICD-10-CM | POA: Diagnosis not present

## 2022-02-10 DIAGNOSIS — Z961 Presence of intraocular lens: Secondary | ICD-10-CM | POA: Diagnosis not present

## 2022-02-10 DIAGNOSIS — H52223 Regular astigmatism, bilateral: Secondary | ICD-10-CM | POA: Diagnosis not present

## 2022-07-30 DIAGNOSIS — D631 Anemia in chronic kidney disease: Secondary | ICD-10-CM | POA: Diagnosis not present

## 2022-07-30 DIAGNOSIS — N184 Chronic kidney disease, stage 4 (severe): Secondary | ICD-10-CM | POA: Diagnosis not present

## 2022-07-30 DIAGNOSIS — Z0000 Encounter for general adult medical examination without abnormal findings: Secondary | ICD-10-CM | POA: Diagnosis not present

## 2022-07-30 DIAGNOSIS — M79672 Pain in left foot: Secondary | ICD-10-CM | POA: Diagnosis not present

## 2022-07-30 DIAGNOSIS — I7143 Infrarenal abdominal aortic aneurysm, without rupture: Secondary | ICD-10-CM | POA: Diagnosis not present

## 2022-07-30 DIAGNOSIS — E782 Mixed hyperlipidemia: Secondary | ICD-10-CM | POA: Diagnosis not present

## 2022-07-30 DIAGNOSIS — E1122 Type 2 diabetes mellitus with diabetic chronic kidney disease: Secondary | ICD-10-CM | POA: Diagnosis not present

## 2022-07-30 DIAGNOSIS — I679 Cerebrovascular disease, unspecified: Secondary | ICD-10-CM | POA: Diagnosis not present

## 2022-07-30 DIAGNOSIS — I1310 Hypertensive heart and chronic kidney disease without heart failure, with stage 1 through stage 4 chronic kidney disease, or unspecified chronic kidney disease: Secondary | ICD-10-CM | POA: Diagnosis not present

## 2022-07-30 DIAGNOSIS — J449 Chronic obstructive pulmonary disease, unspecified: Secondary | ICD-10-CM | POA: Diagnosis not present

## 2022-07-30 DIAGNOSIS — Z23 Encounter for immunization: Secondary | ICD-10-CM | POA: Diagnosis not present

## 2022-07-30 DIAGNOSIS — I2510 Atherosclerotic heart disease of native coronary artery without angina pectoris: Secondary | ICD-10-CM | POA: Diagnosis not present

## 2022-08-11 ENCOUNTER — Other Ambulatory Visit: Payer: Self-pay | Admitting: *Deleted

## 2022-08-11 DIAGNOSIS — I779 Disorder of arteries and arterioles, unspecified: Secondary | ICD-10-CM

## 2022-08-17 ENCOUNTER — Ambulatory Visit (HOSPITAL_COMMUNITY)
Admission: RE | Admit: 2022-08-17 | Discharge: 2022-08-17 | Disposition: A | Discharge status: Home / Self Care | Payer: Medicare HMO | Source: Ambulatory Visit | Attending: Surgery | Admitting: Surgery

## 2022-08-17 ENCOUNTER — Ambulatory Visit: Payer: Medicare HMO | Admitting: Physician Assistant

## 2022-08-17 VITALS — BP 133/77 | HR 60 | Temp 97.6°F | Resp 20 | Ht 70.0 in | Wt 270.8 lb

## 2022-08-17 DIAGNOSIS — I6523 Occlusion and stenosis of bilateral carotid arteries: Secondary | ICD-10-CM | POA: Diagnosis not present

## 2022-08-17 DIAGNOSIS — I10 Essential (primary) hypertension: Secondary | ICD-10-CM | POA: Diagnosis not present

## 2022-08-17 DIAGNOSIS — I779 Disorder of arteries and arterioles, unspecified: Secondary | ICD-10-CM

## 2022-08-17 DIAGNOSIS — E119 Type 2 diabetes mellitus without complications: Secondary | ICD-10-CM | POA: Insufficient documentation

## 2022-08-17 DIAGNOSIS — Z87891 Personal history of nicotine dependence: Secondary | ICD-10-CM | POA: Diagnosis not present

## 2022-08-17 DIAGNOSIS — E785 Hyperlipidemia, unspecified: Secondary | ICD-10-CM | POA: Diagnosis not present

## 2022-08-17 NOTE — Progress Notes (Signed)
History of Present Illness:  Patient is a 82 y.o. year old male who presents for evaluation of carotid stenosis.    S/P post symptomatic left CEA 2007 by Dr. Oneida Alar.   Symptoms related to this stenosis include history of TIA with expressive aphasia.   The patient denies symptoms of TIA, amaurosis, or stroke.  He is medically managed on Past Medical History:  Diagnosis Date   Arthritis    Carotid artery occlusion    Coronary artery disease    Diabetes mellitus without complication ([place])    Fall down embankment Oct. 26, 2016   Hurt Right shoulder   GERD (gastroesophageal reflux disease)    History of bleeding ulcers    Hypertension    Shortness of breath dyspnea    with exertion   Stroke ([place])    4 mini strokes    Past Surgical History:  Procedure Laterality Date   CAROTID ENDARTERECTOMY  2007   Left CEA   CORONARY ARTERY BYPASS GRAFT  2001   Dr. Cyndia Bent   EYE SURGERY Right Sept.  1, 2014   Cataract   EYE SURGERY Left Sept.   27, 2014   Cataract   JOINT REPLACEMENT     TOTAL KNEE ARTHROPLASTY Left 02/25/2016   Procedure: TOTAL KNEE ARTHROPLASTY;  Surgeon: Renette Butters, MD;  Location: [facility];  Service: Orthopedics;  Laterality: Left;   TOTAL KNEE ARTHROPLASTY Right 09/29/2016   Procedure: TOTAL KNEE ARTHROPLASTY;  Surgeon: Renette Butters, MD;  Location: [facility];  Service: Orthopedics;  Laterality: Right;     Social History Social History   Tobacco Use   Smoking status: Former    Types: Cigarettes    Quit date: 07/06/1976    Years since quitting: 46.1    Passive exposure: Never   Smokeless tobacco: Former    Types: Nurse, children's Use: Never used  Substance Use Topics   Alcohol use: No   Drug use: No    Family History Family History  Problem Relation Age of Onset   Stroke Mother    Heart disease Father        Heart Disease before age 13   Heart attack Father        X's 66   Emphysema Sister    Other Brother        train accident    Cancer  Brother    Alzheimer's disease Brother     Allergies  Allergies  Allergen Reactions   Asa [Aspirin] Other (See Comments)    Causes bleeding Ulcer     Current Outpatient Medications  Medication Sig Dispense Refill   atorvastatin (LIPITOR) 40 MG tablet Take 40 mg by mouth daily.      baclofen (LIORESAL) 10 MG tablet Take 1 tablet (10 mg total) by mouth 3 (three) times daily as needed for muscle spasms. 40 each 0   lisinopril (PRINIVIL,ZESTRIL) 10 MG tablet Take 10 mg by mouth daily at 8 pm.      metFORMIN (GLUCOPHAGE) 500 MG tablet Take 1 tablet (500 mg total) by mouth 2 (two) times daily with a meal.     metoprolol (LOPRESSOR) 50 MG tablet Take 50 mg by mouth 2 (two) times daily.     Omega-3 Fatty Acids (FISH OIL) 1200 MG CAPS Take 1,200 mg by mouth 2 (two) times daily.     pantoprazole (PROTONIX) 40 MG tablet Take 40 mg by mouth daily.     pioglitazone (  ACTOS) 15 MG tablet Take 30 mg by mouth daily at 8 pm.      rivaroxaban (XARELTO) 10 MG TABS tablet Take 1 tablet (10 mg total) by mouth daily. For 30 days for DVT prophylaxis 30 tablet 0   No current facility-administered medications for this visit.    ROS:   General:  No weight loss, Fever, chills  HEENT: No recent headaches, no nasal bleeding, no visual changes, no sore throat  Neurologic: No dizziness, blackouts, seizures. No recent symptoms of stroke or mini- stroke. No recent episodes of slurred speech, or temporary blindness.  Cardiac: No recent episodes of chest pain/pressure, no shortness of breath at rest.  No shortness of breath with exertion.  Denies history of atrial fibrillation or irregular heartbeat  Vascular: No history of rest pain in feet.  No history of claudication.  No history of non-healing ulcer, No history of DVT   Pulmonary: No home oxygen, no productive cough, no hemoptysis,  No asthma or wheezing  Musculoskeletal:  [ ]$  Arthritis, [ ]$  Low back pain,  [ ]$  Joint pain  Hematologic:No history of  hypercoagulable state.  No history of easy bleeding.  No history of anemia  Gastrointestinal: No hematochezia or melena,  No gastroesophageal reflux, no trouble swallowing  Urinary: [ x] chronic Kidney disease, [ ]$  on HD - [ ]$  MWF or [ ]$  TTHS, [ ]$  Burning with urination, [ ]$  Frequent urination, [ ]$  Difficulty urinating;   Skin: No rashes  Psychological: No history of anxiety,  No history of depression   Physical Examination  Vitals:   08/17/22 1430 08/17/22 1431  BP: 137/71 133/77  Pulse: 60   Resp: 20   Temp: 97.6 F (36.4 C)   TempSrc: Temporal   SpO2: 98%   Weight: 270 lb 12.8 oz (122.8 kg)   Height: 5' 10"$  (1.778 m)     Body mass index is 38.86 kg/m.  General:  Alert and oriented, no acute distress HEENT: Normal Neck: No bruit or JVD Pulmonary: Clear to auscultation bilaterally Cardiac: Regular Rate and Rhythm without murmur Gastrointestinal: Soft, non-tender, non-distended, no mass, no scars Skin: No rash Extremity Pulses:  2+ radial pulses bilaterally Musculoskeletal: No deformity or edema  Neurologic: Upper and lower extremity motor 5/5 and symmetric  DATA:  Carotid Arterial Duplex Study  Patient Name:  Brad Day  Date of Exam:   08/17/2022 Medical Rec #: [id number]      Accession #:    [id number] Date of Birth: 01-15-1941      Patient Gender: M Patient Age:   75 years Exam Location:  [facility] Procedure:      VAS US CAROTID Referring Phys: [employer] Asiah Befort   --------------------------------------------------------------------------- -----   Indications:       Left CEA 2007. Risk Factors:      Hypertension, hyperlipidemia, Diabetes, past history of                    smoking, prior CVA. Comparison Study:  11/30/19: Bilateral 1-39%  Performing Technologist: Ralene Cork RVT    Examination Guidelines: A complete evaluation includes B-mode imaging, spectral Doppler, color Doppler, and power Doppler as needed of all  accessible portions of each vessel. Bilateral testing is considered an integral part of a complete examination. Limited examinations for reoccurring indications may be performed as noted.    Right Carotid Findings: +----------+--------+--------+--------+------------------+--------+           PSV cm/sEDV cm/sStenosisPlaque DescriptionComments +----------+--------+--------+--------+------------------+--------+ CCA  Prox  111     13                                         +----------+--------+--------+--------+------------------+--------+ CCA Mid   108     13                                         +----------+--------+--------+--------+------------------+--------+ CCA Distal108     18              calcific                   +----------+--------+--------+--------+------------------+--------+ ICA Prox  140     42      1-39%   heterogenous               +----------+--------+--------+--------+------------------+--------+ ICA Mid   100     24                                         +----------+--------+--------+--------+------------------+--------+ ICA Distal108     32                                         +----------+--------+--------+--------+------------------+--------+ ECA       177     0                                          +----------+--------+--------+--------+------------------+--------+  +----------+--------+-------+----------------+-------------------+           PSV cm/sEDV cmsDescribe        Arm Pressure (mmHG) +----------+--------+-------+----------------+-------------------+ Subclavian195     0      Multiphasic, [id number]                 +----------+--------+-------+----------------+-------------------+  +---------+--------+--+--------+-+---------+ VertebralPSV cm/s36EDV cm/s5Antegrade +---------+--------+--+--------+-+---------+     Left Carotid  Findings: +----------+--------+--------+--------+------------------+--------+           PSV cm/sEDV cm/sStenosisPlaque DescriptionComments +----------+--------+--------+--------+------------------+--------+ CCA Prox  110     16                                         +----------+--------+--------+--------+------------------+--------+ CCA Mid   120     18                                         +----------+--------+--------+--------+------------------+--------+ CCA Distal98      17              calcific                   +----------+--------+--------+--------+------------------+--------+ ICA Prox  55      14      1-39%   heterogenous               +----------+--------+--------+--------+------------------+--------+ ICA Mid   43      12                                         +----------+--------+--------+--------+------------------+--------+  ICA Distal65      20                                         +----------+--------+--------+--------+------------------+--------+ ECA       96      0                                          +----------+--------+--------+--------+------------------+--------+  +----------+--------+--------+----------------+-------------------+           PSV cm/sEDV cm/sDescribe        Arm Pressure (mmHG) +----------+--------+--------+----------------+-------------------+ [id number]     0       Multiphasic, [id number]                 +----------+--------+--------+----------------+-------------------+  +---------+--------+--+--------+--+---------+ VertebralPSV cm/s41EDV cm/s12Antegrade +---------+--------+--+--------+--+---------+      Summary: Right Carotid: Velocities in the right ICA are consistent with a 1-39% stenosis.  Left Carotid: Velocities in the left ICA are consistent with a 1-39% stenosis.               Patent CEA.  Vertebrals:  Bilateral vertebral arteries demonstrate  antegrade flow. Subclavians: Normal flow hemodynamics were seen in bilateral subclavian              arteries.    ASSESSMENT/PLAN: History of TIA S/P left CEA by Dr. Oneida Alar  2007. The carotid duplex demonstrated < 39% stenosis B ICA.    He is asymptomatic for stroke /TIA.  He is medically managed with Statin and Xarelto daily.  He states fairly active and maintains his own house hold.   I will schedule a surveillance follow up for 18 months with repeat Carotid duplex.  If he has stroke symptoms he will call 911.      Roxy Horseman PA-C [facility] Office: [phone]  MD in clinic [place]

## 2022-10-26 DIAGNOSIS — E1122 Type 2 diabetes mellitus with diabetic chronic kidney disease: Secondary | ICD-10-CM | POA: Diagnosis not present

## 2023-01-28 DIAGNOSIS — J449 Chronic obstructive pulmonary disease, unspecified: Secondary | ICD-10-CM | POA: Diagnosis not present

## 2023-01-28 DIAGNOSIS — I1310 Hypertensive heart and chronic kidney disease without heart failure, with stage 1 through stage 4 chronic kidney disease, or unspecified chronic kidney disease: Secondary | ICD-10-CM | POA: Diagnosis not present

## 2023-01-28 DIAGNOSIS — I7143 Infrarenal abdominal aortic aneurysm, without rupture: Secondary | ICD-10-CM | POA: Diagnosis not present

## 2023-01-28 DIAGNOSIS — D631 Anemia in chronic kidney disease: Secondary | ICD-10-CM | POA: Diagnosis not present

## 2023-01-28 DIAGNOSIS — I779 Disorder of arteries and arterioles, unspecified: Secondary | ICD-10-CM | POA: Diagnosis not present

## 2023-01-28 DIAGNOSIS — N184 Chronic kidney disease, stage 4 (severe): Secondary | ICD-10-CM | POA: Diagnosis not present

## 2023-01-28 DIAGNOSIS — K279 Peptic ulcer, site unspecified, unspecified as acute or chronic, without hemorrhage or perforation: Secondary | ICD-10-CM | POA: Diagnosis not present

## 2023-01-28 DIAGNOSIS — E782 Mixed hyperlipidemia: Secondary | ICD-10-CM | POA: Diagnosis not present

## 2023-01-28 DIAGNOSIS — Z6841 Body Mass Index (BMI) 40.0 and over, adult: Secondary | ICD-10-CM | POA: Diagnosis not present

## 2023-01-28 DIAGNOSIS — E1122 Type 2 diabetes mellitus with diabetic chronic kidney disease: Secondary | ICD-10-CM | POA: Diagnosis not present

## 2023-01-28 DIAGNOSIS — I2510 Atherosclerotic heart disease of native coronary artery without angina pectoris: Secondary | ICD-10-CM | POA: Diagnosis not present

## 2023-01-29 ENCOUNTER — Other Ambulatory Visit: Payer: Self-pay | Admitting: Family Medicine

## 2023-01-29 DIAGNOSIS — I7143 Infrarenal abdominal aortic aneurysm, without rupture: Secondary | ICD-10-CM

## 2023-02-01 ENCOUNTER — Ambulatory Visit (INDEPENDENT_AMBULATORY_CARE_PROVIDER_SITE_OTHER): Payer: Medicare HMO

## 2023-02-01 DIAGNOSIS — I7143 Infrarenal abdominal aortic aneurysm, without rupture: Secondary | ICD-10-CM | POA: Diagnosis not present

## 2023-07-19 DIAGNOSIS — K279 Peptic ulcer, site unspecified, unspecified as acute or chronic, without hemorrhage or perforation: Secondary | ICD-10-CM | POA: Diagnosis not present

## 2023-07-19 DIAGNOSIS — D631 Anemia in chronic kidney disease: Secondary | ICD-10-CM | POA: Diagnosis not present

## 2023-07-19 DIAGNOSIS — E782 Mixed hyperlipidemia: Secondary | ICD-10-CM | POA: Diagnosis not present

## 2023-07-19 DIAGNOSIS — E1122 Type 2 diabetes mellitus with diabetic chronic kidney disease: Secondary | ICD-10-CM | POA: Diagnosis not present

## 2023-07-19 DIAGNOSIS — I779 Disorder of arteries and arterioles, unspecified: Secondary | ICD-10-CM | POA: Diagnosis not present

## 2023-07-19 DIAGNOSIS — J449 Chronic obstructive pulmonary disease, unspecified: Secondary | ICD-10-CM | POA: Diagnosis not present

## 2023-07-19 DIAGNOSIS — I7143 Infrarenal abdominal aortic aneurysm, without rupture: Secondary | ICD-10-CM | POA: Diagnosis not present

## 2023-07-19 DIAGNOSIS — N184 Chronic kidney disease, stage 4 (severe): Secondary | ICD-10-CM | POA: Diagnosis not present

## 2023-07-19 DIAGNOSIS — I2510 Atherosclerotic heart disease of native coronary artery without angina pectoris: Secondary | ICD-10-CM | POA: Diagnosis not present

## 2023-07-19 DIAGNOSIS — I1310 Hypertensive heart and chronic kidney disease without heart failure, with stage 1 through stage 4 chronic kidney disease, or unspecified chronic kidney disease: Secondary | ICD-10-CM | POA: Diagnosis not present

## 2024-01-12 DIAGNOSIS — J449 Chronic obstructive pulmonary disease, unspecified: Secondary | ICD-10-CM | POA: Diagnosis not present

## 2024-01-12 DIAGNOSIS — N184 Chronic kidney disease, stage 4 (severe): Secondary | ICD-10-CM | POA: Diagnosis not present

## 2024-01-12 DIAGNOSIS — I2510 Atherosclerotic heart disease of native coronary artery without angina pectoris: Secondary | ICD-10-CM | POA: Diagnosis not present

## 2024-01-12 DIAGNOSIS — I1310 Hypertensive heart and chronic kidney disease without heart failure, with stage 1 through stage 4 chronic kidney disease, or unspecified chronic kidney disease: Secondary | ICD-10-CM | POA: Diagnosis not present

## 2024-01-12 DIAGNOSIS — D631 Anemia in chronic kidney disease: Secondary | ICD-10-CM | POA: Diagnosis not present

## 2024-01-12 DIAGNOSIS — I7143 Infrarenal abdominal aortic aneurysm, without rupture: Secondary | ICD-10-CM | POA: Diagnosis not present

## 2024-01-12 DIAGNOSIS — Z0000 Encounter for general adult medical examination without abnormal findings: Secondary | ICD-10-CM | POA: Diagnosis not present

## 2024-01-12 DIAGNOSIS — I779 Disorder of arteries and arterioles, unspecified: Secondary | ICD-10-CM | POA: Diagnosis not present

## 2024-01-12 DIAGNOSIS — E1122 Type 2 diabetes mellitus with diabetic chronic kidney disease: Secondary | ICD-10-CM | POA: Diagnosis not present
